# Patient Record
Sex: Male | Born: 1950
Health system: Southern US, Community
[De-identification: ages and names within clinical notes are randomized; demographics above are authoritative.]

## PROBLEM LIST (undated history)

## (undated) DIAGNOSIS — N529 Male erectile dysfunction, unspecified: Secondary | ICD-10-CM

## (undated) DIAGNOSIS — I10 Essential (primary) hypertension: Secondary | ICD-10-CM

## (undated) DIAGNOSIS — E785 Hyperlipidemia, unspecified: Secondary | ICD-10-CM

## (undated) HISTORY — DX: Hyperlipidemia, unspecified: E78.5

## (undated) HISTORY — DX: Male erectile dysfunction, unspecified: N52.9

## (undated) HISTORY — DX: Essential (primary) hypertension: I10

## (undated) HISTORY — PX: SHOULDER SURGERY: SHX246

---

## 2007-06-13 ENCOUNTER — Ambulatory Visit: Payer: Self-pay | Admitting: Family Medicine

## 2008-01-12 HISTORY — PX: UMBILICAL HERNIA REPAIR: SHX196

## 2008-04-11 ENCOUNTER — Encounter: Admission: RE | Admit: 2008-04-11 | Discharge: 2008-04-11 | Payer: Self-pay | Admitting: Surgery

## 2010-06-11 ENCOUNTER — Encounter: Payer: Self-pay | Admitting: Physician Assistant

## 2011-04-16 ENCOUNTER — Encounter: Payer: Self-pay | Admitting: Gastroenterology

## 2011-06-25 ENCOUNTER — Encounter: Payer: Self-pay | Admitting: Family Medicine

## 2011-06-25 ENCOUNTER — Other Ambulatory Visit: Payer: Self-pay

## 2011-06-25 ENCOUNTER — Ambulatory Visit (INDEPENDENT_AMBULATORY_CARE_PROVIDER_SITE_OTHER): Payer: BC Managed Care – PPO | Admitting: Family Medicine

## 2011-06-25 VITALS — BP 130/82 | HR 68 | Wt 188.0 lb

## 2011-06-25 DIAGNOSIS — M25511 Pain in right shoulder: Secondary | ICD-10-CM

## 2011-06-25 DIAGNOSIS — M25519 Pain in unspecified shoulder: Secondary | ICD-10-CM

## 2011-06-25 MED ORDER — CARISOPRODOL 350 MG PO TABS: 350.0000 mg | ORAL_TABLET | Freq: Four times a day (QID) | ORAL | Status: AC | PRN

## 2011-06-25 NOTE — Telephone Encounter (Signed)
Called soma in per jcl 

## 2011-06-25 NOTE — Patient Instructions (Addendum)
Heat to your shoulder area for 20 minutes 3 times a day. 4 ibuprofen 3 times per day. Will give you a muscle relaxer mainly to use at night

## 2011-06-25 NOTE — Progress Notes (Signed)
  Subjective:    Patient ID: Tony Hodges, male    DOB: 01-Mar-1950, 61 y.o.   MRN: [id number]  HPI He has a three-week history of right shoulder pain that he notes started after he was carrying a computer bag. The pain has continued. It bothers him more in the mornings when he gets up and he gives a 4/10. He has had previous difficulty with right shoulder pain and was last seen here in 2009. An injection was given ice and this and he got better   Review of Systems     Objective:   Physical Exam No palpable tenderness noted to the shoulder or upper back area. Normal motion of the shoulder. No laxity noted. Hawkins and Neer test is negative. Negative sulcus sign.      Assessment & Plan:   1. Right shoulder pain  carisoprodol (SOMA) 350 MG tablet  Heat to your shoulder area for 20 minutes 3 times a day. 4 ibuprofen 3 times per day. Will give you a muscle relaxer mainly to use at night If no better after approximately 10 days he will call me

## 2012-02-21 ENCOUNTER — Encounter: Payer: Self-pay | Admitting: Gastroenterology

## 2012-03-14 ENCOUNTER — Encounter: Payer: Self-pay | Admitting: Gastroenterology

## 2012-03-14 ENCOUNTER — Ambulatory Visit (AMBULATORY_SURGERY_CENTER): Payer: BC Managed Care – PPO | Admitting: *Deleted

## 2012-03-14 VITALS — Ht 72.0 in | Wt 195.0 lb

## 2012-03-14 DIAGNOSIS — Z1211 Encounter for screening for malignant neoplasm of colon: Secondary | ICD-10-CM

## 2012-03-14 MED ORDER — NA SULFATE-K SULFATE-MG SULF 17.5-3.13-1.6 GM/177ML PO SOLN: ORAL | Status: DC

## 2012-03-30 ENCOUNTER — Encounter: Payer: BC Managed Care – PPO | Admitting: Gastroenterology

## 2012-04-06 ENCOUNTER — Encounter: Payer: Self-pay | Admitting: *Deleted

## 2012-04-07 ENCOUNTER — Encounter: Payer: BC Managed Care – PPO | Admitting: Gastroenterology

## 2012-04-27 ENCOUNTER — Encounter: Payer: Self-pay | Admitting: Gastroenterology

## 2012-04-27 ENCOUNTER — Other Ambulatory Visit: Payer: Self-pay | Admitting: Gastroenterology

## 2012-04-27 ENCOUNTER — Ambulatory Visit (AMBULATORY_SURGERY_CENTER): Payer: BC Managed Care – PPO | Admitting: Gastroenterology

## 2012-04-27 VITALS — BP 133/83 | HR 74 | Temp 97.8°F | Resp 21 | Ht 72.0 in | Wt 195.0 lb

## 2012-04-27 DIAGNOSIS — Z1211 Encounter for screening for malignant neoplasm of colon: Secondary | ICD-10-CM

## 2012-04-27 HISTORY — PX: COLONOSCOPY: SHX174

## 2012-04-27 MED ORDER — SODIUM CHLORIDE 0.9 % IV SOLN: 500.0000 mL | INTRAVENOUS | Status: DC

## 2012-04-27 NOTE — Progress Notes (Signed)
Patient did not experience any of the following events: a burn prior to discharge; a fall within the facility; wrong site/side/patient/procedure/implant event; or a hospital transfer or hospital admission upon discharge from the facility. (G8907) Patient did not have preoperative order for IV antibiotic SSI prophylaxis. (G8918)  

## 2012-04-27 NOTE — Op Note (Signed)
[facility] [address] [place], [address]   COLONOSCOPY PROCEDURE REPORT  PATIENT: Tony Hodges, Tony Hodges  MR#: [id number] BIRTHDATE: March 12, 1950 , 61  yrs. old GENDER: Male ENDOSCOPIST: Louis Meckel, MD REFERRED [id number] Christell Constant, M.D. PROCEDURE DATE:  04/27/2012 PROCEDURE:   Colonoscopy, diagnostic ASA CLASS:   Class II INDICATIONS: MEDICATIONS: MAC sedation, administered by CRNA and propofol (Diprivan) 150mg  IV  DESCRIPTION OF PROCEDURE:   After the risks benefits and alternatives of the procedure were thoroughly explained, informed consent was obtained.  A digital rectal exam revealed no abnormalities of the rectum.   The LB [id number]  endoscope was introduced through the anus and advanced to the cecum, which was identified by both the appendix and ileocecal valve. No adverse events experienced.   The quality of the prep was Suprep excellent The instrument was then slowly withdrawn as the colon was fully examined.      COLON FINDINGS: A normal appearing cecum, ileocecal valve, and appendiceal orifice were identified.  The ascending, hepatic flexure, transverse, splenic flexure, descending, sigmoid colon and rectum appeared unremarkable.  No polyps or cancers were seen. Retroflexed views revealed no abnormalities. The time to cecum=2 minutes 12 seconds.  Withdrawal time=6 minutes 05 seconds.  The scope was withdrawn and the procedure completed. COMPLICATIONS: There were no complications.  ENDOSCOPIC IMPRESSION: Normal colon  RECOMMENDATIONS: Continue current colorectal screening recommendations for "routine risk" patients with a repeat colonoscopy in 10 years.   eSigned:  Louis Meckel, MD 04/27/2012 10:13 AM   cc:

## 2012-04-27 NOTE — Patient Instructions (Addendum)
Impressions/recommendations:  Normal colon  Repeat colonoscopy in 10 years.  YOU HAD AN ENDOSCOPIC PROCEDURE TODAY AT THE [facility]: Refer to the procedure report that was given to you for any specific questions about what was found during the examination.  If the procedure report does not answer your questions, please call your gastroenterologist to clarify.  If you requested that your care partner not be given the details of your procedure findings, then the procedure report has been included in a sealed envelope for you to review at your convenience later.  YOU SHOULD EXPECT: Some feelings of bloating in the abdomen. Passage of more gas than usual.  Walking can help get rid of the air that was put into your GI tract during the procedure and reduce the bloating. If you had a lower endoscopy (such as a colonoscopy or flexible sigmoidoscopy) you may notice spotting of blood in your stool or on the toilet paper. If you underwent a bowel prep for your procedure, then you may not have a normal bowel movement for a few days.  DIET: Your first meal following the procedure should be a light meal and then it is ok to progress to your normal diet.  A half-sandwich or bowl of soup is an example of a good first meal.  Heavy or fried foods are harder to digest and may make you feel nauseous or bloated.  Likewise meals heavy in dairy and vegetables can cause extra gas to form and this can also increase the bloating.  Drink plenty of fluids but you should avoid alcoholic beverages for 24 hours.  ACTIVITY: Your care partner should take you home directly after the procedure.  You should plan to take it easy, moving slowly for the rest of the day.  You can resume normal activity the day after the procedure however you should NOT DRIVE or use heavy machinery for 24 hours (because of the sedation medicines used during the test).    SYMPTOMS TO REPORT IMMEDIATELY: A gastroenterologist can be reached at  any hour.  During normal business hours, 8:30 AM to 5:00 PM Monday through Friday, call [phone].  After hours and on weekends, please call the GI answering service at [phone] who will take a message and have the physician on call contact you.   Following lower endoscopy (colonoscopy or flexible sigmoidoscopy):  Excessive amounts of blood in the stool  Significant tenderness or worsening of abdominal pains  Swelling of the abdomen that is new, acute  Fever of 100F or higher   FOLLOW UP: If any biopsies were taken you will be contacted by phone or by letter within the next 1-3 weeks.  Call your gastroenterologist if you have not heard about the biopsies in 3 weeks.  Our staff will call the home number listed on your records the next business day following your procedure to check on you and address any questions or concerns that you may have at that time regarding the information given to you following your procedure. This is a courtesy call and so if there is no answer at the home number and we have not heard from you through the emergency physician on call, we will assume that you have returned to your regular daily activities without incident.  SIGNATURES/CONFIDENTIALITY: You and/or your care partner have signed paperwork which will be entered into your electronic medical record.  These signatures attest to the fact that that the information above on your After Visit Summary has been   reviewed and is understood.  Full responsibility of the confidentiality of this discharge information lies with you and/or your care-partner. 

## 2012-04-27 NOTE — Progress Notes (Signed)
Lidocaine-40mg IV prior to Propofol InductionPropofol given over incremental dosages 

## 2012-05-01 ENCOUNTER — Telehealth: Payer: Self-pay | Admitting: *Deleted

## 2012-05-01 NOTE — Telephone Encounter (Signed)
  Follow up Call-  Call back number 04/27/2012  Post procedure Call Back phone  # [phone] hm  Permission to leave phone message Yes     Patient questions:  Do you have a fever, pain , or abdominal swelling? no Pain Score  0 *  Have you tolerated food without any problems? yes  Have you been able to return to your normal activities? yes  Do you have any questions about your discharge instructions: Diet   no Medications  no Follow up visit  no  Do you have questions or concerns about your Care? no  Actions: * If pain score is 4 or above: No action needed, pain <4.

## 2012-05-19 ENCOUNTER — Ambulatory Visit: Payer: Self-pay | Admitting: General Practice

## 2012-05-19 ENCOUNTER — Ambulatory Visit: Payer: Self-pay | Admitting: Family Medicine

## 2012-05-25 ENCOUNTER — Encounter: Payer: Self-pay | Admitting: Family Medicine

## 2012-05-25 ENCOUNTER — Ambulatory Visit (INDEPENDENT_AMBULATORY_CARE_PROVIDER_SITE_OTHER): Payer: BC Managed Care – PPO | Admitting: Family Medicine

## 2012-05-25 VITALS — BP 130/80 | HR 70 | Temp 97.7°F | Ht 72.0 in | Wt 187.0 lb

## 2012-05-25 DIAGNOSIS — E785 Hyperlipidemia, unspecified: Secondary | ICD-10-CM

## 2012-05-25 DIAGNOSIS — E1169 Type 2 diabetes mellitus with other specified complication: Secondary | ICD-10-CM | POA: Insufficient documentation

## 2012-05-25 DIAGNOSIS — E119 Type 2 diabetes mellitus without complications: Secondary | ICD-10-CM

## 2012-05-25 DIAGNOSIS — I10 Essential (primary) hypertension: Secondary | ICD-10-CM | POA: Insufficient documentation

## 2012-05-25 LAB — BASIC METABOLIC PANEL WITH GFR
BUN: 16 mg/dL (ref 6–23)
CO2: 27 mEq/L (ref 19–32)
Calcium: 9.1 mg/dL (ref 8.4–10.5)
Chloride: 102 mEq/L (ref 96–112)
Creat: 1.09 mg/dL (ref 0.50–1.35)
GFR, Est African American: 84 mL/min
GFR, Est Non African American: 73 mL/min
Glucose, Bld: 130 mg/dL — ABNORMAL HIGH (ref 70–99)
Potassium: 5 mEq/L (ref 3.5–5.3)
Sodium: 136 mEq/L (ref 135–145)

## 2012-05-25 LAB — HEPATIC FUNCTION PANEL
ALT: 14 U/L (ref 0–53)
AST: 16 U/L (ref 0–37)
Albumin: 4.3 g/dL (ref 3.5–5.2)
Alkaline Phosphatase: 53 U/L (ref 39–117)
Bilirubin, Direct: 0.1 mg/dL (ref 0.0–0.3)
Indirect Bilirubin: 0.4 mg/dL (ref 0.0–0.9)
Total Bilirubin: 0.5 mg/dL (ref 0.3–1.2)
Total Protein: 6.8 g/dL (ref 6.0–8.3)

## 2012-05-25 LAB — POCT GLYCOSYLATED HEMOGLOBIN (HGB A1C): Hemoglobin A1C: 6.9

## 2012-05-25 MED ORDER — SAXAGLIPTIN-METFORMIN ER 2.5-1000 MG PO TB24: 2.0000 | ORAL_TABLET | Freq: Every day | ORAL | Status: DC

## 2012-05-25 NOTE — Patient Instructions (Addendum)
      Dr Woodroe Mode Recommendations  Diet and Exercise discussed with patient.  For nutrition information, I recommend books:  1).Eat to Live by Dr Monico Hoar. 2).Prevent and Reverse Heart Disease by Dr Suzzette Righter. 3) Dr Lequita Asal Reversing Diabetes.  Www.PCRM.org  Exercise recommendations are:  If unable to walk, then the patient can exercise in a chair 3 times a day. By flapping arms like a bird gently and raising legs outwards to the front.  If ambulatory, the patient can go for walks for 30 minutes 3 times a week. Then increase the intensity and duration as tolerated.  Goal is to try to attain exercise frequency to 6 times a week.  If applicable: Best to perform resistance exercises (machines or weights) 2 days a week and cardio type exercises 3 days per week.

## 2012-05-25 NOTE — Progress Notes (Signed)
Patient ID: Tony Hodges, male   DOB: Oct 25, 1950, 62 y.o.   MRN: [id number] SUBJECTIVE: HPI: Patient is here for follow up of Diabetes Mellitus/HTN/hyperlipidemia  .Symptoms of DM:has had no Nocturia ,deniesUrinary Frequency ,denies Blurred vision ,deniesDizziness,denies.Dysuria,deniesparesthesias, deniesextremity pain or ulcers.Marland Kitchendenieschest pain. .has hadan annual eye exam. do check the feet. doescheck CBGs. Average CBG:________.Marland Kitchen deniesto episodes of hypoglycemia. doeshave an emergency hypoglycemic plan. admits toCompliance with medications. deniesProblems with medications.   PMH/PSH: reviewed/updated in Epic  SH/FH: reviewed/updated in Epic  Allergies: reviewed/updated in Epic  Medications: reviewed/updated in Epic  Immunizations: reviewed/updated in Epic  ROS: As above in the HPI. All other systems are stable or negative.  OBJECTIVE: APPEARANCE:  Patient in no acute distress.The patient appeared well nourished and normally developed. Acyanotic. Waist: VITAL SIGNS:  SKIN: warm and  Dry without overt rashes, tattoos and scars  HEAD and Neck: without JVD, Head and scalp: normal Eyes:No scleral icterus. Fundi normal, eye movements normal. Ears: Auricle normal, canal normal, Tympanic membranes normal, insufflation normal. Nose: normal Throat: normal Neck & thyroid: normal  CHEST & LUNGS: Chest wall: normal Lungs: Clear  CVS: Reveals the PMI to be normally located. Regular rhythm, First and Second Heart sounds are normal,  absence of murmurs, rubs or gallops. Peripheral vasculature: Radial pulses: normal Dorsal pedis pulses: normal Posterior pulses: normal  ABDOMEN:  Appearance: normal Benign,, no organomegaly, no masses, no Abdominal Aortic enlargement. No Guarding , no rebound. No Bruits. Bowel sounds: normal  RECTAL: N/A GU: N/A  EXTREMETIES: nonedematous. Both Femoral and Pedal pulses are normal.  MUSCULOSKELETAL:  Spine: normal Joints:  intact  NEUROLOGIC: oriented to time,place and person; nonfocal. Strength is normal Sensory is normal Reflexes are normal Cranial Nerves are normal.  ASSESSMENT:  HTN (hypertension) - Plan: BASIC METABOLIC PANEL WITH GFR  HLD (hyperlipidemia) - Plan: NMR Lipoprofile with Lipids, Hepatic function panel  DM (diabetes mellitus) - Plan: POCT glycosylated hemoglobin (Hb A1C), Saxagliptin-Metformin (KOMBIGLYZE XR) 2.05-998 MG TB24, CANCELED: POCT UA - Microalbumin   PLAN:       Dr Woodroe Mode Recommendations  Diet and Exercise discussed with patient.  For nutrition information, I recommend books:  1).Eat to Live by Dr Monico Hoar. 2).Prevent and Reverse Heart Disease by Dr Suzzette Righter. 3) Dr Lequita Asal Reversing Diabetes.  Www.PCRM.org  Exercise recommendations are:  If unable to walk, then the patient can exercise in a chair 3 times a day. By flapping arms like a bird gently and raising legs outwards to the front.  If ambulatory, the patient can go for walks for 30 minutes 3 times a week. Then increase the intensity and duration as tolerated.  Goal is to try to attain exercise frequency to 6 times a week.  If applicable: Best to perform resistance exercises (machines or weights) 2 days a week and cardio type exercises 3 days per week.  Orders Placed This Encounter  Procedures  . NMR Lipoprofile with Lipids  . BASIC METABOLIC PANEL WITH GFR  . Hepatic function panel  . POCT glycosylated hemoglobin (Hb A1C)   Results for orders placed in visit on 05/25/12  POCT GLYCOSYLATED HEMOGLOBIN (HGB A1C)      Result Value Range   Hemoglobin A1C 6.9 %     Meds ordered this encounter  Medications  . ACCU-CHEK AVIVA PLUS test strip    Sig:   . Saxagliptin-Metformin (KOMBIGLYZE XR) 2.05-998 MG TB24    Sig: Take 2 tablets by mouth at bedtime.    Dispense:  30 tablet  Refill:  11   RTC 3 months  Peterson Mathey P. [place]esto Charon, M.D.

## 2012-05-31 LAB — NMR LIPOPROFILE WITH LIPIDS
Cholesterol, Total: 96 mg/dL (ref <200)
HDL Particle Number: 30.2 umol/L — ABNORMAL LOW (ref >=30.5)
HDL Size: 8.6 nm — ABNORMAL LOW (ref >=9.2)
HDL-C: 35 mg/dL — ABNORMAL LOW (ref >=40)
LDL (calc): 44 mg/dL (ref <100)
LDL Particle Number: 732 nmol/L (ref <1000)
LDL Size: 20.6 nm (ref >20.5)
LP-IR Score: 54 — ABNORMAL HIGH (ref <=45)
Large HDL-P: 2 umol/L — ABNORMAL LOW (ref >=4.8)
Large VLDL-P: 1.3 nmol/L (ref <=2.7)
Small LDL Particle Number: 335 nmol/L (ref <=527)
Triglycerides: 83 mg/dL (ref <150)
VLDL Size: 45.9 nm (ref <=46.6)

## 2012-06-03 NOTE — Progress Notes (Signed)
Quick Note:  Lab result close to goal. No change in Medications for now. No Change in plans and follow up. To get the HGBA1C to goal, dietary changes would achieve that. Otherwise at the next visit we will have to increase the actos. ______

## 2012-06-06 ENCOUNTER — Encounter: Payer: Self-pay | Admitting: *Deleted

## 2012-06-20 ENCOUNTER — Other Ambulatory Visit: Payer: Self-pay | Admitting: Nurse Practitioner

## 2012-08-25 ENCOUNTER — Ambulatory Visit: Payer: BC Managed Care – PPO | Admitting: Family Medicine

## 2012-08-28 ENCOUNTER — Other Ambulatory Visit: Payer: Self-pay | Admitting: Nurse Practitioner

## 2012-08-29 NOTE — Telephone Encounter (Signed)
Last seen 05/25/12  FPW  Last glucose level 05/25/12

## 2012-09-05 ENCOUNTER — Encounter: Payer: Self-pay | Admitting: Family Medicine

## 2012-09-05 ENCOUNTER — Ambulatory Visit (INDEPENDENT_AMBULATORY_CARE_PROVIDER_SITE_OTHER): Payer: BC Managed Care – PPO | Admitting: Family Medicine

## 2012-09-05 VITALS — BP 127/81 | HR 66 | Temp 98.9°F | Wt 186.6 lb

## 2012-09-05 DIAGNOSIS — N529 Male erectile dysfunction, unspecified: Secondary | ICD-10-CM

## 2012-09-05 DIAGNOSIS — E119 Type 2 diabetes mellitus without complications: Secondary | ICD-10-CM

## 2012-09-05 DIAGNOSIS — I10 Essential (primary) hypertension: Secondary | ICD-10-CM

## 2012-09-05 DIAGNOSIS — E785 Hyperlipidemia, unspecified: Secondary | ICD-10-CM

## 2012-09-05 LAB — POCT UA - MICROALBUMIN: Microalbumin Ur, POC: NEGATIVE mg/L

## 2012-09-05 LAB — POCT GLYCOSYLATED HEMOGLOBIN (HGB A1C): Hemoglobin A1C: 6.7

## 2012-09-05 MED ORDER — PIOGLITAZONE HCL 15 MG PO TABS: ORAL_TABLET | ORAL | Status: DC

## 2012-09-05 MED ORDER — ATORVASTATIN CALCIUM 40 MG PO TABS: 40.0000 mg | ORAL_TABLET | Freq: Every day | ORAL | Status: DC

## 2012-09-05 MED ORDER — AMLODIPINE BESY-BENAZEPRIL HCL 5-10 MG PO CAPS: ORAL_CAPSULE | ORAL | Status: DC

## 2012-09-05 NOTE — Patient Instructions (Addendum)
      Dr Jereld Presti's Recommendations  For nutrition information, I recommend books:  1).Eat to Live by Dr Joel Fuhrman. 2).Prevent and Reverse Heart Disease by Dr Caldwell Esselstyn. 3) Dr Neal Barnard's Book:  Program to Reverse Diabetes  Exercise recommendations are:  If unable to walk, then the patient can exercise in a chair 3 times a day. By flapping arms like a bird gently and raising legs outwards to the front.  If ambulatory, the patient can go for walks for 30 minutes 3 times a week. Then increase the intensity and duration as tolerated.  Goal is to try to attain exercise frequency to 5 times a week.  If applicable: Best to perform resistance exercises (machines or weights) 2 days a week and cardio type exercises 3 days per week.  

## 2012-09-05 NOTE — Progress Notes (Signed)
Patient ID: Tony Hodges, male   DOB: Oct 14, 1950, 62 y.o.   MRN: [id number] SUBJECTIVE: CC: Chief Complaint  Patient presents with  . Follow-up    3 month follow up   . Medication Refill    needs refill on all meds     HPI: Patient is here for follow up of Diabetes Mellitus/HTN/HLD/ED Symptoms evaluated: Denies Nocturia ,Denies Urinary Frequency , denies Blurred vision ,deniesDizziness,denies.Dysuria,denies paresthesias, denies extremity pain or ulcers.Marland Kitchendenies chest pain. has had an annual eye exam. do check the feet. Does check CBGs. Average WUJ:[id number] Denies episodes of hypoglycemia. Does have an emergency hypoglycemic plan. admits toCompliance with medications. Denies Problems with medications.  Past Medical History  Diagnosis Date  . Erectile dysfunction   . Hyperlipidemia   . Hypertension   . Diabetes mellitus    Past Surgical History  Procedure Laterality Date  . Umbilical hernia repair  2010   History   Social History  . Marital Status: Married    Spouse Name: N/A    Number of Children: N/A  . Years of Education: N/A   Occupational History  . Not on file.   Social History Main Topics  . Smoking status: Former Smoker    Quit date: 09/05/1977  . Smokeless tobacco: Never Used  . Alcohol Use: 1.2 oz/week    2 Glasses of wine per week  . Drug Use: No  . Sexual Activity: Not on file   Other Topics Concern  . Not on file   Social History Narrative  . No narrative on file   Family History  Problem Relation Age of Onset  . Colon cancer Neg Hx   . Esophageal cancer Neg Hx   . Rectal cancer Neg Hx   . Stomach cancer Neg Hx    Current Outpatient Prescriptions on File Prior to Visit  Medication Sig Dispense Refill  . ACCU-CHEK AVIVA PLUS test strip       . amLODipine-benazepril (LOTREL) 5-10 MG per capsule TAKE ONE CAPSULE BY MOUTH EVERY DAY  30 capsule  0  . aspirin (LONGS ADULT LOW STRENGTH ASA) 81 MG EC tablet Take 81 mg by mouth daily.         Marland Kitchen atorvastatin (LIPITOR) 40 MG tablet Take 40 mg by mouth at bedtime.        . pioglitazone (ACTOS) 15 MG tablet TAKE 1 TABLET BY MOUTH DAILY  30 tablet  0  . Saxagliptin-Metformin (KOMBIGLYZE XR) 2.05-998 MG TB24 Take 2 tablets by mouth at bedtime.  30 tablet  11   No current facility-administered medications on file prior to visit.   No Known Allergies  There is no immunization history on file for this patient. Prior to Admission medications   Medication Sig Start Date End Date Taking? Authorizing Provider  ACCU-CHEK AVIVA PLUS test strip  03/06/12  Yes Historical Provider, MD  amLODipine-benazepril (LOTREL) 5-10 MG per capsule TAKE ONE CAPSULE BY MOUTH EVERY DAY 08/28/12  Yes Ileana Ladd, MD  aspirin (LONGS ADULT LOW STRENGTH ASA) 81 MG EC tablet Take 81 mg by mouth daily.     Yes Historical Provider, MD  atorvastatin (LIPITOR) 40 MG tablet Take 40 mg by mouth at bedtime.     Yes Historical Provider, MD  pioglitazone (ACTOS) 15 MG tablet TAKE 1 TABLET BY MOUTH DAILY 08/28/12  Yes Ileana Ladd, MD  Saxagliptin-Metformin (KOMBIGLYZE XR) 2.05-998 MG TB24 Take 2 tablets by mouth at bedtime. 05/25/12  Yes Ileana Ladd, MD  ROS: As above in the HPI. All other systems are stable or negative.  OBJECTIVE: APPEARANCE:  Patient in no acute distress.The patient appeared well nourished and normally developed. Acyanotic. Waist: VITAL SIGNS:BP 127/81  Pulse 66  Temp(Src) 98.9 F (37.2 C) (Oral)  Wt 186 lb 9.6 oz (84.641 kg)  BMI 25.3 kg/m2   SKIN: warm and  Dry without overt rashes, tattoos and scars  HEAD and Neck: without JVD, Head and scalp: normal Eyes:No scleral icterus. Fundi normal, eye movements normal. Ears: Auricle normal, canal normal, Tympanic membranes normal, insufflation normal. Nose: normal Throat: normal Neck & thyroid: normal  CHEST & LUNGS: Chest wall: normal Lungs: Clear  CVS: Reveals the PMI to be normally located. Regular rhythm, First and Second  Heart sounds are normal,  absence of murmurs, rubs or gallops. Peripheral vasculature: Radial pulses: normal Dorsal pedis pulses: normal Posterior pulses: normal  ABDOMEN:  Appearance: normal Benign, no organomegaly, no masses, no Abdominal Aortic enlargement. No Guarding , no rebound. No Bruits. Bowel sounds: normal  RECTAL: N/A GU: N/A  EXTREMETIES: nonedematous.  MUSCULOSKELETAL:  Spine: normal Joints: intact  NEUROLOGIC: oriented to time,place and person; nonfocal. Strength is normal Sensory is normal Reflexes are normal Cranial Nerves are normal.  ASSESSMENT: Diabetes mellitus - Plan: CMP14+EGFR, POCT glycosylated hemoglobin (Hb A1C), POCT UA - Microalbumin, pioglitazone (ACTOS) 15 MG tablet  Erectile dysfunction  HLD (hyperlipidemia) - Plan: CMP14+EGFR, NMR, lipoprofile, atorvastatin (LIPITOR) 40 MG tablet  HTN (hypertension) - Plan: CMP14+EGFR, amLODipine-benazepril (LOTREL) 5-10 MG per capsule   PLAN: Orders Placed This Encounter  Procedures  . CMP14+EGFR  . NMR, lipoprofile  . POCT glycosylated hemoglobin (Hb A1C)  . POCT UA - Microalbumin   Meds ordered this encounter  Medications  . pioglitazone (ACTOS) 15 MG tablet    Sig: TAKE 1 TABLET BY MOUTH DAILY    Dispense:  30 tablet    Refill:  11    3RD REQUEST  . amLODipine-benazepril (LOTREL) 5-10 MG per capsule    Sig: TAKE ONE CAPSULE BY MOUTH EVERY DAY    Dispense:  30 capsule    Refill:  11    Due for office visit  . atorvastatin (LIPITOR) 40 MG tablet    Sig: Take 1 tablet (40 mg total) by mouth at bedtime.    Dispense:  30 tablet    Refill:  11        Dr Woodroe Mode Recommendations  For nutrition information, I recommend books:  1).Eat to Live by Dr Monico Hoar. 2).Prevent and Reverse Heart Disease by Dr Suzzette Righter. 3) Dr Katherina Right Book:  Program to Reverse Diabetes  Exercise recommendations are:  If unable to walk, then the patient can exercise in a chair 3 times  a day. By flapping arms like a bird gently and raising legs outwards to the front.  If ambulatory, the patient can go for walks for 30 minutes 3 times a week. Then increase the intensity and duration as tolerated.  Goal is to try to attain exercise frequency to 5 times a week.  If applicable: Best to perform resistance exercises (machines or weights) 2 days a week and cardio type exercises 3 days per week.  Return in about 4 months (around 01/05/2013) for Recheck medical problems.  Jaxan Michel P. [place] Charon, M.D.

## 2012-09-07 LAB — CMP14+EGFR
ALT: 17 IU/L (ref 0–44)
AST: 16 IU/L (ref 0–40)
Albumin/Globulin Ratio: 1.8 (ref 1.1–2.5)
Albumin: 4.7 g/dL (ref 3.6–4.8)
Alkaline Phosphatase: 67 IU/L (ref 39–117)
BUN/Creatinine Ratio: 11 (ref 10–22)
BUN: 11 mg/dL (ref 8–27)
CO2: 27 mmol/L (ref 18–29)
Calcium: 9.8 mg/dL (ref 8.6–10.2)
Chloride: 98 mmol/L (ref 97–108)
Creatinine, Ser: 0.98 mg/dL (ref 0.76–1.27)
GFR calc Af Amer: 96 mL/min/{1.73_m2} (ref >59)
GFR calc non Af Amer: 83 mL/min/{1.73_m2} (ref >59)
Globulin, Total: 2.6 g/dL (ref 1.5–4.5)
Glucose: 112 mg/dL — ABNORMAL HIGH (ref 65–99)
Potassium: 4.8 mmol/L (ref 3.5–5.2)
Sodium: 141 mmol/L (ref 134–144)
Total Bilirubin: 0.4 mg/dL (ref 0.0–1.2)
Total Protein: 7.3 g/dL (ref 6.0–8.5)

## 2012-09-07 LAB — NMR, LIPOPROFILE
Cholesterol: 106 mg/dL (ref <200)
HDL Cholesterol by NMR: 47 mg/dL (ref >=40)
HDL Particle Number: 32.7 umol/L (ref >=30.5)
LDL Particle Number: 740 nmol/L (ref <1000)
LDL Size: 19.9 nm — ABNORMAL LOW (ref >20.5)
LDLC SERPL CALC-MCNC: 44 mg/dL (ref <100)
LP-IR Score: 49 — ABNORMAL HIGH (ref <=45)
Small LDL Particle Number: 573 nmol/L — ABNORMAL HIGH (ref <=527)
Triglycerides by NMR: 77 mg/dL (ref <150)

## 2012-09-26 ENCOUNTER — Other Ambulatory Visit: Payer: Self-pay | Admitting: Nurse Practitioner

## 2012-10-02 ENCOUNTER — Other Ambulatory Visit: Payer: Self-pay | Admitting: Family Medicine

## 2012-10-04 ENCOUNTER — Other Ambulatory Visit: Payer: Self-pay | Admitting: Family Medicine

## 2012-11-16 ENCOUNTER — Other Ambulatory Visit: Payer: Self-pay

## 2013-01-09 ENCOUNTER — Ambulatory Visit (INDEPENDENT_AMBULATORY_CARE_PROVIDER_SITE_OTHER): Payer: BC Managed Care – PPO | Admitting: Family Medicine

## 2013-01-09 ENCOUNTER — Encounter: Payer: Self-pay | Admitting: Family Medicine

## 2013-01-09 VITALS — BP 117/71 | HR 65 | Temp 97.8°F | Ht 72.0 in | Wt 188.8 lb

## 2013-01-09 DIAGNOSIS — N529 Male erectile dysfunction, unspecified: Secondary | ICD-10-CM

## 2013-01-09 DIAGNOSIS — E119 Type 2 diabetes mellitus without complications: Secondary | ICD-10-CM

## 2013-01-09 DIAGNOSIS — I10 Essential (primary) hypertension: Secondary | ICD-10-CM

## 2013-01-09 DIAGNOSIS — E785 Hyperlipidemia, unspecified: Secondary | ICD-10-CM

## 2013-01-09 DIAGNOSIS — Z23 Encounter for immunization: Secondary | ICD-10-CM

## 2013-01-09 LAB — POCT GLYCOSYLATED HEMOGLOBIN (HGB A1C): Hemoglobin A1C: 6.6

## 2013-01-09 NOTE — Progress Notes (Signed)
Patient ID: Tony Hodges, male   DOB: 11/08/1950, 63 y.o.   MRN: [id number] SUBJECTIVE: CC: Chief Complaint  Patient presents with  . Follow-up    4 month follow up     HPI: Patient is here for follow up of Diabetes Mellitus/HTN/HLD: Symptoms evaluated: Denies Nocturia ,Denies Urinary Frequency , denies Blurred vision ,deniesDizziness,denies.Dysuria,denies paresthesias, denies extremity pain or ulcers.Marland Kitchendenies chest pain. has had an annual eye exam. do check the feet. Does check CBGs. Average CBG:128 Denies episodes of hypoglycemia. Does have an emergency hypoglycemic plan. admits toCompliance with medications. Denies Problems with medications.  Reading the book Eat to Live.starting to improve diet.  No complaints.  Past Medical History  Diagnosis Date  . Erectile dysfunction   . Hyperlipidemia   . Hypertension   . Diabetes mellitus    Past Surgical History  Procedure Laterality Date  . Umbilical hernia repair  2010   History   Social History  . Marital Status: Married    Spouse Name: N/A    Number of Children: N/A  . Years of Education: N/A   Occupational History  . Not on file.   Social History Main Topics  . Smoking status: Former Smoker    Quit date: 09/05/1977  . Smokeless tobacco: Never Used  . Alcohol Use: 1.2 oz/week    2 Glasses of wine per week  . Drug Use: No  . Sexual Activity: Not on file   Other Topics Concern  . Not on file   Social History Narrative  . No narrative on file   Family History  Problem Relation Age of Onset  . Colon cancer Neg Hx   . Esophageal cancer Neg Hx   . Rectal cancer Neg Hx   . Stomach cancer Neg Hx    Current Outpatient Prescriptions on File Prior to Visit  Medication Sig Dispense Refill  . ACCU-CHEK AVIVA PLUS test strip       . amLODipine-benazepril (LOTREL) 5-10 MG per capsule TAKE ONE CAPSULE BY MOUTH EVERY DAY  30 capsule  11  . aspirin (LONGS ADULT LOW STRENGTH ASA) 81 MG EC tablet Take 81 mg by  mouth daily.        . pioglitazone (ACTOS) 15 MG tablet TAKE 1 TABLET BY MOUTH DAILY  30 tablet  11  . Saxagliptin-Metformin (KOMBIGLYZE XR) 2.05-998 MG TB24 Take 2 tablets by mouth at bedtime.  30 tablet  11   No current facility-administered medications on file prior to visit.   No Known Allergies Immunization History  Administered Date(s) Administered  . Influenza,inj,Quad PF,36+ Mos 01/09/2013  . Td 12/12/2007   Prior to Admission medications   Medication Sig Start Date End Date Taking? Authorizing Provider  ACCU-CHEK AVIVA PLUS test strip  03/06/12   Historical Provider, MD  amLODipine-benazepril (LOTREL) 5-10 MG per capsule TAKE ONE CAPSULE BY MOUTH EVERY DAY 09/05/12   Ileana Ladd, MD  aspirin (LONGS ADULT LOW STRENGTH ASA) 81 MG EC tablet Take 81 mg by mouth daily.      Historical Provider, MD  atorvastatin (LIPITOR) 40 MG tablet Take 20 mg by mouth. 09/05/12   Ileana Ladd, MD  pioglitazone (ACTOS) 15 MG tablet TAKE 1 TABLET BY MOUTH DAILY 09/05/12   Ileana Ladd, MD  Saxagliptin-Metformin (KOMBIGLYZE XR) 2.05-998 MG TB24 Take 2 tablets by mouth at bedtime. 05/25/12   Ileana Ladd, MD     ROS: As above in the HPI. All other systems are stable or negative.  OBJECTIVE:  APPEARANCE:  Patient in no acute distress.The patient appeared well nourished and normally developed. Acyanotic. Waist: VITAL SIGNS:BP 117/71  Pulse 65  Temp(Src) 97.8 F (36.6 C) (Oral)  Ht 6' (1.829 m)  Wt 188 lb 12.8 oz (85.639 kg)  BMI 25.60 kg/m2 WM  SKIN: warm and  Dry without overt rashes, tattoos and scars  HEAD and Neck: without JVD, Head and scalp: normal Eyes:No scleral icterus. Fundi normal, eye movements normal. Ears: Auricle normal, canal normal, Tympanic membranes normal, insufflation normal. Nose: normal Throat: normal Neck & thyroid: normal  CHEST & LUNGS: Chest wall: normal Lungs: Clear  CVS: Reveals the PMI to be normally located. Regular rhythm, First and Second  Heart sounds are normal,  absence of murmurs, rubs or gallops. Peripheral vasculature: Radial pulses: normal Dorsal pedis pulses: normal Posterior pulses: normal  ABDOMEN:  Appearance: normal Benign, no organomegaly, no masses, no Abdominal Aortic enlargement. No Guarding , no rebound. No Bruits. Bowel sounds: normal  RECTAL: N/A GU: N/A  EXTREMETIES: nonedematous.  MUSCULOSKELETAL:  Spine: normal Joints: intact  NEUROLOGIC: oriented to time,place and person; nonfocal. Strength is normal Sensory is normal Reflexes are normal Cranial Nerves are normal.  ASSESSMENT:  Erectile dysfunction  HTN (hypertension) - Plan: CMP14+EGFR  HLD (hyperlipidemia) - Plan: CMP14+EGFR, NMR, lipoprofile  Diabetes mellitus - Plan: POCT glycosylated hemoglobin (Hb A1C)  Need for prophylactic vaccination and inoculation against influenza  PLAN:      Dr Woodroe Mode Recommendations  For nutrition information, I recommend books:  1).Eat to Live by Dr Monico Hoar. 2).Prevent and Reverse Heart Disease by Dr Suzzette Righter. 3) Dr Katherina Right Book:  Program to Reverse Diabetes  Exercise recommendations are:  If unable to walk, then the patient can exercise in a chair 3 times a day. By flapping arms like a bird gently and raising legs outwards to the front.  If ambulatory, the patient can go for walks for 30 minutes 3 times a week. Then increase the intensity and duration as tolerated.  Goal is to try to attain exercise frequency to 5 times a week.  If applicable: Best to perform resistance exercises (machines or weights) 2 days a week and cardio type exercises 3 days per week.   DM foot care Orders Placed This Encounter  Procedures  . CMP14+EGFR  . NMR, lipoprofile  . POCT glycosylated hemoglobin (Hb A1C)   Meds ordered this encounter  Medications  . atorvastatin (LIPITOR) 40 MG tablet    Sig: Take 20 mg by mouth.   Medications Discontinued During This Encounter   Medication Reason  . pioglitazone (ACTOS) 15 MG tablet   . atorvastatin (LIPITOR) 40 MG tablet    Return in about 4 months (around 05/10/2013) for Recheck medical problems.  Amiri Riechers P. Modesto Charon, M.D.

## 2013-01-09 NOTE — Patient Instructions (Signed)
Diabetes and Foot Care Diabetes may cause you to have problems because of poor blood supply (circulation) to your feet and legs. This may cause the skin on your feet to become thinner, break easier, and heal more slowly. Your skin may become dry, and the skin may peel and crack. You may also have nerve damage in your legs and feet causing decreased feeling in them. You may not notice minor injuries to your feet that could lead to infections or more serious problems. Taking care of your feet is one of the most important things you can do for yourself.  HOME CARE INSTRUCTIONS  Wear shoes at all times, even in the house. Do not go barefoot. Bare feet are easily injured.  Check your feet daily for blisters, cuts, and redness. If you cannot see the bottom of your feet, use a mirror or ask someone for help.  Wash your feet with warm water (do not use hot water) and mild soap. Then pat your feet and the areas between your toes until they are completely dry. Do not soak your feet as this can dry your skin.  Apply a moisturizing lotion or petroleum jelly (that does not contain alcohol and is unscented) to the skin on your feet and to dry, brittle toenails. Do not apply lotion between your toes.  Trim your toenails straight across. Do not dig under them or around the cuticle. File the edges of your nails with an emery board or nail file.  Do not cut corns or calluses or try to remove them with medicine.  Wear clean socks or stockings every day. Make sure they are not too tight. Do not wear knee-high stockings since they may decrease blood flow to your legs.  Wear shoes that fit properly and have enough cushioning. To break in new shoes, wear them for just a few hours a day. This prevents you from injuring your feet. Always look in your shoes before you put them on to be sure there are no objects inside.  Do not cross your legs. This may decrease the blood flow to your feet.  If you find a minor scrape,  cut, or break in the skin on your feet, keep it and the skin around it clean and dry. These areas may be cleansed with mild soap and water. Do not cleanse the area with peroxide, alcohol, or iodine.  When you remove an adhesive bandage, be sure not to damage the skin around it.  If you have a wound, look at it several times a day to make sure it is healing.  Do not use heating pads or hot water bottles. They may burn your skin. If you have lost feeling in your feet or legs, you may not know it is happening until it is too late.  Make sure your health care provider performs a complete foot exam at least annually or more often if you have foot problems. Report any cuts, sores, or bruises to your health care provider immediately. SEEK MEDICAL CARE IF:   You have an injury that is not healing.  You have cuts or breaks in the skin.  You have an ingrown nail.  You notice redness on your legs or feet.  You feel burning or tingling in your legs or feet.  You have pain or cramps in your legs and feet.  Your legs or feet are numb.  Your feet always feel cold. SEEK IMMEDIATE MEDICAL CARE IF:   There is increasing redness,   swelling, or pain in or around a wound.  There is a red line that goes up your leg.  Pus is coming from a wound.  You develop a fever or as directed by your health care provider.  You notice a bad smell coming from an ulcer or wound. Document Released: 12/26/1999 Document Revised: 08/30/2012 Document Reviewed: 06/06/2012 [employer] Patient Information 2014 [employer].  

## 2013-01-11 LAB — CMP14+EGFR
ALT: 11 IU/L (ref 0–44)
AST: 21 IU/L (ref 0–40)
Albumin/Globulin Ratio: 1.8 (ref 1.1–2.5)
Albumin: 4.2 g/dL (ref 3.6–4.8)
Alkaline Phosphatase: 53 IU/L (ref 39–117)
BUN/Creatinine Ratio: 14 (ref 10–22)
BUN: 15 mg/dL (ref 8–27)
CO2: 27 mmol/L (ref 18–29)
Calcium: 9.3 mg/dL (ref 8.6–10.2)
Chloride: 100 mmol/L (ref 97–108)
Creatinine, Ser: 1.08 mg/dL (ref 0.76–1.27)
GFR calc Af Amer: 85 mL/min/{1.73_m2} (ref >59)
GFR calc non Af Amer: 74 mL/min/{1.73_m2} (ref >59)
Globulin, Total: 2.4 g/dL (ref 1.5–4.5)
Glucose: 124 mg/dL — ABNORMAL HIGH (ref 65–99)
Potassium: 4.9 mmol/L (ref 3.5–5.2)
Sodium: 140 mmol/L (ref 134–144)
Total Bilirubin: 0.5 mg/dL (ref 0.0–1.2)
Total Protein: 6.6 g/dL (ref 6.0–8.5)

## 2013-01-11 LAB — NMR, LIPOPROFILE
Cholesterol: 122 mg/dL (ref <200)
HDL Cholesterol by NMR: 50 mg/dL (ref >=40)
HDL Particle Number: 36.2 umol/L (ref >=30.5)
LDL Particle Number: 959 nmol/L (ref <1000)
LDL Size: 20.3 nm — ABNORMAL LOW (ref >20.5)
LDLC SERPL CALC-MCNC: 61 mg/dL (ref <100)
LP-IR Score: 33 (ref <=45)
Small LDL Particle Number: 724 nmol/L — ABNORMAL HIGH (ref <=527)
Triglycerides by NMR: 54 mg/dL (ref <150)

## 2013-05-11 ENCOUNTER — Encounter: Payer: Self-pay | Admitting: Family Medicine

## 2013-05-11 ENCOUNTER — Ambulatory Visit (INDEPENDENT_AMBULATORY_CARE_PROVIDER_SITE_OTHER): Payer: Managed Care, Other (non HMO) | Admitting: Family Medicine

## 2013-05-11 VITALS — BP 113/72 | HR 77 | Temp 99.0°F | Ht 72.0 in | Wt 188.0 lb

## 2013-05-11 DIAGNOSIS — N529 Male erectile dysfunction, unspecified: Secondary | ICD-10-CM

## 2013-05-11 DIAGNOSIS — E119 Type 2 diabetes mellitus without complications: Secondary | ICD-10-CM

## 2013-05-11 DIAGNOSIS — I10 Essential (primary) hypertension: Secondary | ICD-10-CM

## 2013-05-11 DIAGNOSIS — E785 Hyperlipidemia, unspecified: Secondary | ICD-10-CM

## 2013-05-11 LAB — POCT GLYCOSYLATED HEMOGLOBIN (HGB A1C): Hemoglobin A1C: 7.3

## 2013-05-11 MED ORDER — SAXAGLIPTIN-METFORMIN ER 2.5-1000 MG PO TB24: 2.0000 | ORAL_TABLET | Freq: Every day | ORAL | Status: DC

## 2013-05-11 NOTE — Patient Instructions (Signed)
Dr Paula Libra Recommendations  For nutrition information, I recommend books:  1).Eat to Live by Dr Excell Seltzer. 2).Prevent and Reverse Heart Disease by Dr Karl Luke. 3) Dr Janene Harvey Book:  Program to Reverse Diabetes  Exercise recommendations are:  If unable to walk, then the patient can exercise in a chair 3 times a day. By flapping arms like a bird gently and raising legs outwards to the front.  If ambulatory, the patient can go for walks for 30 minutes 3 times a week. Then increase the intensity and duration as tolerated.  Goal is to try to attain exercise frequency to 5 times a week.  If applicable: Best to perform resistance exercises (machines or weights) 2 days a week and cardio type exercises 3 days per week.   DASH Diet The DASH diet stands for "Dietary Approaches to Stop Hypertension." It is a healthy eating plan that has been shown to reduce high blood pressure (hypertension) in as little as 14 days, while also possibly providing other significant health benefits. These other health benefits include reducing the risk of breast cancer after menopause and reducing the risk of type 2 diabetes, heart disease, colon cancer, and stroke. Health benefits also include weight loss and slowing kidney failure in patients with chronic kidney disease.  DIET GUIDELINES  Limit salt (sodium). Your diet should contain less than 1500 mg of sodium daily.  Limit refined or processed carbohydrates. Your diet should include mostly whole grains. Desserts and added sugars should be used sparingly.  Include small amounts of heart-healthy fats. These types of fats include nuts, oils, and tub margarine. Limit saturated and trans fats. These fats have been shown to be harmful in the body. CHOOSING FOODS  The following food groups are based on a 2000 calorie diet. See your Registered Dietitian for individual calorie needs. Grains and Grain Products (6 to 8 servings daily)  Eat  More Often: Whole-wheat bread, brown rice, whole-grain or wheat pasta, quinoa, popcorn without added fat or salt (air popped).  Eat Less Often: White bread, white pasta, white rice, cornbread. Vegetables (4 to 5 servings daily)  Eat More Often: Fresh, frozen, and canned vegetables. Vegetables may be raw, steamed, roasted, or grilled with a minimal amount of fat.  Eat Less Often/Avoid: Creamed or fried vegetables. Vegetables in a cheese sauce. Fruit (4 to 5 servings daily)  Eat More Often: All fresh, canned (in natural juice), or frozen fruits. Dried fruits without added sugar. One hundred percent fruit juice ( cup [237 mL] daily).  Eat Less Often: Dried fruits with added sugar. Canned fruit in light or heavy syrup. [employer], Fish, and Poultry (2 servings or less daily. One serving is 3 to 4 oz [85-114 g]).  Eat More Often: Ninety percent or leaner ground beef, tenderloin, sirloin. Round cuts of beef, chicken breast, [place] breast. All fish. Grill, bake, or broil your meat. Nothing should be fried.  Eat Less Often/Avoid: Fatty cuts of meat, [place], or chicken leg, thigh, or wing. Fried cuts of meat or fish. Dairy (2 to 3 servings)  Eat More Often: Low-fat or fat-free milk, low-fat plain or light yogurt, reduced-fat or part-skim cheese.  Eat Less Often/Avoid: Milk (whole, 2%).Whole milk yogurt. Full-fat cheeses. Nuts, Seeds, and Legumes (4 to 5 servings per week)  Eat More Often: All without added salt.  Eat Less Often/Avoid: Salted nuts and seeds, canned beans with added salt. Fats and Sweets (limited)  Eat More Often: Vegetable oils, tub  margarines without trans fats, sugar-free gelatin. Mayonnaise and salad dressings.  Eat Less Often/Avoid: Coconut oils, palm oils, butter, stick margarine, cream, half and half, cookies, candy, pie. FOR MORE INFORMATION The Dash Diet Eating Plan: www.dashdiet.org Document Released: 12/17/2010 Document Revised: 03/22/2011 Document Reviewed:  12/17/2010 [facility] Patient Information 2014 [place], [place].   Diabetes and Foot Care Diabetes may cause you to have problems because of poor blood supply (circulation) to your feet and legs. This may cause the skin on your feet to become thinner, break easier, and heal more slowly. Your skin may become dry, and the skin may peel and crack. You may also have nerve damage in your legs and feet causing decreased feeling in them. You may not notice minor injuries to your feet that could lead to infections or more serious problems. Taking care of your feet is one of the most important things you can do for yourself.  HOME CARE INSTRUCTIONS  Wear shoes at all times, even in the house. Do not go barefoot. Bare feet are easily injured.  Check your feet daily for blisters, cuts, and redness. If you cannot see the bottom of your feet, use a mirror or ask someone for help.  Wash your feet with warm water (do not use hot water) and mild soap. Then pat your feet and the areas between your toes until they are completely dry. Do not soak your feet as this can dry your skin.  Apply a moisturizing lotion or petroleum jelly (that does not contain alcohol and is unscented) to the skin on your feet and to dry, brittle toenails. Do not apply lotion between your toes.  Trim your toenails straight across. Do not dig under them or around the cuticle. File the edges of your nails with an emery board or nail file.  Do not cut corns or calluses or try to remove them with medicine.  Wear clean socks or stockings every day. Make sure they are not too tight. Do not wear knee-high stockings since they may decrease blood flow to your legs.  Wear shoes that fit properly and have enough cushioning. To break in new shoes, wear them for just a few hours a day. This prevents you from injuring your feet. Always look in your shoes before you put them on to be sure there are no objects inside.  Do not cross your legs. This may  decrease the blood flow to your feet.  If you find a minor scrape, cut, or break in the skin on your feet, keep it and the skin around it clean and dry. These areas may be cleansed with mild soap and water. Do not cleanse the area with peroxide, alcohol, or iodine.  When you remove an adhesive bandage, be sure not to damage the skin around it.  If you have a wound, look at it several times a day to make sure it is healing.  Do not use heating pads or hot water bottles. They may burn your skin. If you have lost feeling in your feet or legs, you may not know it is happening until it is too late.  Make sure your health care provider performs a complete foot exam at least annually or more often if you have foot problems. Report any cuts, sores, or bruises to your health care provider immediately. SEEK MEDICAL CARE IF:   You have an injury that is not healing.  You have cuts or breaks in the skin.  You have an ingrown nail.  You notice redness on your legs or feet.  You feel burning or tingling in your legs or feet.  You have pain or cramps in your legs and feet.  Your legs or feet are numb.  Your feet always feel cold. SEEK IMMEDIATE MEDICAL CARE IF:   There is increasing redness, swelling, or pain in or around a wound.  There is a red line that goes up your leg.  Pus is coming from a wound.  You develop a fever or as directed by your health care provider.  You notice a bad smell coming from an ulcer or wound. Document Released: 12/26/1999 Document Revised: 08/30/2012 Document Reviewed: 06/06/2012 [facility], [facility], [facility] Patient Information 2014 [place].

## 2013-05-11 NOTE — Progress Notes (Signed)
Patient ID: Tony Hodges, male   DOB: May 20, 1950, 63 y.o.   MRN: [id number] SUBJECTIVE: CC: Chief Complaint  Patient presents with  . Medical Management of Chronic Issues    diabetes    HPI:  Patient is here for follow up of Diabetes Mellitus/HTN/HLD: Symptoms evaluated: Denies Nocturia ,Denies Urinary Frequency , denies Blurred vision ,deniesDizziness,denies.Dysuria,denies paresthesias, denies extremity pain or ulcers.Marland Kitchendenies chest pain. has had an annual eye exam. do check the feet. Does check CBGs. Average CBG:155 this am Denies episodes of hypoglycemia. Does have an emergency hypoglycemic plan. admits toCompliance with medications. Denies Problems with medications.   Past Medical History  Diagnosis Date  . Erectile dysfunction   . Hyperlipidemia   . Hypertension   . Diabetes mellitus    Past Surgical History  Procedure Laterality Date  . Umbilical hernia repair  2010   History   Social History  . Marital Status: Married    Spouse Name: N/A    Number of Children: N/A  . Years of Education: N/A   Occupational History  . Not on file.   Social History Main Topics  . Smoking status: Former Smoker    Quit date: 09/05/1977  . Smokeless tobacco: Never Used  . Alcohol Use: 1.2 oz/week    2 Glasses of wine per week  . Drug Use: No  . Sexual Activity: Not on file   Other Topics Concern  . Not on file   Social History Narrative  . No narrative on file   Family History  Problem Relation Age of Onset  . Colon cancer Neg Hx   . Esophageal cancer Neg Hx   . Rectal cancer Neg Hx   . Stomach cancer Neg Hx    Current Outpatient Prescriptions on File Prior to Visit  Medication Sig Dispense Refill  . ACCU-CHEK AVIVA PLUS test strip       . amLODipine-benazepril (LOTREL) 5-10 MG per capsule TAKE ONE CAPSULE BY MOUTH EVERY DAY  30 capsule  11  . aspirin (LONGS ADULT LOW STRENGTH ASA) 81 MG EC tablet Take 81 mg by mouth daily.        Marland Kitchen atorvastatin (LIPITOR) 40 MG  tablet Take 20 mg by mouth.      . pioglitazone (ACTOS) 15 MG tablet TAKE 1 TABLET BY MOUTH DAILY  30 tablet  11   No current facility-administered medications on file prior to visit.   No Known Allergies Immunization History  Administered Date(s) Administered  . Influenza,inj,Quad PF,36+ Mos 01/09/2013  . Td 12/12/2007   Prior to Admission medications   Medication Sig Start Date End Date Taking? Authorizing Provider  ACCU-CHEK AVIVA PLUS test strip  03/06/12  Yes Historical Provider, MD  amLODipine-benazepril (LOTREL) 5-10 MG per capsule TAKE ONE CAPSULE BY MOUTH EVERY DAY 09/05/12  Yes Vernie Shanks, MD  aspirin (LONGS ADULT LOW STRENGTH ASA) 81 MG EC tablet Take 81 mg by mouth daily.     Yes Historical Provider, MD  atorvastatin (LIPITOR) 40 MG tablet Take 20 mg by mouth. 09/05/12  Yes Vernie Shanks, MD  pioglitazone (ACTOS) 15 MG tablet TAKE 1 TABLET BY MOUTH DAILY 09/05/12  Yes Vernie Shanks, MD  Saxagliptin-Metformin (KOMBIGLYZE XR) 2.05-998 MG TB24 Take 2 tablets by mouth at bedtime. 05/25/12  Yes Vernie Shanks, MD     ROS: As above in the HPI. All other systems are stable or negative.  OBJECTIVE: APPEARANCE:  Patient in no acute distress.The patient appeared well nourished and normally  developed. Acyanotic. Waist: VITAL SIGNS:BP 113/72  Pulse 77  Temp(Src) 99 F (37.2 C) (Oral)  Ht 6' (1.829 m)  Wt 188 lb (85.276 kg)  BMI 25.49 kg/m2   SKIN: warm and  Dry without overt rashes, tattoos and scars  HEAD and Neck: without JVD, Head and scalp: normal Eyes:No scleral icterus. Fundi normal, eye movements normal. Ears: Auricle normal, canal normal, Tympanic membranes normal, insufflation normal. Nose: normal Throat: normal Neck & thyroid: normal  CHEST & LUNGS: Chest wall: normal Lungs: Clear  CVS: Reveals the PMI to be normally located. Regular rhythm, First and Second Heart sounds are normal,  absence of murmurs, rubs or gallops. Peripheral  vasculature: Radial pulses: normal Dorsal pedis pulses: normal Posterior pulses: normal  ABDOMEN:  Appearance: normal Benign, no organomegaly, no masses, no Abdominal Aortic enlargement. No Guarding , no rebound. No Bruits. Bowel sounds: normal  RECTAL: N/A GU: N/A  EXTREMETIES: nonedematous.  MUSCULOSKELETAL:  Spine: normal Joints: intact  NEUROLOGIC: oriented to time,place and person; nonfocal. Strength is normal Sensory is normal Reflexes are normal Cranial Nerves are normal.  ASSESSMENT: HTN (hypertension) - Plan: CMP14+EGFR, EKG 12-Lead  HLD (hyperlipidemia) - Plan: Lipid panel  Diabetes mellitus - Plan: POCT glycosylated hemoglobin (Hb A1C), EKG 12-Lead  Erectile dysfunction  DM (diabetes mellitus) - Plan: Saxagliptin-Metformin (KOMBIGLYZE XR) 2.05-998 MG TB24  PLAN:      Dr Paula Libra Recommendations  For nutrition information, I recommend books:  1).Eat to Live by Dr Excell Seltzer. 2).Prevent and Reverse Heart Disease by Dr Karl Luke. 3) Dr Janene Harvey Book:  Program to Reverse Diabetes  Exercise recommendations are:  If unable to walk, then the patient can exercise in a chair 3 times a day. By flapping arms like a bird gently and raising legs outwards to the front.  If ambulatory, the patient can go for walks for 30 minutes 3 times a week. Then increase the intensity and duration as tolerated.  Goal is to try to attain exercise frequency to 5 times a week.  If applicable: Best to perform resistance exercises (machines or weights) 2 days a week and cardio type exercises 3 days per week.  Dash diet DM footcare in the AVS  Orders Placed This Encounter  Procedures  . CMP14+EGFR  . Lipid panel  . POCT glycosylated hemoglobin (Hb A1C)  . EKG 12-Lead  EKG : normal.  If any chest pain or anginal equivalent symptoms, patient to call 911 or proceed to ED Otherwise start on increased physical activities for exercise 30 minutes 5 x a  day.  Meds ordered this encounter  Medications  . Saxagliptin-Metformin (KOMBIGLYZE XR) 2.05-998 MG TB24    Sig: Take 2 tablets by mouth at bedtime.    Dispense:  30 tablet    Refill:  11   Medications Discontinued During This Encounter  Medication Reason  . Saxagliptin-Metformin (KOMBIGLYZE XR) 2.05-998 MG TB24 Reorder   Return in about 4 months (around 09/11/2013) for Recheck medical problems.  Audelia Knape P. Jacelyn Grip, M.D.

## 2013-05-12 LAB — LIPID PANEL
Chol/HDL Ratio: 2.6 ratio units (ref 0.0–5.0)
Cholesterol, Total: 119 mg/dL (ref 100–199)
HDL: 45 mg/dL (ref >39)
LDL Calculated: 61 mg/dL (ref 0–99)
Triglycerides: 63 mg/dL (ref 0–149)
VLDL Cholesterol Cal: 13 mg/dL (ref 5–40)

## 2013-05-12 LAB — CMP14+EGFR
ALT: 13 IU/L (ref 0–44)
AST: 18 IU/L (ref 0–40)
Albumin/Globulin Ratio: 1.8 (ref 1.1–2.5)
Albumin: 4.4 g/dL (ref 3.6–4.8)
Alkaline Phosphatase: 56 IU/L (ref 39–117)
BUN/Creatinine Ratio: 13 (ref 10–22)
BUN: 15 mg/dL (ref 8–27)
CO2: 26 mmol/L (ref 18–29)
Calcium: 9.3 mg/dL (ref 8.6–10.2)
Chloride: 99 mmol/L (ref 97–108)
Creatinine, Ser: 1.18 mg/dL (ref 0.76–1.27)
GFR calc Af Amer: 76 mL/min/{1.73_m2} (ref >59)
GFR calc non Af Amer: 66 mL/min/{1.73_m2} (ref >59)
Globulin, Total: 2.4 g/dL (ref 1.5–4.5)
Glucose: 162 mg/dL — ABNORMAL HIGH (ref 65–99)
Potassium: 4.7 mmol/L (ref 3.5–5.2)
Sodium: 140 mmol/L (ref 134–144)
Total Bilirubin: 0.5 mg/dL (ref 0.0–1.2)
Total Protein: 6.8 g/dL (ref 6.0–8.5)

## 2013-05-14 ENCOUNTER — Telehealth: Payer: Self-pay | Admitting: Family Medicine

## 2013-05-14 NOTE — Telephone Encounter (Signed)
Message copied by Waverly Ferrari on Mon May 14, 2013  9:16 AM ------      Message from: Vernie Shanks      Created: Sun May 13, 2013  9:04 PM       Labs which are not at goal:      The HGBA1C went up compared to the last result.            The rest are at goal.            Recommend the following:       Get on a plant based  Diet. If at the next visit, the result does not improve, he may need to increase the dose of his DM medications.                          ------

## 2013-05-14 NOTE — Telephone Encounter (Signed)
Patient aware.

## 2013-05-31 ENCOUNTER — Other Ambulatory Visit: Payer: Self-pay | Admitting: *Deleted

## 2013-05-31 MED ORDER — GLUCOSE BLOOD VI STRP: ORAL_STRIP | Status: DC

## 2013-09-12 LAB — HM DIABETES EYE EXAM

## 2013-10-03 ENCOUNTER — Ambulatory Visit: Payer: Managed Care, Other (non HMO) | Admitting: Family Medicine

## 2013-10-11 ENCOUNTER — Other Ambulatory Visit: Payer: Self-pay | Admitting: Family Medicine

## 2013-10-12 ENCOUNTER — Encounter: Payer: Self-pay | Admitting: Family Medicine

## 2013-10-12 ENCOUNTER — Ambulatory Visit (INDEPENDENT_AMBULATORY_CARE_PROVIDER_SITE_OTHER): Payer: Managed Care, Other (non HMO) | Admitting: Family Medicine

## 2013-10-12 VITALS — BP 113/73 | HR 68 | Temp 98.4°F | Ht 72.0 in | Wt 189.0 lb

## 2013-10-12 DIAGNOSIS — Z23 Encounter for immunization: Secondary | ICD-10-CM

## 2013-10-12 DIAGNOSIS — I10 Essential (primary) hypertension: Secondary | ICD-10-CM

## 2013-10-12 DIAGNOSIS — E119 Type 2 diabetes mellitus without complications: Secondary | ICD-10-CM

## 2013-10-12 DIAGNOSIS — E785 Hyperlipidemia, unspecified: Secondary | ICD-10-CM

## 2013-10-12 LAB — POCT GLYCOSYLATED HEMOGLOBIN (HGB A1C): HEMOGLOBIN A1C: 6.9

## 2013-10-12 LAB — POCT UA - MICROALBUMIN: Microalbumin Ur, POC: 20 mg/L

## 2013-10-12 MED ORDER — AMLODIPINE BESY-BENAZEPRIL HCL 5-10 MG PO CAPS: ORAL_CAPSULE | ORAL | Status: DC

## 2013-10-12 MED ORDER — PIOGLITAZONE HCL 15 MG PO TABS: 15.0000 mg | ORAL_TABLET | Freq: Every day | ORAL | Status: DC

## 2013-10-12 MED ORDER — SAXAGLIPTIN-METFORMIN ER 2.5-1000 MG PO TB24: 2.0000 | ORAL_TABLET | Freq: Every day | ORAL | Status: DC

## 2013-10-12 NOTE — Addendum Note (Signed)
Addended by: Selmer Dominion on: 10/12/2013 09:15 AM   Modules accepted: Orders

## 2013-10-12 NOTE — Patient Instructions (Signed)
Consider pneumonia vaccine  Pneumococcal Vaccine, Polyvalent suspension for injection What is this medicine? PNEUMOCOCCAL VACCINE, POLYVALENT (NEU mo KOK al vak SEEN, pol ee VEY luhnt) is a vaccine to prevent pneumococcus bacteria infection. These bacteria are a major cause of ear infections, 'Strep throat' infections, and serious pneumonia, meningitis, or blood infections worldwide. These vaccines help the body to produce antibodies (protective substances) that help your body defend against these bacteria. This vaccine is recommended for infants and young children. This vaccine will not treat an infection. This medicine may be used for other purposes; ask your health care provider or pharmacist if you have questions. COMMON BRAND NAME(S): Prevnar 13 What should I tell my health care provider before I take this medicine? They need to know if you have any of these conditions: -bleeding problems -fever -immune system problems -low platelet count in the blood -seizures -an unusual or allergic reaction to pneumococcal vaccine, diphtheria toxoid, other vaccines, latex, other medicines, foods, dyes, or preservatives -pregnant or trying to get pregnant -breast-feeding How should I use this medicine? This vaccine is for injection into a muscle. It is given by a health care professional. A copy of Vaccine Information Statements will be given before each vaccination. Read this sheet carefully each time. The sheet may change frequently. Talk to your pediatrician regarding the use of this medicine in children. While this drug may be prescribed for children as young as 4 weeks old for selected conditions, precautions do apply. Overdosage: If you think you have taken too much of this medicine contact a poison control center or emergency room at once. NOTE: This medicine is only for you. Do not share this medicine with others. What if I miss a dose? It is important not to miss your dose. Call your doctor or  health care professional if you are unable to keep an appointment. What may interact with this medicine? -medicines for cancer chemotherapy -medicines that suppress your immune function -medicines that treat or prevent blood clots like warfarin, enoxaparin, and dalteparin -steroid medicines like prednisone or cortisone This list may not describe all possible interactions. Give your health care provider a list of all the medicines, herbs, non-prescription drugs, or dietary supplements you use. Also tell them if you smoke, drink alcohol, or use illegal drugs. Some items may interact with your medicine. What should I watch for while using this medicine? Mild fever and pain should go away in 3 days or less. Report any unusual symptoms to your doctor or health care professional. What side effects may I notice from receiving this medicine? Side effects that you should report to your doctor or health care professional as soon as possible: -allergic reactions like skin rash, itching or hives, swelling of the face, lips, or tongue -breathing problems -confused -fever over 102 degrees F -pain, tingling, numbness in the hands or feet -seizures -unusual bleeding or bruising -unusual muscle weakness Side effects that usually do not require medical attention (report to your doctor or health care professional if they continue or are bothersome): -aches and pains -diarrhea -fever of 102 degrees F or less -headache -irritable -loss of appetite -pain, tender at site where injected -trouble sleeping This list may not describe all possible side effects. Call your doctor for medical advice about side effects. You may report side effects to FDA at 1-800-FDA-1088. Where should I keep my medicine? This does not apply. This vaccine is given in a clinic, pharmacy, doctor's office, or other health care setting and will not be  stored at home. NOTE: This sheet is a summary. It may not cover all possible  information. If you have questions about this medicine, talk to your doctor, pharmacist, or health care provider.  2015, [employer]vier/Gold Standard. (2008-03-12 10:17:22)

## 2013-10-12 NOTE — Progress Notes (Signed)
   Subjective:    Patient ID: Tony Hodges, male    DOB: [birth year], 63 y.o.   MRN: [id number]  HPI  63 year old gentleman who presents today to followup hypertension, diabetes, and hyperlipidemia,. He has no complaints but has noted that his sugars when he checks them randomly are up a little bit.  Review of his labs from May of this year show lipids to be at goal. He continues with combination of saxagliptin and metformin as well as Actos. He had recent eye exam which by history was normal.    Review of Systems  Constitutional: Negative.   HENT: Negative.   Eyes: Negative.   Respiratory: Negative.  Negative for shortness of breath.   Cardiovascular: Negative.  Negative for chest pain and leg swelling.  Gastrointestinal: Negative.   Genitourinary: Negative.   Musculoskeletal: Negative.   Skin: Negative.   Neurological: Negative.   Psychiatric/Behavioral: Negative.   All other systems reviewed and are negative.      Objective:   Physical Exam  Constitutional: He is oriented to person, place, and time. He appears well-developed and well-nourished.  HENT:  Head: Normocephalic.  Right Ear: External ear normal.  Left Ear: External ear normal.  Nose: Nose normal.  Mouth/Throat: Oropharynx is clear and moist.  Eyes: Conjunctivae and EOM are normal. Pupils are equal, round, and reactive to light.  Neck: Normal range of motion. Neck supple.  Cardiovascular: Normal rate, regular rhythm, normal heart sounds and intact distal pulses.   Pulmonary/Chest: Effort normal and breath sounds normal.  Abdominal: Soft. Bowel sounds are normal.  Musculoskeletal: Normal range of motion.  Neurological: He is alert and oriented to person, place, and time.  Skin: Skin is warm and dry.  Psychiatric: He has a normal mood and affect. His behavior is normal. Judgment and thought content normal.          Assessment & Plan:  1. Essential hypertension BP controlled; no side effect  2. HLD  (hyperlipidemia) At goal  3. Type 2 diabetes mellitus without complication  - POCT glycosylated hemoglobin (Hb A1C) - POCT UA -   Wardell Honour MD

## 2013-10-13 LAB — MICROALBUMIN, URINE: Microalbumin, Urine: <3 ug/mL (ref 0.0–17.0)

## 2013-10-17 ENCOUNTER — Telehealth: Payer: Self-pay | Admitting: *Deleted

## 2013-10-17 NOTE — Telephone Encounter (Signed)
Aware. 

## 2013-10-31 ENCOUNTER — Other Ambulatory Visit: Payer: Self-pay | Admitting: Nurse Practitioner

## 2013-12-29 ENCOUNTER — Other Ambulatory Visit: Payer: Self-pay | Admitting: Family Medicine

## 2013-12-31 ENCOUNTER — Other Ambulatory Visit: Payer: Self-pay | Admitting: *Deleted

## 2013-12-31 MED ORDER — SAXAGLIPTIN-METFORMIN ER 2.5-1000 MG PO TB24: 2.0000 | ORAL_TABLET | Freq: Every day | ORAL | Status: DC

## 2014-01-02 ENCOUNTER — Other Ambulatory Visit: Payer: Self-pay | Admitting: Nurse Practitioner

## 2014-01-29 ENCOUNTER — Encounter: Payer: Self-pay | Admitting: *Deleted

## 2014-03-31 ENCOUNTER — Other Ambulatory Visit: Payer: Self-pay | Admitting: Family Medicine

## 2014-04-16 ENCOUNTER — Ambulatory Visit (INDEPENDENT_AMBULATORY_CARE_PROVIDER_SITE_OTHER): Payer: Managed Care, Other (non HMO) | Admitting: Family Medicine

## 2014-04-16 ENCOUNTER — Encounter: Payer: Self-pay | Admitting: Family Medicine

## 2014-04-16 VITALS — BP 127/78 | HR 71 | Temp 97.8°F | Ht 72.0 in | Wt 193.0 lb

## 2014-04-16 DIAGNOSIS — E785 Hyperlipidemia, unspecified: Secondary | ICD-10-CM

## 2014-04-16 DIAGNOSIS — I10 Essential (primary) hypertension: Secondary | ICD-10-CM | POA: Diagnosis not present

## 2014-04-16 DIAGNOSIS — E119 Type 2 diabetes mellitus without complications: Secondary | ICD-10-CM | POA: Diagnosis not present

## 2014-04-16 LAB — POCT GLYCOSYLATED HEMOGLOBIN (HGB A1C): HEMOGLOBIN A1C: 8.5

## 2014-04-16 MED ORDER — ATORVASTATIN CALCIUM 20 MG PO TABS: 20.0000 mg | ORAL_TABLET | Freq: Every day | ORAL | Status: DC

## 2014-04-16 MED ORDER — SAXAGLIPTIN-METFORMIN ER 2.5-1000 MG PO TB24: 2.0000 | ORAL_TABLET | Freq: Every day | ORAL | Status: DC

## 2014-04-16 MED ORDER — PIOGLITAZONE HCL 30 MG PO TABS: 30.0000 mg | ORAL_TABLET | Freq: Every day | ORAL | Status: DC

## 2014-04-16 MED ORDER — AMLODIPINE BESY-BENAZEPRIL HCL 5-10 MG PO CAPS: ORAL_CAPSULE | ORAL | Status: DC

## 2014-04-16 NOTE — Progress Notes (Signed)
   Subjective:    Patient ID: Tony Hodges, male    DOB: [birth year], 64 y.o.   MRN: [id number]  HPI  64 year old gentleman here to follow-up diabetes hypertension and hyperlipidemia. He denies any current problems. He does not check sugars very often at home but he did check it this morning and was elevated at 160. Weight is stable. He is compliant as far as watching carbohydrates. Patient Active Problem List   Diagnosis Date Noted  . Erectile dysfunction   . Diabetes mellitus   . HLD (hyperlipidemia) 05/25/2012  . HTN (hypertension) 05/25/2012   Outpatient Encounter Prescriptions as of 04/16/2014  Medication Sig  . amLODipine-benazepril (LOTREL) 5-10 MG per capsule TAKE ONE CAPSULE BY MOUTH EVERY DAY  . aspirin (LONGS ADULT LOW STRENGTH ASA) 81 MG EC tablet Take 81 mg by mouth daily.    Marland Kitchen atorvastatin (LIPITOR) 40 MG tablet TAKE 1 TABLET BY MOUTH AT BEDTIME (Patient taking differently: Take 1/2 tablet at bedtime)  . glucose blood (ACCU-CHEK AVIVA PLUS) test strip Check blood sugar twice daily and as needed. Dx Code 250.00  . KOMBIGLYZE XR 2.05-998 MG TB24 TAKE 2 TABLETS AT BEDTIME  . pioglitazone (ACTOS) 15 MG tablet Take 1 tablet (15 mg total) by mouth daily.       Review of Systems  Constitutional: Negative.   HENT: Negative.   Eyes: Negative.   Respiratory: Negative.  Negative for shortness of breath.   Cardiovascular: Negative.  Negative for chest pain and leg swelling.  Gastrointestinal: Negative.   Genitourinary: Negative.   Musculoskeletal: Negative.   Skin: Negative.   Neurological: Negative.   Psychiatric/Behavioral: Negative.   All other systems reviewed and are negative.      Objective:   Physical Exam  Constitutional: He is oriented to person, place, and time. He appears well-developed and well-nourished.  HENT:  Head: Normocephalic.  Right Ear: External ear normal.  Left Ear: External ear normal.  Nose: Nose normal.  Mouth/Throat: Oropharynx is clear and  moist.  Eyes: Conjunctivae and EOM are normal. Pupils are equal, round, and reactive to light.  Neck: Normal range of motion. Neck supple.  Cardiovascular: Normal rate, regular rhythm, normal heart sounds and intact distal pulses.   Pulmonary/Chest: Effort normal and breath sounds normal.  Abdominal: Soft. Bowel sounds are normal.  Musculoskeletal: Normal range of motion.  Neurological: He is alert and oriented to person, place, and time.  Skin: Skin is warm and dry.  Psychiatric: He has a normal mood and affect. His behavior is normal. Judgment and thought content normal.    BP 127/78 mmHg  Pulse 71  Temp(Src) 97.8 F (36.6 C) (Oral)  Ht 6' (1.829 m)  Wt 193 lb (87.544 kg)  BMI 26.17 kg/m2       Assessment & Plan:  1. Essential hypertension Blood pressures are controlled on current amlodipine benazepril combination.  2. HLD (hyperlipidemia) Patient takes 20 mg Lipitor. This well tolerated without side effects - CMP14+EGFR - Lipid panel  3. Type 2 diabetes mellitus without comp - POCT glycosylated hemoglobin (Hb A1C)  Wardell Honour MD

## 2014-04-17 ENCOUNTER — Telehealth: Payer: Self-pay | Admitting: *Deleted

## 2014-04-17 LAB — CMP14+EGFR
A/G RATIO: 1.6 (ref 1.1–2.5)
ALK PHOS: 64 IU/L (ref 39–117)
ALT: 17 IU/L (ref 0–44)
AST: 19 IU/L (ref 0–40)
Albumin: 4.2 g/dL (ref 3.6–4.8)
BUN / CREAT RATIO: 12 (ref 10–22)
BUN: 13 mg/dL (ref 8–27)
Bilirubin Total: 0.5 mg/dL (ref 0.0–1.2)
CO2: 27 mmol/L (ref 18–29)
Calcium: 9.3 mg/dL (ref 8.6–10.2)
Chloride: 98 mmol/L (ref 97–108)
Creatinine, Ser: 1.05 mg/dL (ref 0.76–1.27)
GFR calc Af Amer: 88 mL/min/{1.73_m2} (ref >59)
GFR calc non Af Amer: 76 mL/min/{1.73_m2} (ref >59)
GLOBULIN, TOTAL: 2.7 g/dL (ref 1.5–4.5)
Glucose: 197 mg/dL — ABNORMAL HIGH (ref 65–99)
Potassium: 4.7 mmol/L (ref 3.5–5.2)
SODIUM: 137 mmol/L (ref 134–144)
Total Protein: 6.9 g/dL (ref 6.0–8.5)

## 2014-04-17 LAB — LIPID PANEL
CHOLESTEROL TOTAL: 119 mg/dL (ref 100–199)
Chol/HDL Ratio: 2.8 ratio units (ref 0.0–5.0)
HDL: 43 mg/dL (ref >39)
LDL Calculated: 58 mg/dL (ref 0–99)
Triglycerides: 90 mg/dL (ref 0–149)
VLDL CHOLESTEROL CAL: 18 mg/dL (ref 5–40)

## 2014-04-17 NOTE — Telephone Encounter (Signed)
-----   Message from Wardell Honour, MD sent at 04/17/2014 12:43 PM EDT ----- We have ordered recommended to increase pioglitazone from 15-30 mg and repeat A1c in 3 months.  Lipid panel is unchanged pand there is no elevation in liver function

## 2014-04-17 NOTE — Telephone Encounter (Signed)
lmtcb regarding test results. 

## 2014-04-18 ENCOUNTER — Ambulatory Visit: Payer: Managed Care, Other (non HMO) | Admitting: Family Medicine

## 2014-04-18 NOTE — Telephone Encounter (Signed)
-----   Message from Wardell Honour, MD sent at 04/17/2014 12:43 PM EDT ----- We have ordered recommended to increase pioglitazone from 15-30 mg and repeat A1c in 3 months.  Lipid panel is unchanged pand there is no elevation in liver function

## 2014-04-18 NOTE — Telephone Encounter (Signed)
Pt notified of results Verbalizes understanding 

## 2014-05-01 ENCOUNTER — Other Ambulatory Visit: Payer: Self-pay

## 2014-05-01 MED ORDER — GLUCOSE BLOOD VI STRP
ORAL_STRIP | Status: DC
Start: 2014-05-01 — End: 2014-05-23

## 2014-05-18 ENCOUNTER — Other Ambulatory Visit: Payer: Self-pay | Admitting: Family Medicine

## 2014-05-23 ENCOUNTER — Other Ambulatory Visit: Payer: Self-pay | Admitting: Family Medicine

## 2014-05-23 MED ORDER — GLUCOSE BLOOD VI STRP: ORAL_STRIP | Status: DC

## 2014-05-23 MED ORDER — ONETOUCH ULTRASOFT LANCETS MISC: Status: DC

## 2014-05-23 NOTE — Telephone Encounter (Signed)
Done

## 2014-08-26 ENCOUNTER — Ambulatory Visit (INDEPENDENT_AMBULATORY_CARE_PROVIDER_SITE_OTHER): Payer: Managed Care, Other (non HMO) | Admitting: Family Medicine

## 2014-08-26 ENCOUNTER — Encounter: Payer: Self-pay | Admitting: Family Medicine

## 2014-08-26 VITALS — BP 127/77 | HR 69 | Temp 97.8°F | Ht 72.0 in | Wt 183.0 lb

## 2014-08-26 DIAGNOSIS — E785 Hyperlipidemia, unspecified: Secondary | ICD-10-CM | POA: Diagnosis not present

## 2014-08-26 DIAGNOSIS — I10 Essential (primary) hypertension: Secondary | ICD-10-CM | POA: Diagnosis not present

## 2014-08-26 DIAGNOSIS — E119 Type 2 diabetes mellitus without complications: Secondary | ICD-10-CM | POA: Diagnosis not present

## 2014-08-26 LAB — POCT GLYCOSYLATED HEMOGLOBIN (HGB A1C): HEMOGLOBIN A1C: 7

## 2014-08-26 NOTE — Progress Notes (Signed)
   Subjective:    Patient ID: Tony Hodges, male    DOB: 1950-06-29, 64 y.o.   MRN: [id number]  HPI 64 year old gentleman with diabetes, hyperlipidemia, hypertension, at his last visit, we increased Actos and when he's been checking sugars generally they've been less than 140. He checks only fasting sugars. Blood pressure has been well controlled on Lotrel. Lipids especially LDL, been in the 50s on only 10 mg of atorvastatin. He has had no side effects with Actos, such as edema.  Patient Active Problem List   Diagnosis Date Noted  . Erectile dysfunction   . Diabetes mellitus   . HLD (hyperlipidemia) 05/25/2012  . HTN (hypertension) 05/25/2012   Outpatient Encounter Prescriptions as of 08/26/2014  Medication Sig  . amLODipine-benazepril (LOTREL) 5-10 MG per capsule TAKE ONE CAPSULE BY MOUTH EVERY DAY  . aspirin (LONGS ADULT LOW STRENGTH ASA) 81 MG EC tablet Take 81 mg by mouth daily.    Marland Kitchen atorvastatin (LIPITOR) 20 MG tablet Take 1 tablet (20 mg total) by mouth at bedtime.  Marland Kitchen glucose blood test strip One touch ultra. Check blood sugar bid. Dx E11.9  . KOMBIGLYZE XR 2.05-998 MG TB24 TAKE 2 TABLETS AT BEDTIME  . Lancets (ONETOUCH ULTRASOFT) lancets Check blood sugar bid. Dx E11.9  . pioglitazone (ACTOS) 30 MG tablet Take 1 tablet (30 mg total) by mouth daily.   No facility-administered encounter medications on file as of 08/26/2014.      Review of Systems  Constitutional: Negative.   HENT: Negative.   Eyes: Negative.   Respiratory: Negative.  Negative for shortness of breath.   Cardiovascular: Negative.  Negative for chest pain and leg swelling.  Gastrointestinal: Negative.   Genitourinary: Negative.   Musculoskeletal: Negative.   Skin: Negative.   Neurological: Negative.   Psychiatric/Behavioral: Negative.   All other systems reviewed and are negative.      Objective:   Physical Exam  Constitutional: He is oriented to person, place, and time. He appears well-developed and  well-nourished.  Cardiovascular: Normal rate, regular rhythm and intact distal pulses.   Pulmonary/Chest: Effort normal and breath sounds normal.  Abdominal: Soft.  Neurological: He is alert and oriented to person, place, and time.      BP 127/77 mmHg  Pulse 69  Temp(Src) 97.8 F (36.6 C) (Oral)  Ht 6' (1.829 m)  Wt 183 lb (83.008 kg)  BMI 24.81 kg/m2     Assessment & Plan:  1. Type 2 diabetes mellitus without complication Sounds like current combination of metformin, saxagliptin, and pioglitazone is working well and is tolerated. I would expect A1c to have come back closer to target from 8.5 at his last visit. - POCT glycosylated hemoglobin (Hb A1C)  2. HLD (hyperlipidemia) Physical well controlled on low-dose atorvastatin  3. Essential hypertension Blood pressure is well controlled on current regimen. Would recommend no changes  Wardell Honour MD

## 2014-10-09 LAB — HM DIABETES EYE EXAM

## 2014-10-21 ENCOUNTER — Encounter: Payer: Self-pay | Admitting: *Deleted

## 2014-10-27 ENCOUNTER — Other Ambulatory Visit: Payer: Self-pay | Admitting: Family Medicine

## 2014-12-11 ENCOUNTER — Other Ambulatory Visit: Payer: Self-pay | Admitting: Family Medicine

## 2014-12-22 ENCOUNTER — Other Ambulatory Visit: Payer: Self-pay | Admitting: Family Medicine

## 2015-01-02 ENCOUNTER — Ambulatory Visit (INDEPENDENT_AMBULATORY_CARE_PROVIDER_SITE_OTHER): Payer: Managed Care, Other (non HMO) | Admitting: Family Medicine

## 2015-01-02 ENCOUNTER — Encounter: Payer: Self-pay | Admitting: Family Medicine

## 2015-01-02 VITALS — BP 96/62 | HR 73 | Temp 97.4°F | Ht 72.0 in | Wt 188.0 lb

## 2015-01-02 DIAGNOSIS — E0800 Diabetes mellitus due to underlying condition with hyperosmolarity without nonketotic hyperglycemic-hyperosmolar coma (NKHHC): Secondary | ICD-10-CM

## 2015-01-02 DIAGNOSIS — E785 Hyperlipidemia, unspecified: Secondary | ICD-10-CM | POA: Diagnosis not present

## 2015-01-02 DIAGNOSIS — Z23 Encounter for immunization: Secondary | ICD-10-CM

## 2015-01-02 DIAGNOSIS — I10 Essential (primary) hypertension: Secondary | ICD-10-CM

## 2015-01-02 LAB — POCT GLYCOSYLATED HEMOGLOBIN (HGB A1C): HEMOGLOBIN A1C: 7.1

## 2015-01-02 MED ORDER — ATORVASTATIN CALCIUM 20 MG PO TABS: 20.0000 mg | ORAL_TABLET | Freq: Every day | ORAL | Status: DC

## 2015-01-02 MED ORDER — AMLODIPINE BESY-BENAZEPRIL HCL 5-10 MG PO CAPS: 1.0000 | ORAL_CAPSULE | Freq: Every day | ORAL | Status: DC

## 2015-01-02 MED ORDER — SAXAGLIPTIN-METFORMIN ER 2.5-1000 MG PO TB24: 2.0000 | ORAL_TABLET | Freq: Every day | ORAL | Status: DC

## 2015-01-02 MED ORDER — PIOGLITAZONE HCL 30 MG PO TABS: ORAL_TABLET | ORAL | Status: DC

## 2015-01-02 NOTE — Progress Notes (Signed)
   Subjective:    Patient ID: Tony Hodges, male    DOB: [birth year], 64 y.o.   MRN: [id number]  HPI 64 year old gentleman is here to follow-up diabetes and blood pressure. Sugars at home, fasting, generally average 1:30. Last A1c was 7. He is on combination of 3 meds including Actos and Kombiglyze.   There no complaints referrable to his legs or feet.    Review of Systems  Constitutional: Negative.   Respiratory: Negative.   Cardiovascular: Negative.   Neurological: Negative.   Psychiatric/Behavioral: Negative.       BP 96/62 mmHg  Pulse 73  Temp(Src) 97.4 F (36.3 C) (Oral)  Ht 6' (1.829 m)  Wt 188 lb (85.276 kg)  BMI 25.49 kg/m2  Objective:   Physical Exam  Constitutional: He is oriented to person, place, and time. He appears well-developed and well-nourished.  Cardiovascular: Normal rate, regular rhythm and intact distal pulses.   Pulmonary/Chest: Effort normal and breath sounds normal.  Neurological: He is alert and oriented to person, place, and time.          Assessment & Plan:  1. Essential hypertension Are well controlled on Lotrel. Continue same  2. Diabetes mellitus due to underlying condition with hyperosmolarity without coma, without long-term current use of insulin (HCC) Diabetes seems to be well controlled will check A1c today. Would expect to continue same treatment - POCT glycosylated hemoglobin (Hb A1C) - Microalbumin / creatinine urine ratio  3. HLD (hyperlipidemia) Bizarre at goal we will need to check at next visit in 4 months  Wardell Honour MD

## 2015-01-03 LAB — MICROALBUMIN / CREATININE URINE RATIO
Creatinine, Urine: 193 mg/dL
MICROALB/CREAT RATIO: 25.4 mg/g creat (ref 0.0–30.0)
Microalbumin, Urine: 49 ug/mL

## 2015-03-31 ENCOUNTER — Telehealth: Payer: Self-pay | Admitting: Family Medicine

## 2015-04-02 ENCOUNTER — Ambulatory Visit (INDEPENDENT_AMBULATORY_CARE_PROVIDER_SITE_OTHER): Payer: Managed Care, Other (non HMO) | Admitting: Family Medicine

## 2015-04-02 ENCOUNTER — Encounter: Payer: Self-pay | Admitting: Family Medicine

## 2015-04-02 ENCOUNTER — Ambulatory Visit: Payer: Managed Care, Other (non HMO) | Admitting: Family Medicine

## 2015-04-02 ENCOUNTER — Ambulatory Visit (INDEPENDENT_AMBULATORY_CARE_PROVIDER_SITE_OTHER): Payer: Managed Care, Other (non HMO)

## 2015-04-02 VITALS — BP 158/83 | HR 70 | Temp 98.1°F | Ht 72.0 in | Wt 192.6 lb

## 2015-04-02 DIAGNOSIS — IMO0002 Reserved for concepts with insufficient information to code with codable children: Secondary | ICD-10-CM | POA: Insufficient documentation

## 2015-04-02 DIAGNOSIS — S93529A Sprain of metatarsophalangeal joint of unspecified toe(s), initial encounter: Secondary | ICD-10-CM

## 2015-04-02 DIAGNOSIS — S63650A Sprain of metacarpophalangeal joint of right index finger, initial encounter: Secondary | ICD-10-CM

## 2015-04-02 MED ORDER — DICLOFENAC SODIUM 1 % TD GEL: 2.0000 g | Freq: Four times a day (QID) | TRANSDERMAL | Status: DC

## 2015-04-02 NOTE — Addendum Note (Signed)
Addended by: Ilean China on: 04/02/2015 04:18 PM   Modules accepted: Orders

## 2015-04-02 NOTE — Patient Instructions (Signed)
Thank you for allowing us to care for you today. We strive to provide exceptional quality and compassionate care. Please let us know how we are doing and how we can help serve you better by filling out the survey that you receive from [employer].     

## 2015-04-02 NOTE — Progress Notes (Signed)
   Subjective:    Patient ID: Tony Hodges, male    DOB: 1950/08/19, 65 y.o.   MRN: [id number]  HPI patient was playing golf about 4 weeks ago and tried to hit out of the runoff and hyperextended his right index finger. Since then he has been having pain in the MP joint. Initially there was more swelling in. He is using BenGay cream over the area.  Patient Active Problem List   Diagnosis Date Noted  . Erectile dysfunction   . Diabetes mellitus ([place])   . HLD (hyperlipidemia) 05/25/2012  . HTN (hypertension) 05/25/2012   Outpatient Encounter Prescriptions as of 04/02/2015  Medication Sig  . amLODipine-benazepril (LOTREL) 5-10 MG capsule Take 1 capsule by mouth daily.  Marland Kitchen aspirin (LONGS ADULT LOW STRENGTH ASA) 81 MG EC tablet Take 81 mg by mouth daily.    Marland Kitchen atorvastatin (LIPITOR) 20 MG tablet Take 1 tablet (20 mg total) by mouth at bedtime.  Marland Kitchen glucose blood test strip One touch ultra. Check blood sugar bid. Dx E11.9  . Lancets (ONETOUCH ULTRASOFT) lancets Check blood sugar bid. Dx E11.9  . pioglitazone (ACTOS) 30 MG tablet TAKE 1 TABLET (30 MG TOTAL) BY MOUTH DAILY.  Marland Kitchen Saxagliptin-Metformin (KOMBIGLYZE XR) 2.05-998 MG TB24 Take 2 tablets by mouth at bedtime.   No facility-administered encounter medications on file as of 04/02/2015.      Review of Systems  Musculoskeletal: Positive for arthralgias.       Objective:   Physical Exam  Musculoskeletal:  Right index finger;. There is tenderness over the first MP joint. There is decreased strength with extension and he is unable to fully flex has in making a fist. Pincher strength is also reduced right compared to left          Assessment & Plan:  1. Sprain and strain of metatarsophalangeal joint, initial encounter I believe this injury does represent a sprain or strain of this extensor ligament. He would like to have it x-rayed to be sure there is no bony injury and but our x-ray machine is not working at the moment so I've asked  him to come back later today or tomorrow to have that x-ray done. I would recommend some exercises in which he extends the finger against some resistance from the opposite index finger also similar exercise resistance with flexion. Since the injury is 4 weeks out and still not healed but improved, I think the exercises would be appropriate. Will also try some diclofenac gel over the joint  Wardell Honour MD

## 2015-04-04 NOTE — Telephone Encounter (Signed)
Spoke to pt and advised that right hand xray negative. Pt voiced understanding.

## 2015-05-08 ENCOUNTER — Encounter: Payer: Self-pay | Admitting: Family Medicine

## 2015-05-08 ENCOUNTER — Ambulatory Visit (INDEPENDENT_AMBULATORY_CARE_PROVIDER_SITE_OTHER): Payer: Managed Care, Other (non HMO) | Admitting: Family Medicine

## 2015-05-08 VITALS — BP 121/75 | HR 64 | Temp 97.1°F | Ht 72.0 in | Wt 188.4 lb

## 2015-05-08 DIAGNOSIS — E0800 Diabetes mellitus due to underlying condition with hyperosmolarity without nonketotic hyperglycemic-hyperosmolar coma (NKHHC): Secondary | ICD-10-CM

## 2015-05-08 DIAGNOSIS — E785 Hyperlipidemia, unspecified: Secondary | ICD-10-CM

## 2015-05-08 DIAGNOSIS — I10 Essential (primary) hypertension: Secondary | ICD-10-CM

## 2015-05-08 LAB — CMP14+EGFR
ALT: 17 IU/L (ref 0–44)
AST: 16 IU/L (ref 0–40)
Albumin/Globulin Ratio: 1.8 (ref 1.2–2.2)
Albumin: 4.2 g/dL (ref 3.6–4.8)
Alkaline Phosphatase: 58 IU/L (ref 39–117)
BILIRUBIN TOTAL: 0.4 mg/dL (ref 0.0–1.2)
BUN/Creatinine Ratio: 14 (ref 10–24)
BUN: 17 mg/dL (ref 8–27)
CHLORIDE: 100 mmol/L (ref 96–106)
CO2: 24 mmol/L (ref 18–29)
Calcium: 9.4 mg/dL (ref 8.6–10.2)
Creatinine, Ser: 1.21 mg/dL (ref 0.76–1.27)
GFR calc non Af Amer: 63 mL/min/{1.73_m2} (ref >59)
GFR, EST AFRICAN AMERICAN: 73 mL/min/{1.73_m2} (ref >59)
GLUCOSE: 171 mg/dL — AB (ref 65–99)
Globulin, Total: 2.4 g/dL (ref 1.5–4.5)
POTASSIUM: 4.9 mmol/L (ref 3.5–5.2)
SODIUM: 139 mmol/L (ref 134–144)
TOTAL PROTEIN: 6.6 g/dL (ref 6.0–8.5)

## 2015-05-08 LAB — LIPID PANEL
Chol/HDL Ratio: 2.9 ratio units (ref 0.0–5.0)
Cholesterol, Total: 135 mg/dL (ref 100–199)
HDL: 47 mg/dL (ref >39)
LDL Calculated: 72 mg/dL (ref 0–99)
Triglycerides: 80 mg/dL (ref 0–149)
VLDL Cholesterol Cal: 16 mg/dL (ref 5–40)

## 2015-05-08 LAB — BAYER DCA HB A1C WAIVED: HB A1C: 7.9 % — AB (ref <7.0)

## 2015-05-08 NOTE — Progress Notes (Signed)
   Subjective:    Patient ID: Tony Hodges, male    DOB: 1950-10-26, 65 y.o.   MRN: [id number]  HPI 65 year old gentleman here to follow-up with his diabetes, lipids, and blood pressure. Blood pressure is well controlled on combination amlodipine and benazepril. Lipids were at goal when last checked 1 year ago with LDL of 58. Sugars have not been as good last A1c was 7.1 in December and today it is gone up to 7.9. His weight is down 5 pounds from last visit. Encourage more exercise and less carbs and diet.  Patient Active Problem List   Diagnosis Date Noted  . Sprain and strain of metatarsophalangeal joint 04/02/2015  . Erectile dysfunction   . Diabetes mellitus ([place])   . HLD (hyperlipidemia) 05/25/2012  . HTN (hypertension) 05/25/2012   Outpatient Encounter Prescriptions as of 05/08/2015  Medication Sig  . amLODipine-benazepril (LOTREL) 5-10 MG capsule Take 1 capsule by mouth daily.  Marland Kitchen aspirin (LONGS ADULT LOW STRENGTH ASA) 81 MG EC tablet Take 81 mg by mouth daily.    Marland Kitchen atorvastatin (LIPITOR) 20 MG tablet Take 1 tablet (20 mg total) by mouth at bedtime.  . diclofenac sodium (VOLTAREN) 1 % GEL Apply 2 g topically 4 (four) times daily.  Marland Kitchen glucose blood test strip One touch ultra. Check blood sugar bid. Dx E11.9  . Lancets (ONETOUCH ULTRASOFT) lancets Check blood sugar bid. Dx E11.9  . pioglitazone (ACTOS) 30 MG tablet TAKE 1 TABLET (30 MG TOTAL) BY MOUTH DAILY.  Marland Kitchen Saxagliptin-Metformin (KOMBIGLYZE XR) 2.05-998 MG TB24 Take 2 tablets by mouth at bedtime.   No facility-administered encounter medications on file as of 05/08/2015.      Review of Systems  Constitutional: Negative.   HENT: Negative.   Eyes: Negative.   Respiratory: Negative.  Negative for shortness of breath.   Cardiovascular: Negative.  Negative for chest pain and leg swelling.  Gastrointestinal: Negative.   Genitourinary: Negative.   Musculoskeletal: Negative.   Skin: Negative.   Neurological: Negative.     Psychiatric/Behavioral: Negative.   All other systems reviewed and are negative.      Objective:   Physical Exam  Constitutional: He is oriented to person, place, and time. He appears well-developed and well-nourished.  Cardiovascular: Normal rate, regular rhythm and intact distal pulses.   Pulmonary/Chest: Effort normal and breath sounds normal.  Neurological: He is oriented to person, place, and time.          Assessment & Plan:  1. Essential hypertension Pressure well controlled continue amlodipine benazepril - CMP14+EGFR  2. Diabetes mellitus due to underlying condition with hyperosmolarity without coma, without long-term current use of insulin ([place]) Diabetes is not as well controlled. Patient needs more attention to diet - Bayer DCA Hb A1c Waived  3. HLD (hyperlipidemia) Lipids were at goal one year ago we will update numbers today.  Wardell Honour MD - Lipid panel

## 2015-06-01 ENCOUNTER — Other Ambulatory Visit: Payer: Self-pay | Admitting: Family Medicine

## 2015-06-25 ENCOUNTER — Encounter: Payer: Self-pay | Admitting: Physician Assistant

## 2015-06-25 ENCOUNTER — Ambulatory Visit (INDEPENDENT_AMBULATORY_CARE_PROVIDER_SITE_OTHER): Payer: Managed Care, Other (non HMO) | Admitting: Physician Assistant

## 2015-06-25 VITALS — BP 160/89 | HR 64 | Temp 97.8°F | Ht 72.0 in | Wt 179.6 lb

## 2015-06-25 DIAGNOSIS — Z4802 Encounter for removal of sutures: Secondary | ICD-10-CM | POA: Diagnosis not present

## 2015-06-25 NOTE — Patient Instructions (Signed)
Facial Laceration ° A facial laceration is a cut on the face. These injuries can be painful and cause bleeding. Lacerations usually heal quickly, but they need special care to reduce scarring. °DIAGNOSIS  °Your health care provider will take a medical history, ask for details about how the injury occurred, and examine the wound to determine how deep the cut is. °TREATMENT  °Some facial lacerations may not require closure. Others may not be able to be closed because of an increased risk of infection. The risk of infection and the chance for successful closure will depend on various factors, including the amount of time since the injury occurred. °The wound may be cleaned to help prevent infection. If closure is appropriate, pain medicines may be given if needed. Your health care provider will use stitches (sutures), wound glue (adhesive), or skin adhesive strips to repair the laceration. These tools bring the skin edges together to allow for faster healing and a better cosmetic outcome. If needed, you may also be given a tetanus shot. °HOME CARE INSTRUCTIONS °· Only take over-the-counter or prescription medicines as directed by your health care provider. °· Follow your health care provider's instructions for wound care. These instructions will vary depending on the technique used for closing the wound. °For Sutures: °· Keep the wound clean and dry.   °· If you were given a bandage (dressing), you should change it at least once a day. Also change the dressing if it becomes wet or dirty, or as directed by your health care provider.   °· Wash the wound with soap and water 2 times a day. Rinse the wound off with water to remove all soap. Pat the wound dry with a clean towel.   °· After cleaning, apply a thin layer of the antibiotic ointment recommended by your health care provider. This will help prevent infection and keep the dressing from sticking.   °· You may shower as usual after the first 24 hours. Do not soak the  wound in water until the sutures are removed.   °· Get your sutures removed as directed by your health care provider. With facial lacerations, sutures should usually be taken out after 4-5 days to avoid stitch marks.   °· Wait a few days after your sutures are removed before applying any makeup. °For Skin Adhesive Strips: °· Keep the wound clean and dry.   °· Do not get the skin adhesive strips wet. You may bathe carefully, using caution to keep the wound dry.   °· If the wound gets wet, pat it dry with a clean towel.   °· Skin adhesive strips will fall off on their own. You may trim the strips as the wound heals. Do not remove skin adhesive strips that are still stuck to the wound. They will fall off in time.   °For Wound Adhesive: °· You may briefly wet your wound in the shower or bath. Do not soak or scrub the wound. Do not swim. Avoid periods of heavy sweating until the skin adhesive has fallen off on its own. After showering or bathing, gently pat the wound dry with a clean towel.   °· Do not apply liquid medicine, cream medicine, ointment medicine, or makeup to your wound while the skin adhesive is in place. This may loosen the film before your wound is healed.   °· If a dressing is placed over the wound, be careful not to apply tape directly over the skin adhesive. This may cause the adhesive to be pulled off before the wound is healed.   °· Avoid   prolonged exposure to sunlight or tanning lamps while the skin adhesive is in place. °· The skin adhesive will usually remain in place for 5-10 days, then naturally fall off the skin. Do not pick at the adhesive film.   °After Healing: °Once the wound has healed, cover the wound with sunscreen during the day for 1 full year. This can help minimize scarring. Exposure to ultraviolet light in the first year will darken the scar. It can take 1-2 years for the scar to lose its redness and to heal completely.  °SEEK MEDICAL CARE IF: °· You have a fever. °SEEK IMMEDIATE  MEDICAL CARE IF: °· You have redness, pain, or swelling around the wound.   °· You see a yellowish-white fluid (pus) coming from the wound.   °  °This information is not intended to replace advice given to you by your health care provider. Make sure you discuss any questions you have with your health care provider. °  °Document Released: 02/05/2004 Document Revised: 01/18/2014 Document Reviewed: 08/10/2012 °Elsevier Interactive Patient Education ©2016 [employer]. ° °

## 2015-06-25 NOTE — Progress Notes (Signed)
Subjective:     Patient ID: Tony Hodges, male   DOB: 11/29/1950, 65 y.o.   MRN: [id number]  HPI Pt here for suture removal Seen at [facility] and had 3 sites removed #1 to R temple, #2 to the R side of the nose,#3 to the lateral R knee area The nasal area had 2 sutures placed and it has been 1 week  Review of Systems Denies redness, swelling, pain, or drainage from any of the sites    Objective:   Physical Exam  #1- wound healed, no erythema,edema, or induration noted #2- 2 sutures in place, wound healed, no surrounding erythema,edema, or induration Sutures removed #3-shave lesion healing well, good granulation tissue noted No surrounding induration or edema    Assessment:     1. Visit for suture removal        Plan:     Wound care reviewed S/S of infection reviewed F/U prn

## 2015-08-14 ENCOUNTER — Ambulatory Visit (INDEPENDENT_AMBULATORY_CARE_PROVIDER_SITE_OTHER): Payer: Managed Care, Other (non HMO) | Admitting: Family Medicine

## 2015-08-14 ENCOUNTER — Encounter: Payer: Self-pay | Admitting: Family Medicine

## 2015-08-14 VITALS — BP 105/70 | HR 69 | Temp 97.6°F | Ht 72.0 in | Wt 186.0 lb

## 2015-08-14 DIAGNOSIS — E785 Hyperlipidemia, unspecified: Secondary | ICD-10-CM | POA: Diagnosis not present

## 2015-08-14 DIAGNOSIS — E0800 Diabetes mellitus due to underlying condition with hyperosmolarity without nonketotic hyperglycemic-hyperosmolar coma (NKHHC): Secondary | ICD-10-CM

## 2015-08-14 DIAGNOSIS — I10 Essential (primary) hypertension: Secondary | ICD-10-CM | POA: Diagnosis not present

## 2015-08-14 LAB — BAYER DCA HB A1C WAIVED: HB A1C (BAYER DCA - WAIVED): 7.2 % — ABNORMAL HIGH (ref <7.0)

## 2015-08-14 NOTE — Patient Instructions (Signed)
Medicare Annual Wellness Visit  [facility] and the medical providers at [facility] strive to bring you the best medical care.  In doing so we not only want to address your current medical conditions and concerns but also to detect new conditions early and prevent illness, disease and health-related problems.    Medicare offers a yearly Wellness Visit which allows our clinical staff to assess your need for preventative services including immunizations, lifestyle education, counseling to decrease risk of preventable diseases and screening for fall risk and other medical concerns.    This visit is provided free of charge (no copay) for all Medicare recipients. The clinical pharmacists at [facility] have begun to conduct these Wellness Visits which will also include a thorough review of all your medications.    As you primary medical provider recommend that you make an appointment for your Annual Wellness Visit if you have not done so already this year.  You may set up this appointment before you leave today or you may call back ([phone]) and schedule an appointment.  Please make sure when you call that you mention that you are scheduling your Annual Wellness Visit with the clinical pharmacist so that the appointment may be made for the proper length of time.     Continue current medications. Continue good therapeutic lifestyle changes which include good diet and exercise. Fall precautions discussed with patient. If an FOBT was given today- please return it to our front desk. If you are over 50 years old - you may need Prevnar 13 or the adult Pneumonia vaccine.  **Flu shots are available--- please call and schedule a FLU-CLINIC appointment**  After your visit with us today you will receive a survey in the mail or online from [employer] regarding your care with us. Please take a moment to fill this out. Your feedback is very  important to us as you can help us better understand your patient needs as well as improve your experience and satisfaction. WE CARE ABOUT YOU!!!    

## 2015-08-14 NOTE — Progress Notes (Signed)
   Subjective:    Patient ID: Tony Hodges, male    DOB: [birth year], 65 y.o.   MRN: [id number]  HPI Pt here for follow up and management of chronic medical problems which includes hypertension and diabetes. He is taking medications regularly. Seems to be doing well. Still working. Checks his sugars sporadically and generally less than 150. Tolerating medicines well including atorvastatin. He has gained 6 pounds in the last 2 months. Cautioned about weight gain. Sees eye doctor regularly for eye exams    Patient Active Problem List   Diagnosis Date Noted  . Sprain and strain of metatarsophalangeal joint 04/02/2015  . Erectile dysfunction   . Diabetes mellitus ([place])   . HLD (hyperlipidemia) 05/25/2012  . HTN (hypertension) 05/25/2012   Outpatient Encounter Prescriptions as of 08/14/2015  Medication Sig  . amLODipine-benazepril (LOTREL) 5-10 MG capsule Take 1 capsule by mouth daily.  Marland Kitchen aspirin (LONGS ADULT LOW STRENGTH ASA) 81 MG EC tablet Take 81 mg by mouth daily.    Marland Kitchen atorvastatin (LIPITOR) 20 MG tablet Take 1 tablet (20 mg total) by mouth at bedtime.  . Lancets (ONETOUCH ULTRASOFT) lancets Check blood sugar bid. Dx E11.9  . ONE TOUCH ULTRA TEST test strip CHECK TWICE A DAY  . pioglitazone (ACTOS) 30 MG tablet TAKE 1 TABLET (30 MG TOTAL) BY MOUTH DAILY.  Marland Kitchen Saxagliptin-Metformin (KOMBIGLYZE XR) 2.05-998 MG TB24 Take 2 tablets by mouth at bedtime.  . diclofenac sodium (VOLTAREN) 1 % GEL Apply 2 g topically 4 (four) times daily. (Patient not taking: Reported on 08/14/2015)   No facility-administered encounter medications on file as of 08/14/2015.       Review of Systems  Constitutional: Negative.   HENT: Negative.   Eyes: Negative.   Respiratory: Negative.   Cardiovascular: Negative.   Gastrointestinal: Negative.   Endocrine: Negative.   Genitourinary: Negative.   Musculoskeletal: Negative.   Skin: Negative.   Allergic/Immunologic: Negative.   Neurological: Negative.     Hematological: Negative.   Psychiatric/Behavioral: Negative.        Objective:   Physical Exam  Constitutional: He is oriented to person, place, and time. He appears well-developed and well-nourished.  Cardiovascular: Normal rate and regular rhythm.   Pulmonary/Chest: Effort normal and breath sounds normal.  Neurological: He is alert and oriented to person, place, and time.   BP 105/70 (BP Location: Left Arm)   Pulse 69   Temp 97.6 F (36.4 C) (Oral)   Ht 6' (1.829 m)   Wt 186 lb (84.4 kg)   BMI 25.23 kg/m         Assessment & Plan:  1. Essential hypertension Blood pressures have been well controlled, in fact a little bit low today. He does check it blood pressure at home and it has been in the accepted range  2. Diabetes mellitus due to underlying condition with hyperosmolarity without coma, without long-term current use of insulin (HCC) Last A1c was a little on the high side. At that visit and encouraged more attention to diet. Hopefully will be a little lower today - Bayer DCA Hb A1c Waived  3. HLD (hyperlipidemia) Lipids were at goal when checked 4 months ago. Continue atorvastatin  Wardell Honour MD

## 2015-08-17 ENCOUNTER — Other Ambulatory Visit: Payer: Self-pay | Admitting: Family Medicine

## 2015-09-23 ENCOUNTER — Other Ambulatory Visit: Payer: Self-pay | Admitting: Family Medicine

## 2015-10-19 ENCOUNTER — Other Ambulatory Visit: Payer: Self-pay | Admitting: Family Medicine

## 2015-11-25 ENCOUNTER — Ambulatory Visit (INDEPENDENT_AMBULATORY_CARE_PROVIDER_SITE_OTHER): Payer: Managed Care, Other (non HMO) | Admitting: Family Medicine

## 2015-11-25 ENCOUNTER — Encounter: Payer: Self-pay | Admitting: Family Medicine

## 2015-11-25 VITALS — BP 126/77 | HR 69 | Temp 98.0°F | Ht 72.0 in | Wt 188.0 lb

## 2015-11-25 DIAGNOSIS — I10 Essential (primary) hypertension: Secondary | ICD-10-CM | POA: Diagnosis not present

## 2015-11-25 DIAGNOSIS — N400 Enlarged prostate without lower urinary tract symptoms: Secondary | ICD-10-CM | POA: Diagnosis not present

## 2015-11-25 DIAGNOSIS — R972 Elevated prostate specific antigen [PSA]: Secondary | ICD-10-CM | POA: Diagnosis not present

## 2015-11-25 DIAGNOSIS — Z23 Encounter for immunization: Secondary | ICD-10-CM | POA: Diagnosis not present

## 2015-11-25 DIAGNOSIS — N401 Enlarged prostate with lower urinary tract symptoms: Secondary | ICD-10-CM | POA: Diagnosis not present

## 2015-11-25 DIAGNOSIS — R3912 Poor urinary stream: Secondary | ICD-10-CM

## 2015-11-25 DIAGNOSIS — E0800 Diabetes mellitus due to underlying condition with hyperosmolarity without nonketotic hyperglycemic-hyperosmolar coma (NKHHC): Secondary | ICD-10-CM

## 2015-11-25 LAB — BAYER DCA HB A1C WAIVED: HB A1C: 6.7 % (ref <7.0)

## 2015-11-25 NOTE — Progress Notes (Signed)
   Subjective:    Patient ID: Tony Hodges, male    DOB: [birth year], 65 y.o.   MRN: [id number]  HPI 65 year old gentleman who is followed for diabetes and hypertension and hyperlipidemia. He has been doing well and has no particular complaints today. He does have some left shoulder pain but he does not want to pursue any diagnostic tests or injections. Sugars have generally been less than 150, fasting. He denies any side effects related to statin therapy or ace therapy.  Patient Active Problem List   Diagnosis Date Noted  . Sprain and strain of metatarsophalangeal joint 04/02/2015  . Erectile dysfunction   . Diabetes mellitus ([place])   . HLD (hyperlipidemia) 05/25/2012  . HTN (hypertension) 05/25/2012   Outpatient Encounter Prescriptions as of 11/25/2015  Medication Sig  . amLODipine-benazepril (LOTREL) 5-10 MG capsule TAKE 1 CAPSULE BY MOUTH DAILY.  Marland Kitchen aspirin (LONGS ADULT LOW STRENGTH ASA) 81 MG EC tablet Take 81 mg by mouth daily.    Marland Kitchen atorvastatin (LIPITOR) 20 MG tablet Take 1 tablet (20 mg total) by mouth at bedtime.  . diclofenac sodium (VOLTAREN) 1 % GEL Apply 2 g topically 4 (four) times daily.  Marland Kitchen KOMBIGLYZE XR 2.05-998 MG TB24 TAKE 2 TABLETS BY MOUTH AT BEDTIME.  . Lancets (ONETOUCH ULTRASOFT) lancets Check blood sugar bid. Dx E11.9  . ONE TOUCH ULTRA TEST test strip CHECK TWICE A DAY  . pioglitazone (ACTOS) 30 MG tablet TAKE 1 TABLET (30 MG TOTAL) BY MOUTH DAILY.   No facility-administered encounter medications on file as of 11/25/2015.       Review of Systems  Constitutional: Negative.   HENT: Negative.   Eyes: Negative.   Respiratory: Negative.  Negative for shortness of breath.   Cardiovascular: Negative.  Negative for chest pain and leg swelling.  Gastrointestinal: Negative.   Genitourinary: Negative.   Musculoskeletal: Positive for arthralgias.  Skin: Negative.   Neurological: Negative.   Psychiatric/Behavioral: Negative.   All other systems reviewed and are  negative.      Objective:   Physical Exam  Constitutional: He is oriented to person, place, and time. He appears well-developed and well-nourished.  Cardiovascular: Normal rate, regular rhythm, normal heart sounds and intact distal pulses.   Pulmonary/Chest: Effort normal and breath sounds normal.  Neurological: He is alert and oriented to person, place, and time.  Psychiatric: He has a normal mood and affect. His behavior is normal.   BP 126/77   Pulse 69   Temp 98 F (36.7 C) (Oral)   Ht 6' (1.829 m)   Wt 188 lb (85.3 kg)   BMI 25.50 kg/m         Assessment & Plan:  1. Essential hypertension Blood pressure is well controlled on current regimen of amlodipine benazepril.  2. Diabetes mellitus due to underlying condition with hyperosmolarity without coma, without long-term current use of insulin (HCC) Expect A1c to continue around 7-7.2 - Bayer DCA Hb A1c Waived  3. BPH with elevated PSA Patient has had previous prostate biopsies. He now has nocturia and slow urination - PSA, total and free  4. Encounter for immunization - Flu Vaccine QUAD 36+ mos   Wardell Honour MD

## 2015-11-26 LAB — PSA, TOTAL AND FREE
PROSTATE SPECIFIC AG, SERUM: 1 ng/mL (ref 0.0–4.0)
PSA FREE: 0.33 ng/mL
PSA, Free Pct: 33 %

## 2015-12-03 LAB — HM DIABETES EYE EXAM

## 2016-01-01 ENCOUNTER — Ambulatory Visit (INDEPENDENT_AMBULATORY_CARE_PROVIDER_SITE_OTHER): Payer: Managed Care, Other (non HMO) | Admitting: Family Medicine

## 2016-01-01 ENCOUNTER — Encounter: Payer: Self-pay | Admitting: Family Medicine

## 2016-01-01 VITALS — BP 113/71 | HR 65 | Temp 98.0°F | Ht 72.0 in | Wt 193.0 lb

## 2016-01-01 DIAGNOSIS — M25512 Pain in left shoulder: Secondary | ICD-10-CM | POA: Diagnosis not present

## 2016-01-01 DIAGNOSIS — G8929 Other chronic pain: Secondary | ICD-10-CM | POA: Diagnosis not present

## 2016-01-01 NOTE — Progress Notes (Addendum)
   Subjective:    Patient ID: Tony Hodges, male    DOB: 05/07/1950, 65 y.o.   MRN: [id number]  HPI Patient here today for left shoulder pain. This has been chronic. Patient had MRI 5 or 6 years ago and symptoms resolved but have now come back. He localizes the greatest discomfort over the lateral aspect of his left shoulder.   Patient Active Problem List   Diagnosis Date Noted  . BPH (benign prostatic hyperplasia) 11/25/2015  . Sprain and strain of metatarsophalangeal joint 04/02/2015  . Erectile dysfunction   . Diabetes mellitus ([place])   . HLD (hyperlipidemia) 05/25/2012  . HTN (hypertension) 05/25/2012   Outpatient Encounter Prescriptions as of 01/01/2016  Medication Sig  . amLODipine-benazepril (LOTREL) 5-10 MG capsule TAKE 1 CAPSULE BY MOUTH DAILY.  Marland Kitchen aspirin (LONGS ADULT LOW STRENGTH ASA) 81 MG EC tablet Take 81 mg by mouth daily.    Marland Kitchen atorvastatin (LIPITOR) 20 MG tablet Take 1 tablet (20 mg total) by mouth at bedtime.  . diclofenac sodium (VOLTAREN) 1 % GEL Apply 2 g topically 4 (four) times daily.  Marland Kitchen KOMBIGLYZE XR 2.05-998 MG TB24 TAKE 2 TABLETS BY MOUTH AT BEDTIME.  . Lancets (ONETOUCH ULTRASOFT) lancets Check blood sugar bid. Dx E11.9  . ONE TOUCH ULTRA TEST test strip CHECK TWICE A DAY  . pioglitazone (ACTOS) 30 MG tablet TAKE 1 TABLET (30 MG TOTAL) BY MOUTH DAILY.   No facility-administered encounter medications on file as of 01/01/2016.       Review of Systems  Constitutional: Negative.   HENT: Negative.   Eyes: Negative.   Respiratory: Negative.   Cardiovascular: Negative.   Gastrointestinal: Negative.   Endocrine: Negative.   Genitourinary: Negative.   Musculoskeletal: Positive for arthralgias (left shoulder pain).  Skin: Negative.   Allergic/Immunologic: Negative.   Neurological: Negative.   Hematological: Negative.   Psychiatric/Behavioral: Negative.        Objective:   Physical Exam  Constitutional: He appears well-developed and well-nourished.    Musculoskeletal:  Left shoulder: Normal range of motion. There is some discomfort with with internal rotation. Palpation reveals greatest tenderness over the deltoid tendon and just under the acromion on the lateral aspect This area was injected with Depo-Medrol and Marcaine. I've asked him to call back in 2 weeks if symptoms aren't improved. At that time would consider MRI to rule out rotator cuff.   BP 113/71 (BP Location: Right Arm)   Pulse 65   Temp 98 F (36.7 C) (Oral)   Ht 6' (1.829 m)   Wt 193 lb (87.5 kg)   BMI 26.18 kg/m         Assessment & Plan:  1. Chronic left shoulder pain Probably related to tendinitis or bursitis  2. Acute pain of left shoulder Area of maximal tenderness was injected with steroid and lidocaine preparation

## 2016-01-11 ENCOUNTER — Other Ambulatory Visit: Payer: Self-pay | Admitting: Family Medicine

## 2016-01-16 ENCOUNTER — Other Ambulatory Visit: Payer: Self-pay | Admitting: Family Medicine

## 2016-02-10 ENCOUNTER — Other Ambulatory Visit: Payer: Self-pay | Admitting: Family Medicine

## 2016-02-15 ENCOUNTER — Other Ambulatory Visit: Payer: Self-pay | Admitting: Family Medicine

## 2016-02-27 ENCOUNTER — Ambulatory Visit: Payer: Managed Care, Other (non HMO) | Admitting: Family Medicine

## 2016-03-03 ENCOUNTER — Encounter: Payer: Self-pay | Admitting: Family Medicine

## 2016-03-03 ENCOUNTER — Ambulatory Visit (INDEPENDENT_AMBULATORY_CARE_PROVIDER_SITE_OTHER): Payer: Commercial Managed Care - HMO | Admitting: Family Medicine

## 2016-03-03 VITALS — BP 121/66 | HR 74 | Temp 98.4°F | Ht 72.0 in | Wt 191.0 lb

## 2016-03-03 DIAGNOSIS — N400 Enlarged prostate without lower urinary tract symptoms: Secondary | ICD-10-CM

## 2016-03-03 DIAGNOSIS — I10 Essential (primary) hypertension: Secondary | ICD-10-CM

## 2016-03-03 DIAGNOSIS — E7800 Pure hypercholesterolemia, unspecified: Secondary | ICD-10-CM | POA: Diagnosis not present

## 2016-03-03 DIAGNOSIS — R972 Elevated prostate specific antigen [PSA]: Secondary | ICD-10-CM | POA: Diagnosis not present

## 2016-03-03 DIAGNOSIS — E118 Type 2 diabetes mellitus with unspecified complications: Secondary | ICD-10-CM | POA: Diagnosis not present

## 2016-03-03 DIAGNOSIS — E0800 Diabetes mellitus due to underlying condition with hyperosmolarity without nonketotic hyperglycemic-hyperosmolar coma (NKHHC): Secondary | ICD-10-CM | POA: Diagnosis not present

## 2016-03-03 DIAGNOSIS — Z0000 Encounter for general adult medical examination without abnormal findings: Secondary | ICD-10-CM

## 2016-03-03 DIAGNOSIS — M25512 Pain in left shoulder: Secondary | ICD-10-CM | POA: Diagnosis not present

## 2016-03-03 LAB — BAYER DCA HB A1C WAIVED: HB A1C (BAYER DCA - WAIVED): 7.8 % — ABNORMAL HIGH (ref <7.0)

## 2016-03-03 NOTE — Patient Instructions (Signed)
Continue current medications. Continue good therapeutic lifestyle changes which include good diet and exercise. Fall precautions discussed with patient. If an FOBT was given today- please return it to our front desk. If you are over 65 years old - you may need Prevnar 13 or the adult Pneumonia vaccine.  **Flu shots are available--- please call and schedule a FLU-CLINIC appointment**  After your visit with us today you will receive a survey in the mail or online from [employer] regarding your care with us. Please take a moment to fill this out. Your feedback is very important to us as you can help us better understand your patient needs as well as improve your experience and satisfaction. WE CARE ABOUT YOU!!!    

## 2016-03-03 NOTE — Progress Notes (Signed)
Subjective:    Patient ID: Tony Hodges, male    DOB: March 02, 1950, 66 y.o.   MRN: [id number]  HPI Patient is here today for annual wellness exam and follow up of chronic medical problems which includes diabetes, hyperlipidemia and hypertension. He is taking medication regularly. Still complains of pain in left shoulder. Received an injection at last visit 3 months ago which helped. Pain does not keep him awake at night and he is able to sleep on that shoulder. Blood sugars have been good and his last A1c back in November was 6.7.    Patient Active Problem List   Diagnosis Date Noted  . BPH (benign prostatic hyperplasia) 11/25/2015  . Sprain and strain of metatarsophalangeal joint 04/02/2015  . Erectile dysfunction   . Diabetes mellitus ([place])   . HLD (hyperlipidemia) 05/25/2012  . HTN (hypertension) 05/25/2012   Outpatient Encounter Prescriptions as of 03/03/2016  Medication Sig  . amLODipine-benazepril (LOTREL) 5-10 MG capsule TAKE 1 CAPSULE BY MOUTH DAILY.  Marland Kitchen aspirin (LONGS ADULT LOW STRENGTH ASA) 81 MG EC tablet Take 81 mg by mouth daily.    Marland Kitchen atorvastatin (LIPITOR) 20 MG tablet TAKE 1 TABLET (20 MG TOTAL) BY MOUTH AT BEDTIME.  Marland Kitchen KOMBIGLYZE XR 2.05-998 MG TB24 TAKE 2 TABLETS BY MOUTH AT BEDTIME.  . Lancets (ONETOUCH ULTRASOFT) lancets Check blood sugar bid. Dx E11.9  . ONE TOUCH ULTRA TEST test strip CHECK TWICE A DAY  . pioglitazone (ACTOS) 30 MG tablet TAKE 1 TABLET (30 MG TOTAL) BY MOUTH DAILY.  Marland Kitchen diclofenac sodium (VOLTAREN) 1 % GEL Apply 2 g topically 4 (four) times daily. (Patient not taking: Reported on 03/03/2016)   No facility-administered encounter medications on file as of 03/03/2016.       Review of Systems  Constitutional: Negative.   HENT: Negative.   Eyes: Negative.   Respiratory: Negative.   Cardiovascular: Negative.   Gastrointestinal: Negative.   Endocrine: Negative.   Genitourinary: Negative.   Musculoskeletal: Negative.   Skin: Negative.     Allergic/Immunologic: Negative.   Neurological: Negative.   Hematological: Negative.   Psychiatric/Behavioral: Negative.        Objective:   Physical Exam  Constitutional: He appears well-developed and well-nourished.  Cardiovascular: Normal rate and regular rhythm.   Pulmonary/Chest: Effort normal and breath sounds normal.  Musculoskeletal:  Left shoulder again injected with Depo-Medrol and Marcaine over the lateral aspect Point of maximum tenderness in the deltoid tendon area.   BP 121/66 (BP Location: Left Arm)   Pulse 74   Temp 98.4 F (36.9 C) (Oral)   Ht 6' (1.829 m)   Wt 191 lb (86.6 kg)   BMI 25.90 kg/m         Assessment & Plan:  1. Annual physical exam [facility] just did a brief exam today and when he returns in 2-3 months for next A1c he will take that opportunity to meet his new provider and do a more complete physical - Bayer DCA Hb A1c Waived - Microalbumin / creatinine urine ratio  2. Diabetes mellitus due to underlying condition with hyperosmolarity without coma, without long-term current use of insulin ([place]) Patient does carry well on current regimen of Kombiglyze XR X are and pioglitazone. - Bayer DCA Hb A1c Waived - Microalbumin / creatinine urine ratio  3. BPH with elevated PSA Problem has not worsened  4. Pure hypercholesterolemia Lipids were at goal when last assessed in April 2017. At his next visit would recheck lipids and liver  5.  Essential hypertension Blood pressure is well controlled at 121/66 on combination of calcium check blocker and ACE inhibitor  Wardell Honour MD

## 2016-03-04 LAB — MICROALBUMIN / CREATININE URINE RATIO: Creatinine, Urine: 147.1 mg/dL

## 2016-03-18 ENCOUNTER — Other Ambulatory Visit: Payer: Self-pay | Admitting: Family Medicine

## 2016-05-07 ENCOUNTER — Other Ambulatory Visit: Payer: Self-pay | Admitting: Family Medicine

## 2016-06-02 ENCOUNTER — Other Ambulatory Visit: Payer: Self-pay | Admitting: Family Medicine

## 2016-06-14 ENCOUNTER — Encounter: Payer: Self-pay | Admitting: Family Medicine

## 2016-06-14 ENCOUNTER — Ambulatory Visit (INDEPENDENT_AMBULATORY_CARE_PROVIDER_SITE_OTHER): Payer: Commercial Managed Care - HMO | Admitting: Family Medicine

## 2016-06-14 VITALS — BP 141/72 | HR 62 | Temp 98.7°F | Ht 73.0 in | Wt 187.0 lb

## 2016-06-14 DIAGNOSIS — Z23 Encounter for immunization: Secondary | ICD-10-CM | POA: Diagnosis not present

## 2016-06-14 DIAGNOSIS — E119 Type 2 diabetes mellitus without complications: Secondary | ICD-10-CM

## 2016-06-14 DIAGNOSIS — E7800 Pure hypercholesterolemia, unspecified: Secondary | ICD-10-CM | POA: Diagnosis not present

## 2016-06-14 DIAGNOSIS — I10 Essential (primary) hypertension: Secondary | ICD-10-CM | POA: Diagnosis not present

## 2016-06-14 DIAGNOSIS — E875 Hyperkalemia: Secondary | ICD-10-CM

## 2016-06-14 DIAGNOSIS — E0800 Diabetes mellitus due to underlying condition with hyperosmolarity without nonketotic hyperglycemic-hyperosmolar coma (NKHHC): Secondary | ICD-10-CM

## 2016-06-14 LAB — BAYER DCA HB A1C WAIVED: HB A1C (BAYER DCA - WAIVED): 7 % — ABNORMAL HIGH (ref <7.0)

## 2016-06-14 NOTE — Progress Notes (Signed)
   HPI  Patient presents today here to follow-up for chronic medical conditions.  Diabetes Average fasting blood sugar is 110-140 Good medication compliance no hypoglycemic. Watching diet closer  Hypertension Blood pressures at home are average 301P systolic. Good medication compliance, no chest pain, dyspnea, palpitations.  Hyperlipidemia Patient watching diet better than previously, good medication compliance.  PMH: Smoking status noted ROS: Per HPI  Objective: BP (!) 141/72   Pulse 62   Temp 98.7 F (37.1 C) (Oral)   Ht _0  (1.854 m)   Wt 187 lb (84.8 kg)   BMI 24.67 kg/m  Gen: NAD, alert, cooperative with exam HEENT: NCAT CV: RRR, good S1/S2, no murmur Resp: CTABL, no wheezes, non-labored Abd: SNTND, BS present, no guarding or organomegaly Ext: No edema, warm Neuro: Alert and oriented, No gross deficits  Assessment and plan:  # Type 2 diabetes Control improving, controlled, A1c 7.0. Continue current regimen-Actos plus, kombiglyze Labs  # Hyperlipidemia Good medication compliance, expected control Continue statin Labs  # Hypertension Well-controlled at home, slightly elevated today Continue current medications.   # Healthcare maintenance Discussed shingles vaccine and pneumonia vaccine Given Prevnar today, counseling provided for all vaccine components. Patient will check on insurance coverage and likely proceed with shingrix next visit    Orders Placed This Encounter  Procedures  . Bayer DCA Hb A1c Waived  . CMP14+EGFR  . Lipid panel  . CBC with [place], MD [facility] 06/14/2016, 10:30 AM

## 2016-06-14 NOTE — Patient Instructions (Signed)
Great to meet you!  You have made good progress with diabetes, keep up the hard work! Your A1C was 7.0 today.   Come back in 3 months unless you need [place] sooner.   Consider prevnar.

## 2016-06-14 NOTE — Addendum Note (Signed)
Addended by: Nigel Berthold C on: 06/14/2016 10:34 AM   Modules accepted: Orders

## 2016-06-15 LAB — LIPID PANEL
CHOL/HDL RATIO: 2.4 ratio (ref 0.0–5.0)
Cholesterol, Total: 120 mg/dL (ref 100–199)
HDL: 49 mg/dL (ref >39)
LDL CALC: 57 mg/dL (ref 0–99)
Triglycerides: 68 mg/dL (ref 0–149)
VLDL CHOLESTEROL CAL: 14 mg/dL (ref 5–40)

## 2016-06-15 LAB — CMP14+EGFR
A/G RATIO: 1.5 (ref 1.2–2.2)
ALT: 13 IU/L (ref 0–44)
AST: 16 IU/L (ref 0–40)
Albumin: 4.2 g/dL (ref 3.6–4.8)
Alkaline Phosphatase: 48 IU/L (ref 39–117)
BUN/Creatinine Ratio: 17 (ref 10–24)
BUN: 20 mg/dL (ref 8–27)
Bilirubin Total: 0.3 mg/dL (ref 0.0–1.2)
CALCIUM: 9.1 mg/dL (ref 8.6–10.2)
CO2: 25 mmol/L (ref 18–29)
CREATININE: 1.16 mg/dL (ref 0.76–1.27)
Chloride: 100 mmol/L (ref 96–106)
GFR, EST AFRICAN AMERICAN: 76 mL/min/{1.73_m2} (ref >59)
GFR, EST NON AFRICAN AMERICAN: 66 mL/min/{1.73_m2} (ref >59)
Globulin, Total: 2.8 g/dL (ref 1.5–4.5)
Glucose: 98 mg/dL (ref 65–99)
Potassium: 5.4 mmol/L — ABNORMAL HIGH (ref 3.5–5.2)
Sodium: 140 mmol/L (ref 134–144)
TOTAL PROTEIN: 7 g/dL (ref 6.0–8.5)

## 2016-06-15 LAB — CBC WITH DIFFERENTIAL/PLATELET
BASOS ABS: 0 10*3/uL (ref 0.0–0.2)
Basos: 1 %
EOS (ABSOLUTE): 0 10*3/uL (ref 0.0–0.4)
Eos: 1 %
Hematocrit: 45.1 % (ref 37.5–51.0)
Hemoglobin: 14.3 g/dL (ref 13.0–17.7)
Immature Grans (Abs): 0 10*3/uL (ref 0.0–0.1)
Immature Granulocytes: 0 %
LYMPHS ABS: 1.5 10*3/uL (ref 0.7–3.1)
Lymphs: 23 %
MCH: 28.9 pg (ref 26.6–33.0)
MCHC: 31.7 g/dL (ref 31.5–35.7)
MCV: 91 fL (ref 79–97)
MONOCYTES: 8 %
MONOS ABS: 0.5 10*3/uL (ref 0.1–0.9)
NEUTROS ABS: 4.4 10*3/uL (ref 1.4–7.0)
Neutrophils: 67 %
PLATELETS: 232 10*3/uL (ref 150–379)
RBC: 4.94 x10E6/uL (ref 4.14–5.80)
RDW: 14.6 % (ref 12.3–15.4)
WBC: 6.4 10*3/uL (ref 3.4–10.8)

## 2016-06-15 NOTE — Addendum Note (Signed)
Addended by: Nigel Berthold C on: 06/15/2016 11:27 AM   Modules accepted: Orders

## 2016-06-23 ENCOUNTER — Other Ambulatory Visit: Payer: Commercial Managed Care - HMO

## 2016-06-23 DIAGNOSIS — E875 Hyperkalemia: Secondary | ICD-10-CM

## 2016-06-23 LAB — BMP8+EGFR
BUN / CREAT RATIO: 15 (ref 10–24)
BUN: 18 mg/dL (ref 8–27)
CALCIUM: 9.6 mg/dL (ref 8.6–10.2)
CHLORIDE: 99 mmol/L (ref 96–106)
CO2: 26 mmol/L (ref 20–29)
Creatinine, Ser: 1.23 mg/dL (ref 0.76–1.27)
GFR calc Af Amer: 71 mL/min/{1.73_m2} (ref >59)
GFR calc non Af Amer: 61 mL/min/{1.73_m2} (ref >59)
Glucose: 125 mg/dL — ABNORMAL HIGH (ref 65–99)
POTASSIUM: 4.4 mmol/L (ref 3.5–5.2)
SODIUM: 140 mmol/L (ref 134–144)

## 2016-07-07 ENCOUNTER — Other Ambulatory Visit: Payer: Self-pay | Admitting: Family Medicine

## 2016-07-19 ENCOUNTER — Ambulatory Visit (INDEPENDENT_AMBULATORY_CARE_PROVIDER_SITE_OTHER): Payer: 59 | Admitting: Nurse Practitioner

## 2016-07-19 ENCOUNTER — Encounter: Payer: Self-pay | Admitting: Nurse Practitioner

## 2016-07-19 ENCOUNTER — Ambulatory Visit (INDEPENDENT_AMBULATORY_CARE_PROVIDER_SITE_OTHER): Payer: 59

## 2016-07-19 VITALS — BP 125/72 | HR 77 | Temp 98.3°F | Ht 73.0 in | Wt 187.0 lb

## 2016-07-19 DIAGNOSIS — M79671 Pain in right foot: Secondary | ICD-10-CM | POA: Diagnosis not present

## 2016-07-19 DIAGNOSIS — M25561 Pain in right knee: Secondary | ICD-10-CM | POA: Diagnosis not present

## 2016-07-19 DIAGNOSIS — S92414A Nondisplaced fracture of proximal phalanx of right great toe, initial encounter for closed fracture: Secondary | ICD-10-CM

## 2016-07-19 MED ORDER — PREDNISONE 10 MG (21) PO TBPK: ORAL_TABLET | ORAL | 0 refills | Status: DC

## 2016-07-19 NOTE — Progress Notes (Signed)
   Subjective:    Patient ID: Tony Hodges, male    DOB: 1950-09-19, 66 y.o.   MRN: [id number]  HPI Patient was out of town on [place], [place] and got up in the middle of night to go the bathroom and fell down 12 steps. He did not hit his head. He is c/o right great toe pain and right knee pain.  Toe is bruised and knee has swollen up. Toe is really not bothering him that much but knee is painful to walk on.   Review of Systems  Constitutional: Negative.   Respiratory: Negative.   Cardiovascular: Negative.   Musculoskeletal: Positive for arthralgias and joint swelling.  Neurological: Negative.   Psychiatric/Behavioral: Negative.   All other systems reviewed and are negative.      Objective:   Physical Exam  Constitutional: He appears well-developed and well-nourished. No distress.  Cardiovascular: Normal rate and regular rhythm.   Pulmonary/Chest: Effort normal and breath sounds normal.  Musculoskeletal:  Right knee effusion with decrease ROM. Unable  To full extend knee. All ligaments are intact.  Contusion of righ great toe with o pain on movement.   BP 125/72   Pulse 77   Temp 98.3 F (36.8 C) (Oral)   Ht 6\' 1"  (1.854 m)   Wt 187 lb (84.8 kg)   BMI 24.67 kg/m   Right 1st tie xray- nondisplaced fracture of proximal end of proximal phalanx right great toe-Preliminary reading by Ronnald Collum, FNP  [facility] Right knee x ray- effusions- no fractures visible-Preliminary reading by Ronnald Collum, FNP  [facility]        Assessment & Plan:   1. Acute pain of right knee   2. Right foot pain   3. Nondisplaced fracture of proximal phalanx of right great toe, initial encounter for closed fracture    Meds ordered this encounter  Medications  . predniSONE (STERAPRED UNI-PAK 21 TAB) 10 MG (21) TBPK tablet    Sig: As directed x 6 days    Dispense:  21 tablet    Refill:  0    Order Specific Question:   Supervising Provider    Answer:   Evette Doffing, CAROL L [[id number]]   Ice toe Ice knee  BID Limit weigh bearing over the next couple of days Elevate leg when sitting Wear compression brace when up walking around RTO in 1 week if not better and we will order MRI of knee  Mary-Margaret Hassell Done, FNP

## 2016-07-19 NOTE — Patient Instructions (Signed)
RICE for Routine Care of Injuries Many injuries can be cared for using rest, ice, compression, and elevation (RICE therapy). Using RICE therapy can help to lessen pain and swelling. It can help your body to heal. Rest Reduce your normal activities and avoid using the injured part of your body. You can go back to your normal activities when you feel okay and your doctor says it is okay. Ice Do not put ice on your bare skin.  Put ice in a plastic bag.  Place a towel between your skin and the bag.  Leave the ice on for 20 minutes, 2-3 times a day.  Do this for as long as told by your doctor. Compression Compression means putting pressure on the injured area. This can be done with an elastic bandage. If an elastic bandage has been applied:  Remove and reapply the bandage every 3-4 hours or as told by your doctor.  Make sure the bandage is not wrapped too tight. Wrap the bandage more loosely if part of your body beyond the bandage is blue, swollen, cold, painful, or loses feeling (numb).  See your doctor if the bandage seems to make your problems worse.  Elevation Elevation means keeping the injured area raised. Raise the injured area above your heart or the center of your chest if you can. When should I get help? You should get help if:  You keep having pain and swelling.  Your symptoms get worse.  Get help right away if: You should get help right away if:  You have sudden bad pain at or below the area of your injury.  You have redness or more swelling around your injury.  You have tingling or numbness at or below the injury that does not go away when you take off the bandage.  This information is not intended to replace advice given to you by your health care provider. Make sure you discuss any questions you have with your health care provider. Document Released: 06/16/2007 Document Revised: 11/25/2015 Document Reviewed: 12/05/2013 Elsevier Interactive Patient Education  2017  Elsevier Inc.  

## 2016-07-20 ENCOUNTER — Telehealth: Payer: Self-pay | Admitting: Nurse Practitioner

## 2016-07-21 NOTE — Telephone Encounter (Signed)
Spoke with pt regarding CBG It is common for elevation in BG while taking steroids Continue to monitor and watch diet appt scheduled for follow up Pt agrees with plan

## 2016-07-26 ENCOUNTER — Ambulatory Visit (INDEPENDENT_AMBULATORY_CARE_PROVIDER_SITE_OTHER): Payer: 59 | Admitting: Nurse Practitioner

## 2016-07-26 ENCOUNTER — Encounter: Payer: Self-pay | Admitting: Nurse Practitioner

## 2016-07-26 VITALS — BP 122/69 | HR 66 | Temp 98.0°F | Ht 71.25 in | Wt 187.0 lb

## 2016-07-26 DIAGNOSIS — M25561 Pain in right knee: Secondary | ICD-10-CM

## 2016-07-26 NOTE — Progress Notes (Signed)
   Subjective:    Patient ID: Tony Hodges, male    DOB: 11-19-1950, 66 y.o.   MRN: [id number]  HPI Patient fell down steps over 10 days ago and injured right knee. He was seen on 07/19/16 initially and was given prednisone dose pack. He comes back today saying knee is some better but still painful when walking long distances and still has fluid on it. Would like to see specialist to make sure there are not some tears inside.    Review of Systems  Constitutional: Negative.   HENT: Negative.   Respiratory: Negative.   Cardiovascular: Negative.   Neurological: Negative.   Psychiatric/Behavioral: Negative.   All other systems reviewed and are negative.      Objective:   Physical Exam  Constitutional: He is oriented to person, place, and time. He appears well-developed and well-nourished.  Cardiovascular: Normal rate, regular rhythm and normal heart sounds.   Pulmonary/Chest: Effort normal and breath sounds normal.  Musculoskeletal:  Mild medial right knee effusion Pain on full extension of right knee.   Neurological: He is alert and oriented to person, place, and time.  Skin: Skin is warm.  Psychiatric: He has a normal mood and affect. His behavior is normal. Judgment and thought content normal.   BP 122/69   Pulse 66   Temp 98 F (36.7 C) (Oral)   Ht 5' 11.25" (1.81 m)   Wt 187 lb (84.8 kg)   BMI 25.90 kg/m      Assessment & Plan:   1. Acute pain of right knee    Orders Placed This Encounter  Procedures  . Ambulatory referral to Orthopedic Surgery    Referral Priority:   Urgent    Referral Type:   Surgical    Referral Reason:   Specialty Services Required    Requested Specialty:   Orthopedic Surgery    Number of Visits Requested:   1   Wear knee brace when up walking Motrin OTC as needed RTO prn  Mary-Margaret Hassell Done, FNP

## 2016-07-26 NOTE — Patient Instructions (Signed)
RICE for Routine Care of Injuries Many injuries can be cared for using rest, ice, compression, and elevation (RICE therapy). Using RICE therapy can help to lessen pain and swelling. It can help your body to heal. Rest Reduce your normal activities and avoid using the injured part of your body. You can go back to your normal activities when you feel okay and your doctor says it is okay. Ice Do not put ice on your bare skin.  Put ice in a plastic bag.  Place a towel between your skin and the bag.  Leave the ice on for 20 minutes, 2-3 times a day.  Do this for as long as told by your doctor. Compression Compression means putting pressure on the injured area. This can be done with an elastic bandage. If an elastic bandage has been applied:  Remove and reapply the bandage every 3-4 hours or as told by your doctor.  Make sure the bandage is not wrapped too tight. Wrap the bandage more loosely if part of your body beyond the bandage is blue, swollen, cold, painful, or loses feeling (numb).  See your doctor if the bandage seems to make your problems worse.  Elevation Elevation means keeping the injured area raised. Raise the injured area above your heart or the center of your chest if you can. When should I get help? You should get help if:  You keep having pain and swelling.  Your symptoms get worse.  Get help right away if: You should get help right away if:  You have sudden bad pain at or below the area of your injury.  You have redness or more swelling around your injury.  You have tingling or numbness at or below the injury that does not go away when you take off the bandage.  This information is not intended to replace advice given to you by your health care provider. Make sure you discuss any questions you have with your health care provider. Document Released: 06/16/2007 Document Revised: 11/25/2015 Document Reviewed: 12/05/2013 Elsevier Interactive Patient Education  2017  Elsevier Inc.  

## 2016-09-06 ENCOUNTER — Other Ambulatory Visit: Payer: Self-pay | Admitting: Family Medicine

## 2016-09-11 ENCOUNTER — Other Ambulatory Visit: Payer: Self-pay | Admitting: Family Medicine

## 2016-09-15 ENCOUNTER — Encounter: Payer: Self-pay | Admitting: Family Medicine

## 2016-09-15 ENCOUNTER — Ambulatory Visit (INDEPENDENT_AMBULATORY_CARE_PROVIDER_SITE_OTHER): Payer: 59 | Admitting: Family Medicine

## 2016-09-15 VITALS — BP 125/76 | HR 73 | Temp 98.7°F | Ht 71.25 in | Wt 188.0 lb

## 2016-09-15 DIAGNOSIS — Z7689 Persons encountering health services in other specified circumstances: Secondary | ICD-10-CM | POA: Diagnosis not present

## 2016-09-15 DIAGNOSIS — E0800 Diabetes mellitus due to underlying condition with hyperosmolarity without nonketotic hyperglycemic-hyperosmolar coma (NKHHC): Secondary | ICD-10-CM

## 2016-09-15 DIAGNOSIS — E119 Type 2 diabetes mellitus without complications: Secondary | ICD-10-CM

## 2016-09-15 DIAGNOSIS — M25561 Pain in right knee: Secondary | ICD-10-CM

## 2016-09-15 DIAGNOSIS — I10 Essential (primary) hypertension: Secondary | ICD-10-CM | POA: Diagnosis not present

## 2016-09-15 LAB — BAYER DCA HB A1C WAIVED: HB A1C (BAYER DCA - WAIVED): 7.1 % — ABNORMAL HIGH (ref <7.0)

## 2016-09-15 NOTE — Progress Notes (Signed)
   HPI  Patient presents today here for follow-up for chronic medical conditions as well as right knee pain.  Right knee pain Patient has received a recent MRI and has follow-up with orthopedics.  Blood pressure Doing well, no medication problems. No chest pain or headaches.  Diabetes. Patient with good medication compliance Average fasting blood sugar 130 140 This has changed with prednisone. This was prescribed for knee pain. Patient watching diet closely.  Hyperlipidemia Patient taking 10 mg of Lipitor daily  PMH: Smoking status noted ROS: Per HPI  Objective: BP 125/76   Pulse 73   Temp 98.7 F (37.1 C) (Oral)   Ht 5' 11.25" (1.81 m)   Wt 188 lb (85.3 kg)   BMI 26.04 kg/m  Gen: NAD, alert, cooperative with exam HEENT: NCAT CV: RRR, good S1/S2, no murmur Resp: CTABL, no wheezes, non-labored Ext: No edema, warm Neuro: Alert and oriented, No gross deficits MSK: R knee without erythema, effusion, bruising, or gross deformity- possible medial effusion No joint line tenderness.  ligamentously intact to Lachman's and with varus and valgus stress.  Negative McMurray's test   Assessment and plan:  # Type 2 diabetes Possible slight worsening with prednisone course in 3-4 weeks of uncontrolled sugars due to knee pain. A1c pending Continue current medications including kombiglyze and actos  # Hypertension Well-controlled on current medications, no changes, labs up-to-date  # Right knee pain Stable, discussed for several minutes Patient has MRI pending and follow-up orthopedics planned.    Orders Placed This Encounter  Procedures  . [facility] Hb A1c Carney Bern, MD [facility] 09/15/2016, 8:43 AM

## 2016-09-15 NOTE — Patient Instructions (Signed)
Great to see you!  Come back in 3 months, We wil send labs to mychart within 1 week.

## 2016-09-21 ENCOUNTER — Ambulatory Visit: Payer: 59 | Attending: Sports Medicine | Admitting: Physical Therapy

## 2016-09-21 ENCOUNTER — Encounter: Payer: Self-pay | Admitting: Physical Therapy

## 2016-09-21 DIAGNOSIS — M25561 Pain in right knee: Secondary | ICD-10-CM | POA: Diagnosis present

## 2016-09-21 DIAGNOSIS — R262 Difficulty in walking, not elsewhere classified: Secondary | ICD-10-CM | POA: Diagnosis present

## 2016-09-21 DIAGNOSIS — M25469 Effusion, unspecified knee: Secondary | ICD-10-CM | POA: Insufficient documentation

## 2016-09-21 DIAGNOSIS — M25661 Stiffness of right knee, not elsewhere classified: Secondary | ICD-10-CM | POA: Diagnosis present

## 2016-09-21 DIAGNOSIS — M6281 Muscle weakness (generalized): Secondary | ICD-10-CM | POA: Diagnosis present

## 2016-09-21 NOTE — Therapy (Signed)
[facility] [place], [place], [address] Phone: [phone]   Fax:  [phone]  Physical Therapy Evaluation  Patient Details  Name: Tony Hodges MRN: [id number] Date of Birth: 06-Aug-1950 Referring Provider: Dr Delilah Shan  Encounter Date: 09/21/2016      PT End of Session - 09/21/16 1223    Visit Number 1   Number of Visits 12   PT Start Time 1115   PT Stop Time 1215   PT Time Calculation (min) 60 min   Activity Tolerance Patient tolerated treatment well   Behavior During Therapy [facility] for tasks assessed/performed      Past Medical History:  Diagnosis Date  . Diabetes mellitus   . Erectile dysfunction   . Hyperlipidemia   . Hypertension     Past Surgical History:  Procedure Laterality Date  . UMBILICAL HERNIA REPAIR  2010    There were no vitals filed for this visit.       Subjective Assessment - 09/21/16 1125    Subjective Pt s/p Right MCL tear sprain on 07/17/16 when falling down a flight of stairs. Pt reporting he has been walking but has been limping and is now having mild right hip pain intermittently. Pt reporting no pain at rest, Pt reporting bending/turning/twisting can cause pain.    How long can you sit comfortably? unlimited   How long can you stand comfortably? unlimited   How long can you walk comfortably? 15 minutes   Currently in Pain? No/denies            [facility] PT Assessment - 09/21/16 0001      Assessment   Medical Diagnosis right MCL tear and sprain   Referring Provider Dr Delilah Shan   Hand Dominance Right   Prior Therapy no     Precautions   Precautions None     Restrictions   Weight Bearing Restrictions No     Balance Screen   Has the patient fallen in the past 6 months Yes   How many times? 1   Has the patient had a decrease in activity level because of a fear of falling?  Yes   Is the patient reluctant to leave their home because of a fear of falling?  No     Home  [place] residence   Living Arrangements Spouse/significant other   Available Help at Discharge Family   Type of [place] to enter   Home Layout Two level   Alternate Level Stairs-Number of Steps 15   Alternate Level Stairs-Rails Right   Home Equipment None     Prior Function   Level of Independence Independent   Vocation Full time employment   Vocation Requirements [employer]   Leisure golf     Cognition   Overall Cognitive Status Within Functional Limits for tasks assessed     ROM / Strength   AROM / PROM / Strength AROM;PROM;Strength     AROM   Overall AROM  Deficits   AROM Assessment Site Knee   Right/Left Knee Right;Left   Right Knee Extension 20   Right Knee Flexion 122   Left Knee Extension 5   Left Knee Flexion 132     PROM   PROM Assessment Site Knee   Right/Left Knee Right   Right Knee Extension 18   Right Knee Flexion 125     Strength   Strength Assessment Site Knee   Right/Left Knee  Right   Right Knee Flexion 4/5   Right Knee Extension 4/5     Palpation   Palpation comment Pt with tenderness in medial knee, mild effusion noted with patella compression and glides     Transfers   Comments Sit to stand with no UE support     Ambulation/Gait   Ambulation Distance (Feet) 30 Feet   Assistive device None   Gait Pattern Decreased dorsiflexion - right;Antalgic   Ambulation Surface Level;Indoor   Gait Comments Pt edu in heel to toe gait pattern to normalize step length and improve knee extension with heel strike.             Objective measurements completed on examination: See above findings.          [name] - 09/21/16 0001      Modalities   Modalities Electrical Stimulation;Vasopneumatic     Acupuncturist Location knee   Electrical Stimulation Action IFC   Electrical Stimulation Parameters 80-150 Hz, x 15 minutes,  intensity to tolerance   Electrical Stimulation Goals Edema;Pain     Vasopneumatic   Number Minutes Vasopneumatic  15 minutes   Vasopnuematic Location  Knee   Vasopneumatic Pressure Low   Vasopneumatic Temperature  36     Manual Therapy   Manual therapy comments patella mobs                PT Education - 09/21/16 1223    Education provided Yes   Education Details HEP   Person(s) Educated Patient   Methods Explanation;Demonstration;Verbal cues;Handout   Comprehension Verbalized understanding;Returned demonstration             PT Long Term Goals - 09/21/16 1232      PT LONG TERM GOAL #1   Title Pt will be independent in his HEP   Time 6   Period Weeks   Status New   Target Date 11/02/16     PT LONG TERM GOAL #2   Title Pt will be able to demonstrate a normal gait pattern with heel strike noted with no pain.    Time 6   Period Weeks   Status New   Target Date 11/02/16     PT LONG TERM GOAL #3   Title Pt will improve his R LE knee extension to </= 7 degrees in order to improve functional mobility and gait.    Time 6   Period Weeks   Status New   Target Date 11/02/16     PT LONG TERM GOAL #4   Title Pt will improve his R LE knee strength to 5/5 to improve gait and functional mobility.    Time 6   Period Weeks   Status New   Target Date 11/02/16                Plan - 09/21/16 1224    Clinical Impression Statement Patient arriving to therapy as a low complexity evalaution. Pt s/p fall down the stairs with right MCL tear and sprain. Pt with decreased knee extension in R LE and and lacking 10degrees of active ROM in flexion compared to his left knee. Pt with mild weakness of 4/5 in R knee flexion and extension. Pt also with decreased DF and foot clearance on the right compared to the left. Skilled PT needed to address the listed impairments with the below interventions.     Clinical Presentation Stable   Clinical Decision Making Low  Rehab  Potential Excellent   PT Frequency 2x / week   PT Duration 6 weeks   PT Next Visit Plan bike, LE strengthening, hamstring stretching, knee extension stretches, lunges for hip flexor stretching, IT band stretching, E-stim, vaso   PT Home Exercise Plan prone knee hangs, hamstring stretches, hip flexor stretches, SLR, quad sets   Consulted and Agree with Plan of Care Patient      Patient will benefit from skilled therapeutic intervention in order to improve the following deficits and impairments:  Abnormal gait, Pain, Decreased activity tolerance, Decreased strength, Difficulty walking, Impaired flexibility, Decreased range of motion  Visit Diagnosis: Acute pain of right knee - Plan: PT plan of care cert/re-cert  Difficulty in walking, not elsewhere classified - Plan: PT plan of care cert/re-cert  Stiffness of right knee, not elsewhere classified - Plan: PT plan of care cert/re-cert  Knee swelling - Plan: PT plan of care cert/re-cert  Muscle weakness (generalized) - Plan: PT plan of care cert/re-cert     Problem List Patient Active Problem List   Diagnosis Date Noted  . BPH (benign prostatic hyperplasia) 11/25/2015  . Sprain and strain of metatarsophalangeal joint 04/02/2015  . Erectile dysfunction   . Diabetes mellitus ([place])   . HLD (hyperlipidemia) 05/25/2012  . HTN (hypertension) 05/25/2012    Oretha Caprice, MPT 09/21/2016, 12:46 PM  [facility] [address] [place], [place], [address] Phone: [phone]   Fax:  [phone]  Name: Tony Hodges MRN: [id number] Date of Birth: 11/27/1950

## 2016-09-21 NOTE — Patient Instructions (Signed)
  15- 20 times, holding 10 seconds 2 times a day   Up to 10 minutes, Twice a day   15-20 times, holding 2-3 seconds Twice a day   3-5 times, Hold 30 seconds Twice a day

## 2016-09-23 ENCOUNTER — Ambulatory Visit: Payer: 59 | Admitting: *Deleted

## 2016-09-23 DIAGNOSIS — R262 Difficulty in walking, not elsewhere classified: Secondary | ICD-10-CM

## 2016-09-23 DIAGNOSIS — M25661 Stiffness of right knee, not elsewhere classified: Secondary | ICD-10-CM

## 2016-09-23 DIAGNOSIS — M25561 Pain in right knee: Secondary | ICD-10-CM | POA: Diagnosis not present

## 2016-09-23 DIAGNOSIS — M25469 Effusion, unspecified knee: Secondary | ICD-10-CM

## 2016-09-23 DIAGNOSIS — M6281 Muscle weakness (generalized): Secondary | ICD-10-CM

## 2016-09-23 NOTE — Therapy (Signed)
[facility] [place], [place], [address] Phone: [phone]   Fax:  [phone]  Physical Therapy Treatment  Patient Details  Name: Tony Hodges MRN: [id number] Date of Birth: July 30, 1950 Referring Provider: Dr Delilah Shan  Encounter Date: 09/23/2016      PT End of Session - 09/23/16 1614    Visit Number 2   Number of Visits 12   PT Start Time 1600   PT Stop Time 1750   PT Time Calculation (min) 110 min      Past Medical History:  Diagnosis Date  . Diabetes mellitus   . Erectile dysfunction   . Hyperlipidemia   . Hypertension     Past Surgical History:  Procedure Laterality Date  . UMBILICAL HERNIA REPAIR  2010    There were no vitals filed for this visit.      Subjective Assessment - 09/23/16 1611    Subjective Pt s/p Right MCL tear sprain on 07/17/16 when falling down a flight of stairs. Pt reporting he has been walking but has been limping and is now having mild right hip pain intermittently. Pt reporting no pain at rest, Pt reporting bending/turning/twisting can cause pain.    How long can you sit comfortably? unlimited   How long can you stand comfortably? unlimited   How long can you walk comfortably? 15 minutes   Currently in Pain? No/denies                         [facility] Adult PT Treatment/Exercise - 09/23/16 0001      Exercises   Exercises Knee/Hip     Knee/Hip Exercises: Aerobic   Stationary Bike x 10 mins  L2-3     Knee/Hip Exercises: Standing   Rocker Board 3 minutes  calf stretching     Knee/Hip Exercises: Sidelying   Hip ABduction AROM;Right;3 sets;10 reps     Modalities   Modalities Electrical Stimulation;Vasopneumatic;Ultrasound     Acupuncturist Location knee IFC 1-10hz  x 15 mins   Electrical Stimulation Goals Edema;Pain     Ultrasound   Ultrasound Location RT MCL   Ultrasound Parameters 1.5 w/cm2 x 8 mins   Ultrasound Goals  Pain     Vasopneumatic   Number Minutes Vasopneumatic  15 minutes   Vasopnuematic Location  Knee   Vasopneumatic Pressure Low   Vasopneumatic Temperature  36                     PT Long Term Goals - 09/21/16 1232      PT LONG TERM GOAL #1   Title Pt will be independent in his HEP   Time 6   Period Weeks   Status New   Target Date 11/02/16     PT LONG TERM GOAL #2   Title Pt will be able to demonstrate a normal gait pattern with heel strike noted with no pain.    Time 6   Period Weeks   Status New   Target Date 11/02/16     PT LONG TERM GOAL #3   Title Pt will improve his R LE knee extension to </= 7 degrees in order to improve functional mobility and gait.    Time 6   Period Weeks   Status New   Target Date 11/02/16     PT LONG TERM GOAL #4   Title Pt will improve his R LE knee strength to 5/5  to improve gait and functional mobility.    Time 6   Period Weeks   Status New   Target Date 11/02/16               Plan - 09/23/16 1614    Clinical Impression Statement Pt arrived today doing fairly well with minimal pain RT knee MCL area. He was able to perform therex with minimal pain and did well with modalities. -8 degrees for extension today.   Clinical Decision Making Low   Rehab Potential Excellent   PT Frequency 2x / week   PT Duration 6 weeks   PT Next Visit Plan bike, LE strengthening, hamstring stretching, knee extension stretches, lunges for hip flexor stretching, IT band stretching, E-stim, vaso   PT Home Exercise Plan prone knee hangs, hamstring stretches, hip flexor stretches, SLR, quad sets   Consulted and Agree with Plan of Care Patient      Patient will benefit from skilled therapeutic intervention in order to improve the following deficits and impairments:  Abnormal gait, Pain, Decreased activity tolerance, Decreased strength, Difficulty walking, Impaired flexibility, Decreased range of motion  Visit Diagnosis: Acute pain of right  knee  Difficulty in walking, not elsewhere classified  Stiffness of right knee, not elsewhere classified  Knee swelling  Muscle weakness (generalized)     Problem List Patient Active Problem List   Diagnosis Date Noted  . BPH (benign prostatic hyperplasia) 11/25/2015  . Sprain and strain of metatarsophalangeal joint 04/02/2015  . Erectile dysfunction   . Diabetes mellitus ([place])   . HLD (hyperlipidemia) 05/25/2012  . HTN (hypertension) 05/25/2012    Tin Engram,CHRIS, PTA 09/23/2016, 5:53 PM  [facility] [place], [place], [address] Phone: [phone]   Fax:  [phone]  Name: Tony Hodges MRN: [id number] Date of Birth: 12-09-1950

## 2016-09-28 ENCOUNTER — Ambulatory Visit: Payer: 59 | Admitting: Physical Therapy

## 2016-09-28 DIAGNOSIS — M25561 Pain in right knee: Secondary | ICD-10-CM | POA: Diagnosis not present

## 2016-09-28 DIAGNOSIS — R262 Difficulty in walking, not elsewhere classified: Secondary | ICD-10-CM

## 2016-09-28 NOTE — Therapy (Signed)
[facility] [place], [place], [address] Phone: [phone]   Fax:  [phone]  Physical Therapy Treatment  Patient Details  Name: Tony Hodges MRN: [id number] Date of Birth: May 05, 1950 Referring Provider: Dr Delilah Shan  Encounter Date: 09/28/2016      PT End of Session - 09/28/16 1017    Visit Number 3   Number of Visits 12   PT Start Time 0900   PT Stop Time 0951   PT Time Calculation (min) 51 min      Past Medical History:  Diagnosis Date  . Diabetes mellitus   . Erectile dysfunction   . Hyperlipidemia   . Hypertension     Past Surgical History:  Procedure Laterality Date  . UMBILICAL HERNIA REPAIR  2010    There were no vitals filed for this visit.                       [facility] Adult PT Treatment/Exercise - 09/28/16 0001      Exercises   Exercises Knee/Hip     Knee/Hip Exercises: Aerobic   Stationary Bike Level 4 x 15 minutes.     Acupuncturist Location Right MCL area.   Electrical Stimulation Action Pre-mod.   Electrical Stimulation Parameters 80-150 Hz x 20 minutes.     Ultrasound   Ultrasound Location Rt MCL   Ultrasound Parameters Combo e'stim/U/S at 1.50 W/Cm2 x 8 minutes.   Ultrasound Goals Pain                PT Education - 09/28/16 1226    Education provided Yes   Education Details Instruct in short duration ice massage.   Person(s) Educated Patient   Methods Explanation   Comprehension Verbalized understanding             PT Long Term Goals - 09/21/16 1232      PT LONG TERM GOAL #1   Title Pt will be independent in his HEP   Time 6   Period Weeks   Status New   Target Date 11/02/16     PT LONG TERM GOAL #2   Title Pt will be able to demonstrate a normal gait pattern with heel strike noted with no pain.    Time 6   Period Weeks   Status New   Target Date 11/02/16     PT LONG TERM GOAL #3   Title Pt will  improve his R LE knee extension to </= 7 degrees in order to improve functional mobility and gait.    Time 6   Period Weeks   Status New   Target Date 11/02/16     PT LONG TERM GOAL #4   Title Pt will improve his R LE knee strength to 5/5 to improve gait and functional mobility.    Time 6   Period Weeks   Status New   Target Date 11/02/16               Plan - 09/28/16 1227    Clinical Impression Statement Patient did well today.  He was tender to palpation directly over the right MCL at the joint line.   PT Next Visit Plan Sending order for Iontophoresis.  Please begin when order is signed.      Patient will benefit from skilled therapeutic intervention in order to improve the following deficits and impairments:  Abnormal gait, Pain, Decreased activity tolerance, Decreased strength, Difficulty  walking, Impaired flexibility, Decreased range of motion  Visit Diagnosis: Acute pain of right knee  Difficulty in walking, not elsewhere classified     Problem List Patient Active Problem List   Diagnosis Date Noted  . BPH (benign prostatic hyperplasia) 11/25/2015  . Sprain and strain of metatarsophalangeal joint 04/02/2015  . Erectile dysfunction   . Diabetes mellitus ([place])   . HLD (hyperlipidemia) 05/25/2012  . HTN (hypertension) 05/25/2012    Jonetta Dagley, [place] MPT 09/28/2016, 12:28 PM  [facility] [address] [place], [place], [address] Phone: [phone]   Fax:  [phone]  Name: Tony Hodges MRN: [id number] Date of Birth: 1951/01/09

## 2016-09-30 ENCOUNTER — Ambulatory Visit: Payer: 59 | Admitting: Physical Therapy

## 2016-09-30 ENCOUNTER — Encounter: Payer: Self-pay | Admitting: Physical Therapy

## 2016-09-30 DIAGNOSIS — M25661 Stiffness of right knee, not elsewhere classified: Secondary | ICD-10-CM

## 2016-09-30 DIAGNOSIS — M25469 Effusion, unspecified knee: Secondary | ICD-10-CM

## 2016-09-30 DIAGNOSIS — M25561 Pain in right knee: Secondary | ICD-10-CM | POA: Diagnosis not present

## 2016-09-30 DIAGNOSIS — R262 Difficulty in walking, not elsewhere classified: Secondary | ICD-10-CM

## 2016-09-30 DIAGNOSIS — M6281 Muscle weakness (generalized): Secondary | ICD-10-CM

## 2016-09-30 NOTE — Therapy (Signed)
[facility] [place], [place], [address] Phone: [phone]   Fax:  [phone]  Physical Therapy Treatment  Patient Details  Name: Tony Hodges MRN: [id number] Date of Birth: [birth year] Referring Provider: Dr Delilah Shan  Encounter Date: 09/30/2016      PT End of Session - 09/30/16 1200    Visit Number 4   Number of Visits 12   PT Start Time 1115   PT Stop Time 1210   PT Time Calculation (min) 55 min   Activity Tolerance Patient tolerated treatment well   Behavior During Therapy [facility] for tasks assessed/performed      Past Medical History:  Diagnosis Date  . Diabetes mellitus   . Erectile dysfunction   . Hyperlipidemia   . Hypertension     Past Surgical History:  Procedure Laterality Date  . UMBILICAL HERNIA REPAIR  2010    There were no vitals filed for this visit.      Subjective Assessment - 09/30/16 1154    Subjective Pt s/p Right MCL tear sprain on 07/17/16 when falling down a flight of stairs. Pt reporting no pain at rest. Pt reporting bending/turning/twisting can cause pain pending the position. Pt also reporting working hard on prone knee hangs   Pertinent History MCL   Currently in Pain? No/denies            [facility] PT Assessment - 09/30/16 0001      AROM   Right Knee Extension 8   Right Knee Flexion 122     PROM   Right Knee Extension 5   Right Knee Flexion 125                     [name] - 09/30/16 0001      Exercises   Exercises Knee/Hip     Knee/Hip Exercises: Aerobic   Stationary Bike Level 5 x 15 minutes.     Knee/Hip Exercises: Machines for Strengthening   Cybex Leg Press 3 plates with R LE x 20 reps     Knee/Hip Exercises: Standing   Rocker Board 3 minutes   Other Standing Knee Exercises forward lunges with R LE on step with single UE support     Knee/Hip Exercises: Supine   Quad Sets AROM;10 reps;Limitations   Quad Sets Limitations  with overpressure     Modalities   Modalities Electrical Stimulation;Ultrasound     Acupuncturist Stimulation Location Right MCL area.   Electrical Stimulation Action pre-mod   Electrical Stimulation Parameters 80-150 Hz x 15 minutes     Ultrasound   Ultrasound Location --   Ultrasound Parameters --   Ultrasound Goals --     Vasopneumatic   Number Minutes Vasopneumatic  15 minutes   Vasopnuematic Location  Knee   Vasopneumatic Pressure Medium   Vasopneumatic Temperature  34     Manual Therapy   Manual Therapy Passive ROM   Manual therapy comments patella mobs, PROM to left knee extension with overpressure    Passive ROM knee extension with overpressure holding 20-30 seconds                PT Education - 09/30/16 1200    Education Details Reviewed HEP   Person(s) Educated Patient   Methods Explanation;Demonstration   Comprehension Verbalized understanding             PT Long Term Goals - 09/30/16 1213      PT LONG  TERM GOAL #1   Title Pt will be independent in his HEP   Status On-going     PT LONG TERM GOAL #2   Title Pt will be able to demonstrate a normal gait pattern with heel strike noted with no pain.    Period Weeks   Status On-going     PT LONG TERM GOAL #3   Title Pt will improve his R LE knee extension to </= 7 degrees in order to improve functional mobility and gait.    Time 6   Period Weeks   Status On-going     PT LONG TERM GOAL #4   Title Pt will improve his R LE knee strength to 5/5 to improve gait and functional mobility.    Time 6   Period Weeks   Status New               Plan - 09/30/16 1201    Clinical Impression Statement Patient tolerated well today, performing closed kinetic chain exericses. Pt has made progress with L knee extension ROM from 20 degrees to 8 degrees.  Continue skilled PT to progress toward LTG's set.    Rehab Potential Excellent   PT Frequency 2x / week   PT Duration 6 weeks    PT Next Visit Plan Sending order for Iontophoresis.  Please begin when order is signed.   PT Home Exercise Plan prone knee hangs, hamstring stretches, hip flexor stretches, SLR, quad sets      Patient will benefit from skilled therapeutic intervention in order to improve the following deficits and impairments:  Abnormal gait, Pain, Decreased activity tolerance, Decreased strength, Difficulty walking, Impaired flexibility, Decreased range of motion  Visit Diagnosis: Acute pain of right knee  Difficulty in walking, not elsewhere classified  Stiffness of right knee, not elsewhere classified  Knee swelling  Muscle weakness (generalized)     Problem List Patient Active Problem List   Diagnosis Date Noted  . BPH (benign prostatic hyperplasia) 11/25/2015  . Sprain and strain of metatarsophalangeal joint 04/02/2015  . Erectile dysfunction   . Diabetes mellitus ([place])   . HLD (hyperlipidemia) 05/25/2012  . HTN (hypertension) 05/25/2012    Tony Hodges, MPT 09/30/2016, 12:16 PM  [facility] [address] [place], [place], [address] Phone: [phone]   Fax:  [phone]  Name: Tony Hodges MRN: [id number] Date of Birth: Mar 15, 1950

## 2016-10-01 ENCOUNTER — Encounter: Payer: Self-pay | Admitting: *Deleted

## 2016-10-05 ENCOUNTER — Encounter: Payer: Self-pay | Admitting: Physical Therapy

## 2016-10-05 ENCOUNTER — Ambulatory Visit: Payer: 59 | Admitting: Physical Therapy

## 2016-10-05 DIAGNOSIS — M6281 Muscle weakness (generalized): Secondary | ICD-10-CM

## 2016-10-05 DIAGNOSIS — R262 Difficulty in walking, not elsewhere classified: Secondary | ICD-10-CM

## 2016-10-05 DIAGNOSIS — M25661 Stiffness of right knee, not elsewhere classified: Secondary | ICD-10-CM

## 2016-10-05 DIAGNOSIS — M25561 Pain in right knee: Secondary | ICD-10-CM | POA: Diagnosis not present

## 2016-10-05 DIAGNOSIS — M25469 Effusion, unspecified knee: Secondary | ICD-10-CM

## 2016-10-05 NOTE — Therapy (Signed)
[facility] [place], [place], [address] Phone: [phone]   Fax:  [phone]  Physical Therapy Treatment  Patient Details  Name: Tony Hodges MRN: [id number] Date of Birth: Oct 10, 1950 Referring Provider: Dr Delilah Shan  Encounter Date: 10/05/2016      PT End of Session - 10/05/16 0903    Visit Number 5   Number of Visits 12   PT Start Time 0902   PT Stop Time 0949   PT Time Calculation (min) 47 min   Activity Tolerance Patient tolerated treatment well   Behavior During Therapy [facility] for tasks assessed/performed      Past Medical History:  Diagnosis Date  . Diabetes mellitus   . Erectile dysfunction   . Hyperlipidemia   . Hypertension     Past Surgical History:  Procedure Laterality Date  . UMBILICAL HERNIA REPAIR  2010    There were no vitals filed for this visit.      Subjective Assessment - 10/05/16 0902    Subjective Reports that his knee is good although still sore along R MCL. Reports that he is still limping a little and stiff in the morning.   Pertinent History MCL   How long can you sit comfortably? unlimited   How long can you stand comfortably? unlimited   How long can you walk comfortably? 15 minutes   Currently in Pain? No/denies            [facility] PT Assessment - 10/05/16 0001      Assessment   Medical Diagnosis right MCL tear and sprain   Onset Date/Surgical Date 07/17/16   Hand Dominance Right   Next MD Visit 10/2016   Prior Therapy no     Precautions   Precautions None     Restrictions   Weight Bearing Restrictions No     ROM / Strength   AROM / PROM / Strength AROM     AROM   Overall AROM  Within functional limits for tasks performed   AROM Assessment Site Knee   Right/Left Knee Right   Right Knee Extension 3   Right Knee Flexion 136                     [name] - 10/05/16 0001      Knee/Hip Exercises: Stretches   Passive  Hamstring Stretch Right;3 reps;30 seconds     Knee/Hip Exercises: Aerobic   Stationary Bike x12 min     Knee/Hip Exercises: Machines for Strengthening   Cybex Knee Extension 10# RLE x20 reps   Cybex Knee Flexion 30# RLE x20 reps   Cybex Leg Press 3 pl, seat 8, x15 reps BLE, x20 reps RLE     Knee/Hip Exercises: Supine   Bridges with [employer] Strengthening;Both;2 sets;10 reps  4# ball   Straight Leg Raises Strengthening;Right;2 sets;10 reps   Straight Leg Raise with External Rotation Strengthening;Right;2 sets;10 reps     Knee/Hip Exercises: Sidelying   Hip ABduction Strengthening;Right;2 sets;10 reps     Modalities   Modalities Vasopneumatic     Vasopneumatic   Number Minutes Vasopneumatic  165 minutes   Vasopnuematic Location  Knee   Vasopneumatic Pressure Medium   Vasopneumatic Temperature  64                     PT Long Term Goals - 10/05/16 0944      PT LONG TERM GOAL #1   Title Pt  will be independent in his HEP   Status Achieved     PT LONG TERM GOAL #2   Title Pt will be able to demonstrate a normal gait pattern with heel strike noted with no pain.    Period Weeks   Status On-going     PT LONG TERM GOAL #3   Title Pt will improve his R LE knee extension to </= 7 degrees in order to improve functional mobility and gait.    Time 6   Period Weeks   Status Achieved  AROM R knee extension 3 deg from full extension 10/05/2016     PT LONG TERM GOAL #4   Title Pt will improve his R LE knee strength to 5/5 to improve gait and functional mobility.    Time 6   Period Weeks   Status On-going               Plan - 10/05/16 0940    Clinical Impression Statement Patient presented in clinic with reports of only tenderness along the R MCL region. Patient able to complete LE strengthening wihtout complaint and reported compliance with HEP as he is eager to return to golf. Patient only experienced tenderness along R MCL with SLR with ER. AROM of R knee  improved to 3-136 deg today. Normal vasopneumatic response noted following removal of the modality. Patient able to achieve R knee extension goal today in clinic.   Rehab Potential Excellent   PT Frequency 2x / week   PT Duration 6 weeks   PT Next Visit Plan Sending order for Iontophoresis.  Please begin when order is signed.   PT Home Exercise Plan prone knee hangs, hamstring stretches, hip flexor stretches, SLR, quad sets   Consulted and Agree with Plan of Care Patient      Patient will benefit from skilled therapeutic intervention in order to improve the following deficits and impairments:  Abnormal gait, Pain, Decreased activity tolerance, Decreased strength, Difficulty walking, Impaired flexibility, Decreased range of motion  Visit Diagnosis: Acute pain of right knee  Difficulty in walking, not elsewhere classified  Stiffness of right knee, not elsewhere classified  Knee swelling  Muscle weakness (generalized)     Problem List Patient Active Problem List   Diagnosis Date Noted  . BPH (benign prostatic hyperplasia) 11/25/2015  . Sprain and strain of metatarsophalangeal joint 04/02/2015  . Erectile dysfunction   . Diabetes mellitus ([place])   . HLD (hyperlipidemia) 05/25/2012  . HTN (hypertension) 05/25/2012    Wynelle Fanny, PTA 10/05/2016, 9:51 AM  [facility] [address] [place], [place], [address] Phone: [phone]   Fax:  [phone]  Name: ARY LAVINE MRN: [id number] Date of Birth: 03-25-1950

## 2016-10-07 ENCOUNTER — Ambulatory Visit: Payer: 59 | Admitting: *Deleted

## 2016-10-07 DIAGNOSIS — M6281 Muscle weakness (generalized): Secondary | ICD-10-CM

## 2016-10-07 DIAGNOSIS — M25469 Effusion, unspecified knee: Secondary | ICD-10-CM

## 2016-10-07 DIAGNOSIS — R262 Difficulty in walking, not elsewhere classified: Secondary | ICD-10-CM

## 2016-10-07 DIAGNOSIS — M25661 Stiffness of right knee, not elsewhere classified: Secondary | ICD-10-CM

## 2016-10-07 DIAGNOSIS — M25561 Pain in right knee: Secondary | ICD-10-CM | POA: Diagnosis not present

## 2016-10-07 NOTE — Therapy (Signed)
[facility] [place], [place], [address] Phone: [phone]   Fax:  [phone]  Physical Therapy Treatment  Patient Details  Name: Tony Hodges MRN: [id number] Date of Birth: 1950/01/28 Referring Provider: Dr Delilah Shan  Encounter Date: 10/07/2016      PT End of Session - 10/07/16 0836    Visit Number 6   Number of Visits 12   PT Start Time 0820   PT Stop Time 0919   PT Time Calculation (min) 59 min      Past Medical History:  Diagnosis Date  . Diabetes mellitus   . Erectile dysfunction   . Hyperlipidemia   . Hypertension     Past Surgical History:  Procedure Laterality Date  . UMBILICAL HERNIA REPAIR  2010    There were no vitals filed for this visit.      Subjective Assessment - 10/07/16 0834    Subjective Reports that his knee is good although still sore along R MCL. Reports that he is still limping a little and stiff in the morning.   Pain 50% better  MD 10-29-16   Pertinent History MCL   How long can you sit comfortably? unlimited   How long can you stand comfortably? unlimited   How long can you walk comfortably? 15 minutes                         [name] - 10/07/16 0001      Knee/Hip Exercises: Aerobic   Stationary Bike Level 5 x 15 minutes.     Knee/Hip Exercises: Machines for Strengthening   Cybex Knee Extension 10# RLE x30 reps   Cybex Knee Flexion 30# RLE x30 reps   Cybex Leg Press 3 pl, seat 8, x15 reps BLE, x20 reps RLE     Modalities   Modalities Vasopneumatic     Vasopneumatic   Number Minutes Vasopneumatic  15 minutes   Vasopnuematic Location  Knee   Vasopneumatic Pressure Medium   Vasopneumatic Temperature  34     Manual Therapy   Manual Therapy Passive ROM   Manual therapy comments patella mobs, PROM to left knee extension with overpressure                      PT Long Term Goals - 10/05/16 0944      PT LONG TERM  GOAL #1   Title Pt will be independent in his HEP   Status Achieved     PT LONG TERM GOAL #2   Title Pt will be able to demonstrate a normal gait pattern with heel strike noted with no pain.    Period Weeks   Status On-going     PT LONG TERM GOAL #3   Title Pt will improve his R LE knee extension to </= 7 degrees in order to improve functional mobility and gait.    Time 6   Period Weeks   Status Achieved  AROM R knee extension 3 deg from full extension 10/05/2016     PT LONG TERM GOAL #4   Title Pt will improve his R LE knee strength to 5/5 to improve gait and functional mobility.    Time 6   Period Weeks   Status On-going               Plan - 10/07/16 1696    Clinical Impression Statement Pt arrived today doing fairly well with  low pain levels in RT knee. He was able to perform all therex with minimal complaints and had  a normal response to Vaso. His flexion ROM is good at 136 degrees , but has a mild extension deficit at 3-5 degrees.   Rehab Potential Excellent   PT Frequency 2x / week   PT Duration 6 weeks   PT Next Visit Plan Sending order for Iontophoresis.  Please begin when order is signed.   PT Home Exercise Plan prone knee hangs, hamstring stretches, hip flexor stretches, SLR, quad sets   Consulted and Agree with Plan of Care Patient      Patient will benefit from skilled therapeutic intervention in order to improve the following deficits and impairments:  Abnormal gait, Pain, Decreased activity tolerance, Decreased strength, Difficulty walking, Impaired flexibility, Decreased range of motion  Visit Diagnosis: Acute pain of right knee  Difficulty in walking, not elsewhere classified  Stiffness of right knee, not elsewhere classified  Knee swelling  Muscle weakness (generalized)     Problem List Patient Active Problem List   Diagnosis Date Noted  . BPH (benign prostatic hyperplasia) 11/25/2015  . Sprain and strain of metatarsophalangeal joint  04/02/2015  . Erectile dysfunction   . Diabetes mellitus ([place])   . HLD (hyperlipidemia) 05/25/2012  . HTN (hypertension) 05/25/2012    Jonathon Tan,CHRIS 10/07/2016, 1:26 PM  [facility] [address] [place], [place], [address] Phone: [phone]   Fax:  [phone]  Name: CLINTEN HOWK MRN: [id number] Date of Birth: 04-02-1950

## 2016-10-07 NOTE — Therapy (Signed)
[facility] [place], [place], [address] Phone: [phone]   Fax:  [phone]  Physical Therapy Treatment  Patient Details  Name: Tony Hodges MRN: [id number] Date of Birth: 09-Jul-1950 Referring Provider: Dr Delilah Shan  Encounter Date: 10/07/2016      PT End of Session - 10/07/16 0836    Visit Number 6   Number of Visits 12   PT Start Time 0820   PT Stop Time 0906   PT Time Calculation (min) 46 min      Past Medical History:  Diagnosis Date  . Diabetes mellitus   . Erectile dysfunction   . Hyperlipidemia   . Hypertension     Past Surgical History:  Procedure Laterality Date  . UMBILICAL HERNIA REPAIR  2010    There were no vitals filed for this visit.      Subjective Assessment - 10/07/16 0834    Subjective Reports that his knee is good although still sore along R MCL. Reports that he is still limping a little and stiff in the morning.   Pain 50% better  MD 10-29-16   Pertinent History MCL   How long can you sit comfortably? unlimited   How long can you stand comfortably? unlimited   How long can you walk comfortably? 15 minutes                         [name] - 10/07/16 0001      Knee/Hip Exercises: Aerobic   Stationary Bike Level 5 x 15 minutes.     Knee/Hip Exercises: Machines for Strengthening   Cybex Knee Extension 10# RLE x30 reps   Cybex Knee Flexion 30# RLE x30 reps   Cybex Leg Press 3 pl, seat 8, x15 reps BLE, x20 reps RLE     Modalities   Modalities Vasopneumatic     Vasopneumatic   Number Minutes Vasopneumatic  15 minutes   Vasopnuematic Location  Knee   Vasopneumatic Pressure Medium   Vasopneumatic Temperature  34     Manual Therapy   Manual Therapy Passive ROM   Manual therapy comments patella mobs, PROM to left knee extension with overpressure                      PT Long Term Goals - 10/05/16 0944      PT LONG TERM  GOAL #1   Title Pt will be independent in his HEP   Status Achieved     PT LONG TERM GOAL #2   Title Pt will be able to demonstrate a normal gait pattern with heel strike noted with no pain.    Period Weeks   Status On-going     PT LONG TERM GOAL #3   Title Pt will improve his R LE knee extension to </= 7 degrees in order to improve functional mobility and gait.    Time 6   Period Weeks   Status Achieved  AROM R knee extension 3 deg from full extension 10/05/2016     PT LONG TERM GOAL #4   Title Pt will improve his R LE knee strength to 5/5 to improve gait and functional mobility.    Time 6   Period Weeks   Status On-going               Plan - 10/07/16 9518    Clinical Impression Statement Pt arrived today doing fairly well with  low pain levels in RT knee. He was able to perform all therex with minimal complaints and had  a normal response to Vaso. His flexion ROM is good at 136 degrees , but has a mild extension deficit at 3-5 degrees.   Rehab Potential Excellent   PT Frequency 2x / week   PT Duration 6 weeks   PT Next Visit Plan Sending order for Iontophoresis.  Please begin when order is signed.   PT Home Exercise Plan prone knee hangs, hamstring stretches, hip flexor stretches, SLR, quad sets   Consulted and Agree with Plan of Care Patient      Patient will benefit from skilled therapeutic intervention in order to improve the following deficits and impairments:  Abnormal gait, Pain, Decreased activity tolerance, Decreased strength, Difficulty walking, Impaired flexibility, Decreased range of motion  Visit Diagnosis: Acute pain of right knee  Difficulty in walking, not elsewhere classified  Stiffness of right knee, not elsewhere classified  Knee swelling  Muscle weakness (generalized)     Problem List Patient Active Problem List   Diagnosis Date Noted  . BPH (benign prostatic hyperplasia) 11/25/2015  . Sprain and strain of metatarsophalangeal joint  04/02/2015  . Erectile dysfunction   . Diabetes mellitus ([place])   . HLD (hyperlipidemia) 05/25/2012  . HTN (hypertension) 05/25/2012    RAMSEUR,CHRIS, PTA 10/07/2016, 1:23 PM  [facility] [address] [place], [place], [address] Phone: [phone]   Fax:  [phone]  Name: CEPHUS TUPY MRN: [id number] Date of Birth: [birth year]

## 2016-10-12 ENCOUNTER — Encounter: Payer: Self-pay | Admitting: Physical Therapy

## 2016-10-12 ENCOUNTER — Ambulatory Visit: Payer: 59 | Attending: Sports Medicine | Admitting: Physical Therapy

## 2016-10-12 DIAGNOSIS — M25469 Effusion, unspecified knee: Secondary | ICD-10-CM | POA: Insufficient documentation

## 2016-10-12 DIAGNOSIS — M25661 Stiffness of right knee, not elsewhere classified: Secondary | ICD-10-CM | POA: Insufficient documentation

## 2016-10-12 DIAGNOSIS — R262 Difficulty in walking, not elsewhere classified: Secondary | ICD-10-CM | POA: Insufficient documentation

## 2016-10-12 DIAGNOSIS — M6281 Muscle weakness (generalized): Secondary | ICD-10-CM

## 2016-10-12 DIAGNOSIS — M25561 Pain in right knee: Secondary | ICD-10-CM | POA: Insufficient documentation

## 2016-10-12 NOTE — Therapy (Signed)
[facility] [place], [place], [address] Phone: [phone]   Fax:  [phone]  Physical Therapy Treatment  Patient Details  Name: Tony Hodges MRN: [id number] Date of Birth: 12/29/1950 Referring Provider: Dr Delilah Shan  Encounter Date: 10/12/2016      PT End of Session - 10/12/16 1246    Visit Number 7   Number of Visits 12   PT Start Time 0906   PT Stop Time 0958   PT Time Calculation (min) 52 min   Activity Tolerance Patient tolerated treatment well   Behavior During Therapy [facility] for tasks assessed/performed      Past Medical History:  Diagnosis Date  . Diabetes mellitus   . Erectile dysfunction   . Hyperlipidemia   . Hypertension     Past Surgical History:  Procedure Laterality Date  . UMBILICAL HERNIA REPAIR  2010    There were no vitals filed for this visit.      Subjective Assessment - 10/12/16 1247    Subjective I'm doing at least "60%" better.  I'm going to try golfing this weekend.   Currently in Pain? Yes   Pain Score 2    Pain Location Knee   Pain Orientation Right   Pain Descriptors / Indicators Dull;Discomfort   Pain Type Acute pain   Pain Onset More than a month ago   Pain Frequency Intermittent                         [name] - 10/12/16 0001      Exercises   Exercises Knee/Hip     Knee/Hip Exercises: Aerobic   Stationary Bike Level 6 x 15 minutes.     Knee/Hip Exercises: Machines for Strengthening   Cybex Knee Extension 10# with right knee only over 5 minutes resting as needed as quad fatigued.   Cybex Knee Flexion 40# x 2 minutes.     Knee/Hip Exercises: Sidelying   Other Sidelying Knee/Hip Exercises Right hip abduction and "clamshell" to fatigue.     Modalities   Modalities Electrical Stimulation;Vasopneumatic     Electrical Stimulation   Electrical Stimulation Location Right medial knee.   Electrical Stimulation Action  Pre-mod.   Electrical Stimulation Parameters 80-150 Hz x 20 minutes.   Electrical Stimulation Goals Pain     Vasopneumatic   Number Minutes Vasopneumatic  20 minutes   Vasopnuematic Location  --  Righ knee.   Vasopneumatic Pressure Medium                     PT Long Term Goals - 10/05/16 0944      PT LONG TERM GOAL #1   Title Pt will be independent in his HEP   Status Achieved     PT LONG TERM GOAL #2   Title Pt will be able to demonstrate a normal gait pattern with heel strike noted with no pain.    Period Weeks   Status On-going     PT LONG TERM GOAL #3   Title Pt will improve his R LE knee extension to </= 7 degrees in order to improve functional mobility and gait.    Time 6   Period Weeks   Status Achieved  AROM R knee extension 3 deg from full extension 10/05/2016     PT LONG TERM GOAL #4   Title Pt will improve his R LE knee strength to 5/5 to improve gait and functional  mobility.    Time 6   Period Weeks   Status On-going               Plan - 10/12/16 1253    Clinical Impression Statement Excellent job thus far.  Right knee medial joint line tenderness diminished.      Patient will benefit from skilled therapeutic intervention in order to improve the following deficits and impairments:  Abnormal gait, Pain, Decreased activity tolerance, Decreased strength, Difficulty walking, Impaired flexibility, Decreased range of motion  Visit Diagnosis: Acute pain of right knee  Difficulty in walking, not elsewhere classified  Stiffness of right knee, not elsewhere classified  Knee swelling  Muscle weakness (generalized)     Problem List Patient Active Problem List   Diagnosis Date Noted  . BPH (benign prostatic hyperplasia) 11/25/2015  . Sprain and strain of metatarsophalangeal joint 04/02/2015  . Erectile dysfunction   . Diabetes mellitus ([place])   . HLD (hyperlipidemia) 05/25/2012  . HTN (hypertension) 05/25/2012    Syesha Thaw, [place]  MPT 10/12/2016, 12:55 PM  [facility] [address] [place], [place], [address] Phone: [phone]   Fax:  [phone]  Name: Tony Hodges MRN: [id number] Date of Birth: 08-26-1950

## 2016-10-14 ENCOUNTER — Encounter: Payer: Self-pay | Admitting: Physical Therapy

## 2016-10-14 ENCOUNTER — Ambulatory Visit: Payer: 59 | Admitting: Physical Therapy

## 2016-10-14 DIAGNOSIS — M25561 Pain in right knee: Secondary | ICD-10-CM | POA: Diagnosis not present

## 2016-10-14 DIAGNOSIS — M25661 Stiffness of right knee, not elsewhere classified: Secondary | ICD-10-CM

## 2016-10-14 DIAGNOSIS — M25469 Effusion, unspecified knee: Secondary | ICD-10-CM

## 2016-10-14 DIAGNOSIS — M6281 Muscle weakness (generalized): Secondary | ICD-10-CM

## 2016-10-14 DIAGNOSIS — R262 Difficulty in walking, not elsewhere classified: Secondary | ICD-10-CM

## 2016-10-14 NOTE — Therapy (Signed)
[facility] [place], [place], [address] Phone: [phone]   Fax:  [phone]  Physical Therapy Treatment  Patient Details  Name: Tony Hodges MRN: [id number] Date of Birth: 1950/01/19 Referring Provider: Dr Delilah Shan  Encounter Date: 10/14/2016      PT End of Session - 10/14/16 0930    Visit Number 8   Number of Visits 12   PT Start Time 0901   PT Stop Time 0958   PT Time Calculation (min) 57 min   Activity Tolerance Patient tolerated treatment well   Behavior During Therapy [facility] for tasks assessed/performed      Past Medical History:  Diagnosis Date  . Diabetes mellitus   . Erectile dysfunction   . Hyperlipidemia   . Hypertension     Past Surgical History:  Procedure Laterality Date  . UMBILICAL HERNIA REPAIR  2010    There were no vitals filed for this visit.      Subjective Assessment - 10/14/16 0904    Subjective Patient reported doing well overall with some minimal ongoing discomfort   Pertinent History MCL   How long can you sit comfortably? unlimited   How long can you stand comfortably? unlimited   How long can you walk comfortably? 15 minutes   Currently in Pain? Yes   Pain Score 2    Pain Location Knee   Pain Orientation Right   Pain Descriptors / Indicators Discomfort   Pain Type Acute pain   Pain Onset More than a month ago   Pain Frequency Intermittent   Aggravating Factors  increased activity   Pain Relieving Factors at rest            [facility] PT Assessment - 10/14/16 0001      ROM / Strength   AROM / PROM / Strength AROM;PROM     AROM   AROM Assessment Site Knee   Right/Left Knee Right   Right Knee Extension -5     PROM   PROM Assessment Site Knee   Right/Left Knee Right   Right Knee Extension -2                     [name] - 10/14/16 0001      Knee/Hip Exercises: Aerobic   Stationary Bike Level 4-6 x 15 minutes.     Knee/Hip Exercises: Machines for Strengthening   Cybex Knee Extension 10# x40 eccentric focus   Cybex Knee Flexion 40# x40 eccentic focus   Cybex Leg Press 3plt seat 8 2x20     Knee/Hip Exercises: Standing   Rocker Board 3 minutes     Knee/Hip Exercises: Supine   Bridges with Ball Squeeze Strengthening;Both;2 sets;10 reps  4#     Knee/Hip Exercises: Sidelying   Hip ABduction Strengthening;Right;2 sets;10 reps   Clams 2x10     Electrical Stimulation   Electrical Stimulation Location Right medial knee.   Water quality scientist Parameters 80-150hz  x71min   Electrical Stimulation Goals Pain     Vasopneumatic   Number Minutes Vasopneumatic  15 minutes   Vasopnuematic Location  Knee   Vasopneumatic Pressure Medium     Manual Therapy   Manual Therapy Passive ROM   Manual therapy comments patella mobs, PROM to left knee extension with overpressure                      PT Long Term Goals - 10/05/16 6861  PT LONG TERM GOAL #1   Title Pt will be independent in his HEP   Status Achieved     PT LONG TERM GOAL #2   Title Pt will be able to demonstrate a normal gait pattern with heel strike noted with no pain.    Period Weeks   Status On-going     PT LONG TERM GOAL #3   Title Pt will improve his R LE knee extension to </= 7 degrees in order to improve functional mobility and gait.    Time 6   Period Weeks   Status Achieved  AROM R knee extension 3 deg from full extension 10/05/2016     PT LONG TERM GOAL #4   Title Pt will improve his R LE knee strength to 5/5 to improve gait and functional mobility.    Time 6   Period Weeks   Status On-going               Plan - 10/14/16 0946    Clinical Impression Statement Patient tolerated treatment well today with minimal discomfort overall. Patient able to progress strengthening exercises with no difficulty or complaints. Patient improved with right knee ext today. Current  goals progressing with strength, ROM and pain limitations.    Rehab Potential Excellent   PT Frequency 2x / week   PT Duration 6 weeks   PT Next Visit Plan cont with POC and check for order for Iontophoresis.  Please begin when order is signed.   Consulted and Agree with Plan of Care Patient      Patient will benefit from skilled therapeutic intervention in order to improve the following deficits and impairments:  Abnormal gait, Pain, Decreased activity tolerance, Decreased strength, Difficulty walking, Impaired flexibility, Decreased range of motion  Visit Diagnosis: Acute pain of right knee  Difficulty in walking, not elsewhere classified  Stiffness of right knee, not elsewhere classified  Knee swelling  Muscle weakness (generalized)     Problem List Patient Active Problem List   Diagnosis Date Noted  . BPH (benign prostatic hyperplasia) 11/25/2015  . Sprain and strain of metatarsophalangeal joint 04/02/2015  . Erectile dysfunction   . Diabetes mellitus ([place])   . HLD (hyperlipidemia) 05/25/2012  . HTN (hypertension) 05/25/2012    DUNFORD, CHRISTINA P, PTA 10/14/2016, 10:03 AM  [facility] [place], [place], [address] Phone: [phone]   Fax:  [phone]  Name: Tony Hodges MRN: [id number] Date of Birth: [birth year]

## 2016-10-19 ENCOUNTER — Encounter: Payer: Self-pay | Admitting: Physical Therapy

## 2016-10-19 ENCOUNTER — Ambulatory Visit: Payer: 59 | Admitting: Physical Therapy

## 2016-10-19 DIAGNOSIS — M25469 Effusion, unspecified knee: Secondary | ICD-10-CM

## 2016-10-19 DIAGNOSIS — M25561 Pain in right knee: Secondary | ICD-10-CM

## 2016-10-19 DIAGNOSIS — R262 Difficulty in walking, not elsewhere classified: Secondary | ICD-10-CM

## 2016-10-19 DIAGNOSIS — M6281 Muscle weakness (generalized): Secondary | ICD-10-CM

## 2016-10-19 DIAGNOSIS — M25661 Stiffness of right knee, not elsewhere classified: Secondary | ICD-10-CM

## 2016-10-19 NOTE — Therapy (Signed)
[facility] [place], [place], [address] Phone: [phone]   Fax:  [phone]  Physical Therapy Treatment  Patient Details  Name: Tony Hodges MRN: [id number] Date of Birth: 08/28/1950 Referring Provider: Dr Delilah Shan  Encounter Date: 10/19/2016      PT End of Session - 10/19/16 0935    Visit Number 9   Number of Visits 12   PT Start Time 0900   PT Stop Time 0955   PT Time Calculation (min) 55 min   Activity Tolerance Patient tolerated treatment well   Behavior During Therapy [facility] for tasks assessed/performed      Past Medical History:  Diagnosis Date  . Diabetes mellitus   . Erectile dysfunction   . Hyperlipidemia   . Hypertension     Past Surgical History:  Procedure Laterality Date  . UMBILICAL HERNIA REPAIR  2010    There were no vitals filed for this visit.      Subjective Assessment - 10/19/16 0932    Subjective Pt still concerned about getting full knee extension. Minimal pain reported   Pertinent History MCL   How long can you sit comfortably? unlimited   How long can you stand comfortably? unlimited   How long can you walk comfortably? 35 minutes   Currently in Pain? Yes   Pain Score 2    Pain Location Knee   Pain Descriptors / Indicators Discomfort   Pain Type Surgical pain   Pain Onset More than a month ago   Pain Frequency Intermittent   Aggravating Factors  first thing in the morning, initial activities   Pain Relieving Factors resting                         [name] - 10/19/16 0001      Exercises   Exercises Knee/Hip     Knee/Hip Exercises: Stretches   Gastroc Stretch Right;3 reps;30 seconds     Knee/Hip Exercises: Aerobic   Stationary Bike Level 4-6 x 15 minutes.     Knee/Hip Exercises: Machines for Strengthening   Cybex Knee Extension 10# x40 eccentric focus   Cybex Knee Flexion 40# x40 eccentic focus   Cybex Leg Press 3 plates,  seat 7, 25 reps with bilateral LE's, 25 reps x 2 with R LE and 25 reps with calf raises     Knee/Hip Exercises: Standing   Rocker Board 3 minutes   Other Standing Knee Exercises forward lunges, terminal knee extension x 20 with green theraband     Knee/Hip Exercises: Sidelying   Hip ABduction Strengthening;Right;2 sets;10 reps     Modalities   Modalities Electrical Stimulation;Vasopneumatic     Electrical Stimulation   Electrical Stimulation Location Right medial knee.   Electrical Stimulation Action premod   Electrical Stimulation Parameters 80-150 Hz x 15 minutes   Electrical Stimulation Goals Pain     Vasopneumatic   Number Minutes Vasopneumatic  15 minutes   Vasopnuematic Location  Knee   Vasopneumatic Pressure Medium     Manual Therapy   Manual Therapy Passive ROM   Manual therapy comments patella mobs, PROM to left knee extension with overpressure    Passive ROM knee extension with overpressure                PT Education - 10/19/16 0935    Education provided Yes   Education Details instructed pt to increase his prone knee hangs to 3 times each day  Person(s) Educated Patient   Methods Explanation;Demonstration   Comprehension Verbalized understanding;Returned demonstration             PT Long Term Goals - 10/19/16 1235      PT LONG TERM GOAL #1   Title Pt will be independent in his HEP   Status Achieved     PT LONG TERM GOAL #2   Title Pt will be able to demonstrate a normal gait pattern with heel strike noted with no pain.    Status On-going     PT LONG TERM GOAL #3   Title Pt will improve his R LE knee extension to </= 7 degrees in order to improve functional mobility and gait.    Status Achieved     PT LONG TERM GOAL #4   Title Pt will improve his R LE knee strength to 5/5 to improve gait and functional mobility.    Status On-going               Plan - 10/19/16 0936    Clinical Impression Statement Patient tolerating  treatement well with incrased flexion, but still progressing toward 0 degrees of extension and LTG's. Pt participating in walking program at home and tolerating well with increased stiffness initially but reports his knee improves after about 10 minutes of walking. Pt instructed to increase his prone knee hangs and quad sets to improve extension. Continue skilled PT.    Rehab Potential Excellent   PT Frequency 2x / week   PT Duration 6 weeks   PT Next Visit Plan cont with POC and check for order for Iontophoresis.  Please begin when order is signed.   PT Home Exercise Plan prone knee hangs, hamstring stretches, hip flexor stretches, SLR, quad sets   Consulted and Agree with Plan of Care Patient      Patient will benefit from skilled therapeutic intervention in order to improve the following deficits and impairments:  Abnormal gait, Pain, Decreased activity tolerance, Decreased strength, Difficulty walking, Impaired flexibility, Decreased range of motion  Visit Diagnosis: Acute pain of right knee  Difficulty in walking, not elsewhere classified  Stiffness of right knee, not elsewhere classified  Knee swelling  Muscle weakness (generalized)     Problem List Patient Active Problem List   Diagnosis Date Noted  . BPH (benign prostatic hyperplasia) 11/25/2015  . Sprain and strain of metatarsophalangeal joint 04/02/2015  . Erectile dysfunction   . Diabetes mellitus ([place])   . HLD (hyperlipidemia) 05/25/2012  . HTN (hypertension) 05/25/2012    Oretha Caprice, MPT 10/19/2016, 12:38 PM  [facility] [address] [place], [place], [address] Phone: [phone]   Fax:  [phone]  Name: Tony Hodges MRN: [id number] Date of Birth: 26-Oct-1950

## 2016-10-21 ENCOUNTER — Ambulatory Visit: Payer: 59 | Admitting: *Deleted

## 2016-10-21 DIAGNOSIS — R262 Difficulty in walking, not elsewhere classified: Secondary | ICD-10-CM

## 2016-10-21 DIAGNOSIS — M6281 Muscle weakness (generalized): Secondary | ICD-10-CM

## 2016-10-21 DIAGNOSIS — M25469 Effusion, unspecified knee: Secondary | ICD-10-CM

## 2016-10-21 DIAGNOSIS — M25561 Pain in right knee: Secondary | ICD-10-CM

## 2016-10-21 DIAGNOSIS — M25661 Stiffness of right knee, not elsewhere classified: Secondary | ICD-10-CM

## 2016-10-21 NOTE — Therapy (Signed)
[facility] [place], [place], [address] Phone: [phone]   Fax:  [phone]  Physical Therapy Treatment  Patient Details  Name: FAOLAN SPRINGFIELD MRN: [id number] Date of Birth: Feb 12, 1950 Referring Provider: Dr Delilah Shan  Encounter Date: 10/21/2016      PT End of Session - 10/21/16 0917    Visit Number 10   Number of Visits 12   PT Start Time 0900   PT Stop Time 1000   PT Time Calculation (min) 60 min      Past Medical History:  Diagnosis Date  . Diabetes mellitus   . Erectile dysfunction   . Hyperlipidemia   . Hypertension     Past Surgical History:  Procedure Laterality Date  . UMBILICAL HERNIA REPAIR  2010    There were no vitals filed for this visit.      Subjective Assessment - 10/21/16 0915    Subjective Pt still concerned about getting full knee extension. Minimal pain reported   Pertinent History MCL   How long can you sit comfortably? unlimited   How long can you stand comfortably? unlimited   How long can you walk comfortably? 35 minutes   Currently in Pain? Yes   Pain Score 2    Pain Location Knee   Pain Orientation Right   Pain Descriptors / Indicators Discomfort                         [name] - 10/21/16 0001      Exercises   Exercises Knee/Hip     Knee/Hip Exercises: Stretches   Gastroc Stretch Right;3 reps;30 seconds     Knee/Hip Exercises: Aerobic   Stationary Bike Level 4-6 x 15 minutes.     Knee/Hip Exercises: Standing   Rocker Board 3 minutes     Modalities   Modalities Electrical Stimulation;Ultrasound;Vasopneumatic     Electrical Stimulation   Electrical Stimulation Location Right medial knee.   Electrical Stimulation Action premod   Electrical Stimulation Parameters 80-150hz  x15 mins   Electrical Stimulation Goals Pain     Ultrasound   Ultrasound Location RT MCL 1.3 w/cm2 x 10 mins 3.3 mhz in prone   Ultrasound Goals  Pain     Vasopneumatic   Number Minutes Vasopneumatic  15 minutes   Vasopnuematic Location  Knee   Vasopneumatic Pressure Medium   Vasopneumatic Temperature  34     Manual Therapy   Manual Therapy Soft tissue mobilization   Passive ROM knee extension in prone while performing STW/ IASTM  to MCL                     PT Long Term Goals - 10/19/16 1235      PT LONG TERM GOAL #1   Title Pt will be independent in his HEP   Status Achieved     PT LONG TERM GOAL #2   Title Pt will be able to demonstrate a normal gait pattern with heel strike noted with no pain.    Status On-going     PT LONG TERM GOAL #3   Title Pt will improve his R LE knee extension to </= 7 degrees in order to improve functional mobility and gait.    Status Achieved     PT LONG TERM GOAL #4   Title Pt will improve his R LE knee strength to 5/5 to improve gait and functional mobility.    Status On-going  Plan - 10/21/16 1032    Clinical Impression Statement Pt arrived today doing fairly well with decreased pain overall in RT knee, but still with 3-5 degree extension deficit. Rx focused on therex and US/STW to [facility] for increased extension ROM progression. Decreased tightness and soreness after Rx. ROM still -3 after RX   Clinical Presentation Stable   Clinical Decision Making Low   Rehab Potential Excellent   PT Frequency 2x / week   PT Duration 6 weeks   PT Next Visit Plan cont with POC and check for order for Iontophoresis.  Please begin when order is signed.   PT Home Exercise Plan prone knee hangs, hamstring stretches, hip flexor stretches, SLR, quad sets   Consulted and Agree with Plan of Care Patient      Patient will benefit from skilled therapeutic intervention in order to improve the following deficits and impairments:  Abnormal gait, Pain, Decreased activity tolerance, Decreased strength, Difficulty walking, Impaired flexibility, Decreased range of motion  Visit  Diagnosis: Acute pain of right knee  Difficulty in walking, not elsewhere classified  Stiffness of right knee, not elsewhere classified  Knee swelling  Muscle weakness (generalized)     Problem List Patient Active Problem List   Diagnosis Date Noted  . BPH (benign prostatic hyperplasia) 11/25/2015  . Sprain and strain of metatarsophalangeal joint 04/02/2015  . Erectile dysfunction   . Diabetes mellitus ([place])   . HLD (hyperlipidemia) 05/25/2012  . HTN (hypertension) 05/25/2012    Abdulah Iqbal,CHRIS, PTA 10/21/2016, 10:36 AM  [facility] [place], [place], [address] Phone: [phone]   Fax:  [phone]  Name: KREIG PARSON MRN: [id number] Date of Birth: 1950/05/24

## 2016-10-26 ENCOUNTER — Ambulatory Visit: Payer: 59 | Admitting: *Deleted

## 2016-10-26 DIAGNOSIS — M25561 Pain in right knee: Secondary | ICD-10-CM

## 2016-10-26 DIAGNOSIS — M6281 Muscle weakness (generalized): Secondary | ICD-10-CM

## 2016-10-26 DIAGNOSIS — M25469 Effusion, unspecified knee: Secondary | ICD-10-CM

## 2016-10-26 DIAGNOSIS — M25661 Stiffness of right knee, not elsewhere classified: Secondary | ICD-10-CM

## 2016-10-26 DIAGNOSIS — R262 Difficulty in walking, not elsewhere classified: Secondary | ICD-10-CM

## 2016-10-26 NOTE — Therapy (Signed)
[facility] [place], [place], [address] Phone: [phone]   Fax:  [phone]  Physical Therapy Treatment  Patient Details  Name: Tony Hodges MRN: [id number] Date of Birth: [birth year] Referring Provider: Dr Delilah Shan  Encounter Date: 10/26/2016      PT End of Session - 10/26/16 0831    Visit Number 11   Number of Visits 12   PT Start Time 0816   PT Stop Time 0909   PT Time Calculation (min) 53 min      Past Medical History:  Diagnosis Date  . Diabetes mellitus   . Erectile dysfunction   . Hyperlipidemia   . Hypertension     Past Surgical History:  Procedure Laterality Date  . UMBILICAL HERNIA REPAIR  2010    There were no vitals filed for this visit.      Subjective Assessment - 10/26/16 0829    Subjective Pt still concerned about getting full knee extension. Minimal pain reported. Did good after last Rx   Pertinent History MCL   How long can you sit comfortably? unlimited   How long can you stand comfortably? unlimited   How long can you walk comfortably? 35 minutes   Currently in Pain? Yes   Pain Score 2    Pain Orientation Right   Pain Descriptors / Indicators Discomfort   Pain Type Surgical pain   Pain Onset More than a month ago   Pain Frequency Intermittent                         [name] - 10/26/16 0001      Exercises   Exercises Knee/Hip     Knee/Hip Exercises: Stretches   Gastroc Stretch Right;3 reps;30 seconds     Knee/Hip Exercises: Aerobic   Stationary Bike Level 4-6 x 15 minutes.     Knee/Hip Exercises: Machines for Strengthening   Cybex Knee Extension 10# x40 eccentric focus     Knee/Hip Exercises: Standing   Rocker Board 3 minutes     Modalities   Modalities Electrical Stimulation;Ultrasound;Vasopneumatic     Electrical Stimulation   Electrical Stimulation Location Right medial knee. Premod 80-150hz  x 15 mins      Ultrasound   Ultrasound Goals Pain     Vasopneumatic   Number Minutes Vasopneumatic  15 minutes   Vasopnuematic Location  Knee   Vasopneumatic Pressure Medium   Vasopneumatic Temperature  34     Manual Therapy   Manual Therapy Soft tissue mobilization   Passive ROM knee extension in prone while performing STW/ IASTM  to MCL                     PT Long Term Goals - 10/19/16 1235      PT LONG TERM GOAL #1   Title Pt will be independent in his HEP   Status Achieved     PT LONG TERM GOAL #2   Title Pt will be able to demonstrate a normal gait pattern with heel strike noted with no pain.    Status On-going     PT LONG TERM GOAL #3   Title Pt will improve his R LE knee extension to </= 7 degrees in order to improve functional mobility and gait.    Status Achieved     PT LONG TERM GOAL #4   Title Pt will improve his R LE knee strength to 5/5 to improve gait  and functional mobility.    Status On-going               Plan - 10/26/16 0908    Clinical Impression Statement Pt arrived today doing good with minimal pain in RT knee medial aspect. He was able to play 9 holes of golf this weekend while wearing his brace and did well. He did well with therex and modalities today, but still with mild extension deficit.   PT Frequency 2x / week   PT Duration 6 weeks   PT Next Visit Plan cont with POC and check for order for Iontophoresis.  Please begin when order is signed.   PT Home Exercise Plan prone knee hangs, hamstring stretches, hip flexor stretches, SLR, quad sets   Consulted and Agree with Plan of Care Patient      Patient will benefit from skilled therapeutic intervention in order to improve the following deficits and impairments:  Abnormal gait, Pain, Decreased activity tolerance, Decreased strength, Difficulty walking, Impaired flexibility, Decreased range of motion  Visit Diagnosis: Acute pain of right knee  Difficulty in walking, not elsewhere  classified  Stiffness of right knee, not elsewhere classified  Knee swelling  Muscle weakness (generalized)     Problem List Patient Active Problem List   Diagnosis Date Noted  . BPH (benign prostatic hyperplasia) 11/25/2015  . Sprain and strain of metatarsophalangeal joint 04/02/2015  . Erectile dysfunction   . Diabetes mellitus ([place])   . HLD (hyperlipidemia) 05/25/2012  . HTN (hypertension) 05/25/2012    Kenzlee Fishburn,CHRIS, PTA 10/26/2016, 9:42 AM  [facility] [address] [place], [place], [address] Phone: [phone]   Fax:  [phone]  Name: Tony Hodges MRN: [id number] Date of Birth: 05-Oct-1950

## 2016-10-28 ENCOUNTER — Ambulatory Visit: Payer: 59 | Admitting: *Deleted

## 2016-10-28 DIAGNOSIS — M25661 Stiffness of right knee, not elsewhere classified: Secondary | ICD-10-CM

## 2016-10-28 DIAGNOSIS — M25561 Pain in right knee: Secondary | ICD-10-CM | POA: Diagnosis not present

## 2016-10-28 DIAGNOSIS — M25469 Effusion, unspecified knee: Secondary | ICD-10-CM

## 2016-10-28 DIAGNOSIS — R262 Difficulty in walking, not elsewhere classified: Secondary | ICD-10-CM

## 2016-10-28 DIAGNOSIS — M6281 Muscle weakness (generalized): Secondary | ICD-10-CM

## 2016-10-28 NOTE — Therapy (Signed)
[facility] [place], [place], [address] Phone: [phone]   Fax:  [phone]  Physical Therapy Treatment  Patient Details  Name: Tony Hodges MRN: [id number] Date of Birth: 05-20-1950 Referring Provider: Dr Delilah Shan  Encounter Date: 10/28/2016      PT End of Session - 10/28/16 0825    Visit Number 12   Number of Visits 12   PT Start Time 0816   PT Stop Time 0910   PT Time Calculation (min) 54 min      Past Medical History:  Diagnosis Date  . Diabetes mellitus   . Erectile dysfunction   . Hyperlipidemia   . Hypertension     Past Surgical History:  Procedure Laterality Date  . UMBILICAL HERNIA REPAIR  2010    There were no vitals filed for this visit.      Subjective Assessment - 10/28/16 0818    Subjective To MD tomorrow   Pertinent History MCL   How long can you sit comfortably? unlimited   How long can you stand comfortably? unlimited   How long can you walk comfortably? 35 minutes   Currently in Pain? Yes   Pain Score 2    Pain Location Knee   Pain Orientation Right   Pain Descriptors / Indicators Discomfort   Pain Type Surgical pain   Pain Onset More than a month ago   Pain Frequency Intermittent                         [name] - 10/28/16 0001      Exercises   Exercises Knee/Hip     Knee/Hip Exercises: Aerobic   Stationary Bike Level 4-6 x 15 minutes.     Knee/Hip Exercises: Machines for Strengthening   Cybex Knee Extension 10# x40 eccentric focus     Knee/Hip Exercises: Standing   Rocker Board 3 minutes     Modalities   Modalities Electrical Stimulation;Ultrasound;Vasopneumatic     Electrical Stimulation   Electrical Stimulation Location Right medial knee. Premod 80-150hz  x 15 mins   Electrical Stimulation Goals Pain     Vasopneumatic   Number Minutes Vasopneumatic  15 minutes   Vasopnuematic Location  Knee   Vasopneumatic  Pressure Medium   Vasopneumatic Temperature  34     Manual Therapy   Manual Therapy Soft tissue mobilization   Passive ROM knee extension in prone while performing STW/ IASTM  to MCL     LLLDS 2# x3 mins                PT Long Term Goals - 10/28/16 0835      PT LONG TERM GOAL #1   Title (P)  Pt will be independent in his HEP   Time (P)  6   Period (P)  Weeks   Status (P)  Achieved     PT LONG TERM GOAL #2   Title (P)  Pt will be able to demonstrate a normal gait pattern with heel strike noted with no pain.    Time (P)  6   Period (P)  Weeks   Status (P)  On-going     PT LONG TERM GOAL #3   Title (P)  Pt will improve his R LE knee extension to </= 7 degrees in order to improve functional mobility and gait.    Time (P)  6   Period (P)  Weeks   Status (P)  Achieved  PT LONG TERM GOAL #4   Title (P)  Pt will improve his R LE knee strength to 5/5 to improve gait and functional mobility.    Time (P)  6   Period (P)  Weeks   Status (P)  On-going               Plan - 10/28/16 9147    Clinical Impression Statement Pt arrived today doing fairly well. He feels he is doing much better overall , but still with some soreness along MCL and about -5 degrees from full extension. Pt was advised to try LLLDS with 2-3#s for extension stretching at home x 10 mins 2x daily.    Clinical Decision Making Low   Rehab Potential Excellent   PT Frequency 2x / week   PT Duration 6 weeks   PT Next Visit Plan Send MD note 10-28-16 . Await new MD order or DC   PT Home Exercise Plan prone knee hangs, hamstring stretches, hip flexor stretches, SLR, quad sets   Consulted and Agree with Plan of Care Patient      Patient will benefit from skilled therapeutic intervention in order to improve the following deficits and impairments:  Abnormal gait, Pain, Decreased activity tolerance, Decreased strength, Difficulty walking, Impaired flexibility, Decreased range of motion  Visit  Diagnosis: Difficulty in walking, not elsewhere classified  Stiffness of right knee, not elsewhere classified  Knee swelling  Muscle weakness (generalized)  Acute pain of right knee     Problem List Patient Active Problem List   Diagnosis Date Noted  . BPH (benign prostatic hyperplasia) 11/25/2015  . Sprain and strain of metatarsophalangeal joint 04/02/2015  . Erectile dysfunction   . Diabetes mellitus ([place])   . HLD (hyperlipidemia) 05/25/2012  . HTN (hypertension) 05/25/2012   [name] PTA [place], [place], MPT 10/28/2016, 12:17 PM  [facility] [address] [place], [place], [address] Phone: [phone]   Fax:  [phone]  Name: Tony Hodges MRN: [id number] Date of Birth: Dec 10, 1950

## 2016-11-02 ENCOUNTER — Ambulatory Visit: Payer: 59 | Admitting: Physical Therapy

## 2016-11-02 ENCOUNTER — Encounter: Payer: Self-pay | Admitting: Physical Therapy

## 2016-11-02 DIAGNOSIS — M25469 Effusion, unspecified knee: Secondary | ICD-10-CM

## 2016-11-02 DIAGNOSIS — R262 Difficulty in walking, not elsewhere classified: Secondary | ICD-10-CM

## 2016-11-02 DIAGNOSIS — M25561 Pain in right knee: Secondary | ICD-10-CM | POA: Diagnosis not present

## 2016-11-02 DIAGNOSIS — M6281 Muscle weakness (generalized): Secondary | ICD-10-CM

## 2016-11-02 DIAGNOSIS — M25661 Stiffness of right knee, not elsewhere classified: Secondary | ICD-10-CM

## 2016-11-02 NOTE — Therapy (Signed)
[facility] [place], [place], [address] Phone: [phone]   Fax:  [phone]  Physical Therapy Treatment  Patient Details  Name: Tony Hodges MRN: [id number] Date of Birth: 1950-12-01 Referring Provider: Dr Delilah Shan  Encounter Date: 11/02/2016      PT End of Session - 11/02/16 0921    Visit Number 13   Number of Visits 16   Authorization Type Pt brought script from MD for 4 additional visits   PT Start Time 0904   PT Stop Time 1000   PT Time Calculation (min) 56 min   Activity Tolerance Patient tolerated treatment well   Behavior During Therapy [facility] for tasks assessed/performed      Past Medical History:  Diagnosis Date  . Diabetes mellitus   . Erectile dysfunction   . Hyperlipidemia   . Hypertension     Past Surgical History:  Procedure Laterality Date  . UMBILICAL HERNIA REPAIR  2010    There were no vitals filed for this visit.      Subjective Assessment - 11/02/16 0918    Subjective Pt reported MD was pleased with progress and wishes to spread his visits out to contniue his progress with therapy.    Pertinent History MCL   How long can you sit comfortably? unlimited   How long can you stand comfortably? unlimited   How long can you walk comfortably? 35 minutes   Currently in Pain? No/denies                                      PT Long Term Goals - 11/02/16 0950      PT LONG TERM GOAL #1   Title Pt will be independent in his HEP   Time 6   Status Achieved     PT LONG TERM GOAL #2   Title Pt will be able to demonstrate a normal gait pattern with heel strike noted with no pain.    Time 6   Period Weeks   Status On-going     PT LONG TERM GOAL #3   Title Pt will improve his R LE knee extension to </= 7 degrees in order to improve functional mobility and gait.    Status Achieved     PT LONG TERM GOAL #4   Title Pt will improve his R LE knee strength to 5/5  to improve gait and functional mobility.    Time 6   Period Weeks   Status On-going               Plan - 11/02/16 8841    Clinical Impression Statement Pt arriving today after visiting with his MD and arrived with script for 4 additional visits. pt reporting no pain at rest and is consistent about doing his HEP and walking daily. Pt is making progress with R knee extension and tolerating prone knee hangs.    Clinical Presentation Stable   Rehab Potential Excellent   PT Frequency 2x / week   PT Duration 6 weeks   PT Next Visit Plan received order for 4 additional visits   PT Home Exercise Plan prone knee hangs, hamstring stretches, hip flexor stretches, SLR, quad sets   Consulted and Agree with Plan of Care Patient      Patient will benefit from skilled therapeutic intervention in order to improve the following deficits and impairments:  Abnormal gait, Pain,  Decreased activity tolerance, Decreased strength, Difficulty walking, Impaired flexibility, Decreased range of motion  Visit Diagnosis: Difficulty in walking, not elsewhere classified  Stiffness of right knee, not elsewhere classified  Knee swelling  Muscle weakness (generalized)  Acute pain of right knee     Problem List Patient Active Problem List   Diagnosis Date Noted  . BPH (benign prostatic hyperplasia) 11/25/2015  . Sprain and strain of metatarsophalangeal joint 04/02/2015  . Erectile dysfunction   . Diabetes mellitus ([place])   . HLD (hyperlipidemia) 05/25/2012  . HTN (hypertension) 05/25/2012    Oretha Caprice, PT 11/02/2016, 9:58 AM  [facility] [address] [place], [place], [address] Phone: [phone]   Fax:  [phone]  Name: Tony Hodges MRN: [id number] Date of Birth: Dec 27, 1950

## 2016-11-04 ENCOUNTER — Encounter: Payer: 59 | Admitting: *Deleted

## 2016-11-04 ENCOUNTER — Other Ambulatory Visit: Payer: Self-pay | Admitting: *Deleted

## 2016-11-04 MED ORDER — ATORVASTATIN CALCIUM 20 MG PO TABS: 10.0000 mg | ORAL_TABLET | Freq: Every day | ORAL | 0 refills | Status: DC

## 2016-11-09 ENCOUNTER — Ambulatory Visit: Payer: 59 | Admitting: *Deleted

## 2016-11-09 DIAGNOSIS — R262 Difficulty in walking, not elsewhere classified: Secondary | ICD-10-CM

## 2016-11-09 DIAGNOSIS — M25561 Pain in right knee: Secondary | ICD-10-CM

## 2016-11-09 DIAGNOSIS — M6281 Muscle weakness (generalized): Secondary | ICD-10-CM

## 2016-11-09 DIAGNOSIS — M25469 Effusion, unspecified knee: Secondary | ICD-10-CM

## 2016-11-09 DIAGNOSIS — M25661 Stiffness of right knee, not elsewhere classified: Secondary | ICD-10-CM

## 2016-11-09 NOTE — Therapy (Signed)
[facility] [place], [place], [address] Phone: [phone]   Fax:  [phone]  Physical Therapy Treatment  Patient Details  Name: Tony Hodges MRN: [id number] Date of Birth: 1950-06-17 Referring Provider: Dr Delilah Shan  Encounter Date: 11/09/2016      PT End of Session - 11/09/16 1212    Visit Number 14   Number of Visits 16   Authorization Type Pt brought script from MD for 4 additional visits   PT Start Time 1115   PT Stop Time 1214   PT Time Calculation (min) 59 min      Past Medical History:  Diagnosis Date  . Diabetes mellitus   . Erectile dysfunction   . Hyperlipidemia   . Hypertension     Past Surgical History:  Procedure Laterality Date  . UMBILICAL HERNIA REPAIR  2010    There were no vitals filed for this visit.      Subjective Assessment - 11/09/16 1131    Subjective Pt reported MD was pleased with progress and wishes to spread his visits out to contniue his progress with therapy.    Pertinent History MCL   How long can you sit comfortably? unlimited   How long can you stand comfortably? unlimited   How long can you walk comfortably? 35 minutes                         [name] - 11/09/16 0001      Knee/Hip Exercises: Aerobic   Stationary Bike Level 4-6 x 15 minutes.     Knee/Hip Exercises: Machines for Strengthening   Cybex Knee Extension 10# x40 eccentric focus     Knee/Hip Exercises: Standing   Rocker Board 3 minutes     Modalities   Modalities Electrical Stimulation;Ultrasound;Vasopneumatic     Electrical Stimulation   Electrical Stimulation Location Right medial knee. Premod 80-150hz  x 15 mins   Electrical Stimulation Goals Pain     Vasopneumatic   Number Minutes Vasopneumatic  15 minutes   Vasopnuematic Location  Knee   Vasopneumatic Pressure Medium   Vasopneumatic Temperature  34     Manual Therapy   Manual Therapy Soft tissue  mobilization   Passive ROM knee extension in prone while performing STW/ IASTM  to MCL                     PT Long Term Goals - 11/02/16 0950      PT LONG TERM GOAL #1   Title Pt will be independent in his HEP   Time 6   Status Achieved     PT LONG TERM GOAL #2   Title Pt will be able to demonstrate a normal gait pattern with heel strike noted with no pain.    Time 6   Period Weeks   Status On-going     PT LONG TERM GOAL #3   Title Pt will improve his R LE knee extension to </= 7 degrees in order to improve functional mobility and gait.    Status Achieved     PT LONG TERM GOAL #4   Title Pt will improve his R LE knee strength to 5/5 to improve gait and functional mobility.    Time 6   Period Weeks   Status On-going               Plan - 11/09/16 1212    Clinical Impression Statement Pt  arrived today doing fairly well with low pain levels RT MCL. He was able to complete all therex for strengthing and has decreased soreness along MCL with STW. Normal modality response. -4 degrees extension ROM   Clinical Presentation Stable   Clinical Decision Making Low   Rehab Potential Excellent   PT Frequency 2x / week   PT Duration 6 weeks   PT Next Visit Plan received order for 4 additional visits   PT Home Exercise Plan prone knee hangs, hamstring stretches, hip flexor stretches, SLR, quad sets   Consulted and Agree with Plan of Care Patient      Patient will benefit from skilled therapeutic intervention in order to improve the following deficits and impairments:     Visit Diagnosis: Difficulty in walking, not elsewhere classified  Stiffness of right knee, not elsewhere classified  Knee swelling  Muscle weakness (generalized)  Acute pain of right knee     Problem List Patient Active Problem List   Diagnosis Date Noted  . BPH (benign prostatic hyperplasia) 11/25/2015  . Sprain and strain of metatarsophalangeal joint 04/02/2015  . Erectile  dysfunction   . Diabetes mellitus ([place])   . HLD (hyperlipidemia) 05/25/2012  . HTN (hypertension) 05/25/2012    Melayah Skorupski,CHRIS , PTA 11/09/2016, 12:17 PM  [facility] [place], [place], [address] Phone: [phone]   Fax:  [phone]  Name: Tony Hodges MRN: [id number] Date of Birth: [birth year]

## 2016-11-11 ENCOUNTER — Other Ambulatory Visit: Payer: Self-pay | Admitting: Family Medicine

## 2016-11-16 ENCOUNTER — Ambulatory Visit: Payer: 59 | Attending: Sports Medicine | Admitting: *Deleted

## 2016-11-16 DIAGNOSIS — M25661 Stiffness of right knee, not elsewhere classified: Secondary | ICD-10-CM

## 2016-11-16 DIAGNOSIS — M6281 Muscle weakness (generalized): Secondary | ICD-10-CM | POA: Diagnosis present

## 2016-11-16 DIAGNOSIS — M25469 Effusion, unspecified knee: Secondary | ICD-10-CM | POA: Diagnosis present

## 2016-11-16 DIAGNOSIS — M25561 Pain in right knee: Secondary | ICD-10-CM | POA: Insufficient documentation

## 2016-11-16 DIAGNOSIS — R262 Difficulty in walking, not elsewhere classified: Secondary | ICD-10-CM | POA: Insufficient documentation

## 2016-11-16 NOTE — Therapy (Signed)
[facility] [place], [place], [address] Phone: [phone]   Fax:  [phone]  Physical Therapy Treatment  Patient Details  Name: Tony Hodges MRN: [id number] Date of Birth: 03-24-1950 Referring Provider: Dr Delilah Shan   Encounter Date: 11/16/2016  PT End of Session - 11/16/16 0905    Visit Number  15    Number of Visits  16    PT Start Time  0900    PT Stop Time  0950    PT Time Calculation (min)  50 min       Past Medical History:  Diagnosis Date  . Diabetes mellitus   . Erectile dysfunction   . Hyperlipidemia   . Hypertension     Past Surgical History:  Procedure Laterality Date  . UMBILICAL HERNIA REPAIR  2010    There were no vitals filed for this visit.  Subjective Assessment - 11/16/16 0903    Subjective  Pt reported MD was pleased with progress and wishes to spread his visits out to contniue his progress with therapy.  1 visit left after today    Pertinent History  MCL    How long can you sit comfortably?  unlimited    How long can you stand comfortably?  unlimited    How long can you walk comfortably?  35 minutes    Currently in Pain?  No/denies    Pain Location  Knee    Pain Orientation  Right    Pain Descriptors / Indicators  Discomfort    Pain Onset  More than a month ago    Pain Frequency  Intermittent                      [name] - 11/16/16 0001      Knee/Hip Exercises: Standing   Rocker Board  3 minutes      Modalities   Modalities  Electrical Stimulation;Ultrasound;Vasopneumatic      Electrical Stimulation   Electrical Stimulation Location  Right medial knee. Premod 80-150hz  x 15 mins    Electrical Stimulation Goals  Pain      Vasopneumatic   Number Minutes Vasopneumatic   15 minutes    Vasopnuematic Location   Knee    Vasopneumatic Pressure  Medium    Vasopneumatic Temperature   34      Manual Therapy   Manual Therapy  Soft tissue  mobilization    Passive ROM  knee extension in prone while performing STW/ IASTM  to MCL                  PT Long Term Goals - 11/02/16 0950      PT LONG TERM GOAL #1   Title  Pt will be independent in his HEP    Time  6    Status  Achieved      PT LONG TERM GOAL #2   Title  Pt will be able to demonstrate a normal gait pattern with heel strike noted with no pain.     Time  6    Period  Weeks    Status  On-going      PT LONG TERM GOAL #3   Title  Pt will improve his R LE knee extension to </= 7 degrees in order to improve functional mobility and gait.     Status  Achieved      PT LONG TERM GOAL #4   Title  Pt will improve  his R LE knee strength to 5/5 to improve gait and functional mobility.     Time  6    Period  Weeks    Status  On-going            Plan - 11/16/16 0915    Clinical Impression Statement  Pt arrived today doing well with some soreness RT knee MCL area, but overall 90% better. He did well with Rx and knee extension is very close to o degrees. -    Clinical Presentation  Stable    Rehab Potential  Excellent    PT Frequency  2x / week    PT Duration  6 weeks    PT Next Visit Plan   DC after next visit    PT Home Exercise Plan  prone knee hangs, hamstring stretches, hip flexor stretches, SLR, quad sets       Patient will benefit from skilled therapeutic intervention in order to improve the following deficits and impairments:  Abnormal gait, Pain, Decreased activity tolerance, Decreased strength, Difficulty walking, Impaired flexibility, Decreased range of motion  Visit Diagnosis: Difficulty in walking, not elsewhere classified  Stiffness of right knee, not elsewhere classified  Knee swelling  Muscle weakness (generalized)  Acute pain of right knee     Problem List Patient Active Problem List   Diagnosis Date Noted  . BPH (benign prostatic hyperplasia) 11/25/2015  . Sprain and strain of metatarsophalangeal joint 04/02/2015  .  Erectile dysfunction   . Diabetes mellitus ([place])   . HLD (hyperlipidemia) 05/25/2012  . HTN (hypertension) 05/25/2012    RAMSEUR,CHRIS, PTA 11/16/2016, 10:40 AM  [facility] [place], [place], [address] Phone: [phone]   Fax:  [phone]  Name: Tony Hodges MRN: [id number] Date of Birth: 23-Feb-1950

## 2016-11-23 ENCOUNTER — Ambulatory Visit: Payer: 59 | Admitting: *Deleted

## 2016-11-23 DIAGNOSIS — M25561 Pain in right knee: Secondary | ICD-10-CM

## 2016-11-23 DIAGNOSIS — M25469 Effusion, unspecified knee: Secondary | ICD-10-CM

## 2016-11-23 DIAGNOSIS — M6281 Muscle weakness (generalized): Secondary | ICD-10-CM

## 2016-11-23 DIAGNOSIS — R262 Difficulty in walking, not elsewhere classified: Secondary | ICD-10-CM | POA: Diagnosis not present

## 2016-11-23 DIAGNOSIS — M25661 Stiffness of right knee, not elsewhere classified: Secondary | ICD-10-CM

## 2016-11-23 NOTE — Therapy (Addendum)
[facility] [place], [place], [address] Phone: [phone]   Fax:  [phone]  Physical Therapy Treatment  Patient Details  Name: Tony Hodges MRN: [id number] Date of Birth: 21-Jul-1950 Referring Provider: Dr Delilah Shan   Encounter Date: 11/23/2016  PT End of Session - 11/23/16 0923    Visit Number  16    Number of Visits  16    PT Start Time  0908    PT Stop Time  1004    PT Time Calculation (min)  56 min       Past Medical History:  Diagnosis Date  . Diabetes mellitus   . Erectile dysfunction   . Hyperlipidemia   . Hypertension     Past Surgical History:  Procedure Laterality Date  . UMBILICAL HERNIA REPAIR  2010    There were no vitals filed for this visit.  Subjective Assessment - 11/23/16 0919    Subjective  Doing good. DC today. To MD 12-10-16    Pertinent History  MCL    How long can you sit comfortably?  unlimited    How long can you stand comfortably?  unlimited    How long can you walk comfortably?  35 minutes    Currently in Pain?  No/denies                      [facility] Adult PT Treatment/Exercise - 11/23/16 0001      Knee/Hip Exercises: Aerobic   Stationary Bike  Level 4-6 x 15 minutes.      Knee/Hip Exercises: Machines for Strengthening   Cybex Knee Extension  10# x40 eccentric focus      Knee/Hip Exercises: Standing   Rocker Board  3 minutes      Modalities   Modalities  Electrical Stimulation;Ultrasound;Vasopneumatic      Electrical Stimulation   Electrical Stimulation Location  Right medial knee. Premod 80-'150hz'  x 15 mins    Electrical Stimulation Goals  Pain      Vasopneumatic   Number Minutes Vasopneumatic   15 minutes    Vasopnuematic Location   Knee    Vasopneumatic Pressure  Medium    Vasopneumatic Temperature   34      Manual Therapy   Manual Therapy  Soft tissue mobilization    Passive ROM  knee extension in prone while performing STW/ IASTM  to MCL                   PT Long Term Goals - 11/23/16 0920      PT LONG TERM GOAL #1   Title  Pt will be independent in his HEP    Time  6    Period  Weeks    Status  Achieved      PT LONG TERM GOAL #2   Title  Pt will be able to demonstrate a normal gait pattern with heel strike noted with no pain.     Time  6    Period  Weeks    Status  On-going      PT LONG TERM GOAL #3   Title  Pt will improve his R LE knee extension to </= 7 degrees in order to improve functional mobility and gait.     Time  6    Period  Weeks    Status  Achieved      PT LONG TERM GOAL #4   Title  Pt will improve his R LE knee  strength to 5/5 to improve gait and functional mobility.     Time  6    Period  Weeks    Status  Achieved            Plan - 11/23/16 4196    Clinical Impression Statement  Pt arrived today with mainly complaints of RT knee stiffness, but no pain. He did well with Rx and all LTGs have been met. Rx today focused on Strength and Entension ROM. Extension ROM to -3 degrees today    Rehab Potential  Excellent    PT Frequency  2x / week    PT Duration  6 weeks    PT Next Visit Plan  DC to [place]  prone knee hangs, hamstring stretches, hip flexor stretches, SLR, quad sets       Patient will benefit from skilled therapeutic intervention in order to improve the following deficits and impairments:  Abnormal gait, Pain, Decreased activity tolerance, Decreased strength, Difficulty walking, Impaired flexibility, Decreased range of motion  Visit Diagnosis: Difficulty in walking, not elsewhere classified  Stiffness of right knee, not elsewhere classified  Knee swelling  Muscle weakness (generalized)  Acute pain of right knee     Problem List Patient Active Problem List   Diagnosis Date Noted  . BPH (benign prostatic hyperplasia) 11/25/2015  . Sprain and strain of metatarsophalangeal joint 04/02/2015  . Erectile dysfunction   . Diabetes mellitus  ([place])   . HLD (hyperlipidemia) 05/25/2012  . HTN (hypertension) 05/25/2012    RAMSEUR,CHRIS, PTA 11/23/2016, 10:42 AM  [facility] [place], [place], [address] Phone: [phone]   Fax:  [phone]  Name: Tony Hodges MRN: [id number] Date of Birth: Sep 29, 1950  PHYSICAL THERAPY DISCHARGE SUMMARY  Visits from Start of Care: 16.  Current functional level related to goals / functional outcomes: See above.   Remaining deficits: Goal #2 unmet.   Education / Equipment: HEP. Plan: Patient agrees to discharge.  Patient goals were partially met. Patient is being discharged due to being pleased with the current functional level.  ?????         Mali Applegate MPT

## 2016-12-17 ENCOUNTER — Ambulatory Visit: Payer: Medicare Other | Admitting: Family Medicine

## 2016-12-21 ENCOUNTER — Ambulatory Visit: Payer: 59 | Admitting: Family Medicine

## 2016-12-23 ENCOUNTER — Other Ambulatory Visit: Payer: Self-pay | Admitting: Family Medicine

## 2016-12-27 ENCOUNTER — Encounter: Payer: Self-pay | Admitting: Family Medicine

## 2016-12-27 ENCOUNTER — Ambulatory Visit: Payer: 59 | Admitting: Family Medicine

## 2016-12-27 VITALS — BP 127/71 | HR 70 | Temp 98.5°F | Ht 71.25 in | Wt 190.4 lb

## 2016-12-27 DIAGNOSIS — I10 Essential (primary) hypertension: Secondary | ICD-10-CM | POA: Diagnosis not present

## 2016-12-27 DIAGNOSIS — E119 Type 2 diabetes mellitus without complications: Secondary | ICD-10-CM | POA: Diagnosis not present

## 2016-12-27 DIAGNOSIS — Z23 Encounter for immunization: Secondary | ICD-10-CM | POA: Diagnosis not present

## 2016-12-27 DIAGNOSIS — E7800 Pure hypercholesterolemia, unspecified: Secondary | ICD-10-CM

## 2016-12-27 DIAGNOSIS — E0800 Diabetes mellitus due to underlying condition with hyperosmolarity without nonketotic hyperglycemic-hyperosmolar coma (NKHHC): Secondary | ICD-10-CM

## 2016-12-27 LAB — BAYER DCA HB A1C WAIVED: HB A1C (BAYER DCA - WAIVED): 6.8 % (ref <7.0)

## 2016-12-27 MED ORDER — AMLODIPINE BESY-BENAZEPRIL HCL 5-10 MG PO CAPS: ORAL_CAPSULE | ORAL | 3 refills | Status: DC

## 2016-12-27 MED ORDER — ATORVASTATIN CALCIUM 20 MG PO TABS: 10.0000 mg | ORAL_TABLET | Freq: Every day | ORAL | 3 refills | Status: DC

## 2016-12-27 MED ORDER — SITAGLIPTIN PHOS-METFORMIN HCL 50-1000 MG PO TABS: 1.0000 | ORAL_TABLET | Freq: Two times a day (BID) | ORAL | 3 refills | Status: DC

## 2016-12-27 NOTE — Progress Notes (Signed)
HPI  Patient presents today to follow-up for chronic medical conditions.  Hyperlipidemia Good medication compliance with statin, tolerating well. Fasting today.  Diabetes Kombiglyze is not covered by his new insurance. Fasting blood sugar in the 140s. Also taking Actos without a problem. Watching his diet closely.  Hypertension Good medication compliance, not checking at home. No headaches or chest pain   PMH: Smoking status noted ROS: Per HPI  Objective: BP 127/71   Pulse 70   Temp 98.5 F (36.9 C) (Oral)   Ht 5' 11.25" (1.81 m)   Wt 190 lb 6.4 oz (86.4 kg)   BMI 26.37 kg/m  Gen: NAD, alert, cooperative with exam HEENT: NCAT CV: RRR, good S1/S2, no murmur Resp: CTABL, no wheezes, non-labored Abd: SNTND, BS present, no guarding or organomegaly Ext: No edema, warm Neuro: Alert and oriented, No gross deficits  Assessment and plan:  #Type 2 diabetes Clinically well controlled, A1c pending Changing Kombiglyze to Janumet.  (We will send this when we know if it is covered or not) Continue Actos, discussed issues around heart failure  #Hypertension Well-controlled No changes Labs  #Hypercholesterolemia Continue statin, repeat labs today   Orders Placed This Encounter  Procedures  . Bayer DCA Hb A1c Waived  . CMP14+EGFR  . CBC with Differential/Platelet  . Lipid panel    Meds ordered this encounter  Medications  . amLODipine-benazepril (LOTREL) 5-10 MG capsule    Sig: TAKE 1 CAPSULE BY MOUTH EVERY DAY    Dispense:  90 capsule    Refill:  3  . atorvastatin (LIPITOR) 20 MG tablet    Sig: Take 0.5 tablets (10 mg total) by mouth at bedtime.    Dispense:  90 tablet    Refill:  3  . sitaGLIPtin-metformin (JANUMET) 50-1000 MG tablet    Sig: Take 1 tablet by mouth 2 (two) times daily with a meal.    Dispense:  180 tablet    Refill:  [place], MD [facility] 12/27/2016, 3:18 PM

## 2016-12-27 NOTE — Patient Instructions (Signed)
Great to see you!  Come back in 3 months unless you need us sooner.    

## 2016-12-28 LAB — CBC WITH DIFFERENTIAL/PLATELET
BASOS ABS: 0 10*3/uL (ref 0.0–0.2)
BASOS: 1 %
EOS (ABSOLUTE): 0 10*3/uL (ref 0.0–0.4)
Eos: 1 %
Hematocrit: 41.5 % (ref 37.5–51.0)
Hemoglobin: 13.5 g/dL (ref 13.0–17.7)
IMMATURE GRANULOCYTES: 0 %
Immature Grans (Abs): 0 10*3/uL (ref 0.0–0.1)
Lymphocytes Absolute: 1.4 10*3/uL (ref 0.7–3.1)
Lymphs: 24 %
MCH: 29.3 pg (ref 26.6–33.0)
MCHC: 32.5 g/dL (ref 31.5–35.7)
MCV: 90 fL (ref 79–97)
MONOS ABS: 0.5 10*3/uL (ref 0.1–0.9)
Monocytes: 8 %
NEUTROS PCT: 66 %
Neutrophils Absolute: 4 10*3/uL (ref 1.4–7.0)
PLATELETS: 234 10*3/uL (ref 150–379)
RBC: 4.61 x10E6/uL (ref 4.14–5.80)
RDW: 13.2 % (ref 12.3–15.4)
WBC: 5.9 10*3/uL (ref 3.4–10.8)

## 2016-12-28 LAB — CMP14+EGFR
A/G RATIO: 2 (ref 1.2–2.2)
ALK PHOS: 58 IU/L (ref 39–117)
ALT: 16 IU/L (ref 0–44)
AST: 19 IU/L (ref 0–40)
Albumin: 4.7 g/dL (ref 3.6–4.8)
BILIRUBIN TOTAL: 0.3 mg/dL (ref 0.0–1.2)
BUN/Creatinine Ratio: 17 (ref 10–24)
BUN: 17 mg/dL (ref 8–27)
CHLORIDE: 102 mmol/L (ref 96–106)
CO2: 25 mmol/L (ref 20–29)
Calcium: 9.3 mg/dL (ref 8.6–10.2)
Creatinine, Ser: 1.01 mg/dL (ref 0.76–1.27)
GFR calc Af Amer: 90 mL/min/{1.73_m2} (ref >59)
GFR calc non Af Amer: 78 mL/min/{1.73_m2} (ref >59)
GLOBULIN, TOTAL: 2.3 g/dL (ref 1.5–4.5)
Glucose: 119 mg/dL — ABNORMAL HIGH (ref 65–99)
POTASSIUM: 4.3 mmol/L (ref 3.5–5.2)
SODIUM: 141 mmol/L (ref 134–144)
Total Protein: 7 g/dL (ref 6.0–8.5)

## 2016-12-28 LAB — LIPID PANEL
CHOL/HDL RATIO: 2.5 ratio (ref 0.0–5.0)
CHOLESTEROL TOTAL: 132 mg/dL (ref 100–199)
HDL: 53 mg/dL (ref >39)
LDL CALC: 66 mg/dL (ref 0–99)
Triglycerides: 66 mg/dL (ref 0–149)
VLDL CHOLESTEROL CAL: 13 mg/dL (ref 5–40)

## 2017-01-06 ENCOUNTER — Telehealth: Payer: Self-pay

## 2017-01-06 MED ORDER — ATORVASTATIN CALCIUM 20 MG PO TABS: 10.0000 mg | ORAL_TABLET | Freq: Every day | ORAL | 3 refills | Status: DC

## 2017-01-06 MED ORDER — AMLODIPINE BESY-BENAZEPRIL HCL 5-10 MG PO CAPS: ORAL_CAPSULE | ORAL | 3 refills | Status: DC

## 2017-01-06 MED ORDER — SITAGLIPTIN PHOS-METFORMIN HCL 50-1000 MG PO TABS: 1.0000 | ORAL_TABLET | Freq: Two times a day (BID) | ORAL | 3 refills | Status: DC

## 2017-01-06 NOTE — Telephone Encounter (Signed)
Needed rx's sent to the mail service not [facility]- re sent to correct pharmacy.

## 2017-01-12 DIAGNOSIS — E119 Type 2 diabetes mellitus without complications: Secondary | ICD-10-CM | POA: Diagnosis not present

## 2017-01-12 LAB — HM DIABETES EYE EXAM

## 2017-02-07 ENCOUNTER — Other Ambulatory Visit: Payer: Self-pay | Admitting: Family Medicine

## 2017-02-13 ENCOUNTER — Other Ambulatory Visit: Payer: Self-pay | Admitting: Family Medicine

## 2017-02-17 ENCOUNTER — Telehealth: Payer: Self-pay | Admitting: *Deleted

## 2017-02-17 MED ORDER — PIOGLITAZONE HCL 30 MG PO TABS: 30.0000 mg | ORAL_TABLET | Freq: Every day | ORAL | 0 refills | Status: DC

## 2017-02-17 NOTE — Telephone Encounter (Signed)
done

## 2017-03-30 ENCOUNTER — Encounter: Payer: Self-pay | Admitting: Family Medicine

## 2017-03-30 ENCOUNTER — Ambulatory Visit (INDEPENDENT_AMBULATORY_CARE_PROVIDER_SITE_OTHER): Payer: Medicare Other | Admitting: Family Medicine

## 2017-03-30 VITALS — BP 137/77 | HR 66 | Temp 97.2°F | Ht 71.25 in | Wt 189.4 lb

## 2017-03-30 DIAGNOSIS — E0800 Diabetes mellitus due to underlying condition with hyperosmolarity without nonketotic hyperglycemic-hyperosmolar coma (NKHHC): Secondary | ICD-10-CM

## 2017-03-30 LAB — BAYER DCA HB A1C WAIVED: HB A1C: 6.7 % (ref <7.0)

## 2017-03-30 MED ORDER — PIOGLITAZONE HCL 30 MG PO TABS: 30.0000 mg | ORAL_TABLET | Freq: Every day | ORAL | 3 refills | Status: DC

## 2017-03-30 NOTE — Patient Instructions (Signed)
Great to see you!   

## 2017-03-30 NOTE — Progress Notes (Signed)
   HPI  Patient presents today for follow-up chronic medical conditions.  Type 2 diabetes Typical fasting blood sugars 120s and 30s. No hypoglycemia Good medication compliance, understands risk with pioglitazone, needs refill.  No leg swelling.  PMH: Smoking status noted ROS: Per HPI  Objective: BP 137/77   Pulse 66   Temp (!) 97.2 F (36.2 C) (Oral)   Ht 5' 11.25" (1.81 m)   Wt 189 lb 6.4 oz (85.9 kg)   BMI 26.23 kg/m  Gen: NAD, alert, cooperative with exam HEENT: NCAT CV: RRR, good S1/S2, no murmur Resp: CTABL, no wheezes, non-labored Ext: No edema, warm Neuro: Alert and oriented, No gross deficits  Diabetic Foot Exam - Simple   Simple Foot Form Diabetic Foot exam was performed with the following findings:  Yes 03/30/2017  8:24 AM  Visual Inspection No deformities, no ulcerations, no other skin breakdown bilaterally:  Yes Sensation Testing Intact to touch and monofilament testing bilaterally:  Yes Pulse Check Posterior Tibialis and Dorsalis pulse intact bilaterally:  Yes Comments      Assessment and plan:  #Type 2 diabetes I expect good control, A1c previously well controlled, we have changed him from [place] to [place]. Well-controlled fasting blood sugars Good patient compliance Follow-up 3 months    Orders Placed This Encounter  Procedures  . Bayer DCA Hb A1c Waived  . Microalbumin / creatinine urine ratio    Meds ordered this encounter  Medications  . pioglitazone (ACTOS) 30 MG tablet    Sig: Take 1 tablet (30 mg total) by mouth daily.    Dispense:  90 tablet    Refill:  [place], MD [facility] 03/30/2017, 8:25 AM

## 2017-03-31 LAB — MICROALBUMIN / CREATININE URINE RATIO
Creatinine, Urine: 111.2 mg/dL
Microalb/Creat Ratio: <2.7 mg/g creat (ref 0.0–30.0)
Microalbumin, Urine: <3 ug/mL

## 2017-06-30 ENCOUNTER — Encounter: Payer: Self-pay | Admitting: Family Medicine

## 2017-06-30 ENCOUNTER — Ambulatory Visit (INDEPENDENT_AMBULATORY_CARE_PROVIDER_SITE_OTHER): Payer: Medicare Other | Admitting: Family Medicine

## 2017-06-30 VITALS — BP 135/77 | HR 64 | Temp 97.2°F | Ht 71.25 in | Wt 187.4 lb

## 2017-06-30 DIAGNOSIS — E0800 Diabetes mellitus due to underlying condition with hyperosmolarity without nonketotic hyperglycemic-hyperosmolar coma (NKHHC): Secondary | ICD-10-CM

## 2017-06-30 DIAGNOSIS — I10 Essential (primary) hypertension: Secondary | ICD-10-CM

## 2017-06-30 DIAGNOSIS — E7800 Pure hypercholesterolemia, unspecified: Secondary | ICD-10-CM

## 2017-06-30 LAB — BAYER DCA HB A1C WAIVED: HB A1C (BAYER DCA - WAIVED): 6.3 % (ref <7.0)

## 2017-06-30 NOTE — Patient Instructions (Signed)
Great to see you!  Come back to see Dr. Warrick Parisian in 3 months  Come back for an annual wellness visit at your convenience, you can schedule up front.

## 2017-06-30 NOTE — Progress Notes (Signed)
   HPI  Patient presents today here for follow-up chronic medical conditions. Doing well on current medications for diabetes, reports good compliance Fasting average blood sugars around 130 He is watching his diet He exercises regularly by [place] walks [place].  Hypertension Good medication compliance No headache or chest pain.  Good medication compliance with Lipitor.   PMH: Smoking status noted ROS: Per HPI  Objective: BP 135/77   Pulse 64   Temp (!) 97.2 F (36.2 C) (Oral)   Ht 5' 11.25" (1.81 m)   Wt 187 lb 6.4 oz (85 kg)   BMI 25.95 kg/m  Gen: NAD, alert, cooperative with exam HEENT: NCAT CV: RRR, good S1/S2, no murmur Resp: CTABL, no wheezes, non-labored Ext: No edema, warm Neuro: Alert and oriented, No gross deficits  Assessment and plan:  #Type 2 diabetes Well controlled with A1c of 6.3, no changes Continue Janumet plus Actos, we have discussed risks/ interaction with CHF or Actos  #Hypertension Well-controlled No changes  #Hyperlipidemia Continue statin, CMP today  Counseling provided for all vaccines vaccine components, we discussed Shingrix  Orders Placed This Encounter  Procedures  . Bayer DCA Hb A1c Waived  . Marre[place], MD [facility] 06/30/2017, 8:44 AM

## 2017-07-01 LAB — CMP14+EGFR
ALBUMIN: 4.4 g/dL (ref 3.6–4.8)
ALT: 18 IU/L (ref 0–44)
AST: 22 IU/L (ref 0–40)
Albumin/Globulin Ratio: 1.7 (ref 1.2–2.2)
Alkaline Phosphatase: 53 IU/L (ref 39–117)
BILIRUBIN TOTAL: 0.4 mg/dL (ref 0.0–1.2)
BUN / CREAT RATIO: 18 (ref 10–24)
BUN: 23 mg/dL (ref 8–27)
CO2: 24 mmol/L (ref 20–29)
Calcium: 9.4 mg/dL (ref 8.6–10.2)
Chloride: 102 mmol/L (ref 96–106)
Creatinine, Ser: 1.25 mg/dL (ref 0.76–1.27)
GFR calc non Af Amer: 60 mL/min/{1.73_m2} (ref >59)
GFR, EST AFRICAN AMERICAN: 69 mL/min/{1.73_m2} (ref >59)
GLOBULIN, TOTAL: 2.6 g/dL (ref 1.5–4.5)
GLUCOSE: 110 mg/dL — AB (ref 65–99)
Potassium: 4.6 mmol/L (ref 3.5–5.2)
SODIUM: 141 mmol/L (ref 134–144)
TOTAL PROTEIN: 7 g/dL (ref 6.0–8.5)

## 2017-07-19 ENCOUNTER — Ambulatory Visit (INDEPENDENT_AMBULATORY_CARE_PROVIDER_SITE_OTHER): Payer: Medicare Other | Admitting: Family Medicine

## 2017-07-19 ENCOUNTER — Encounter: Payer: Self-pay | Admitting: Family Medicine

## 2017-07-19 ENCOUNTER — Ambulatory Visit: Payer: Medicare Other | Admitting: Family Medicine

## 2017-07-19 VITALS — BP 137/71 | HR 74 | Temp 97.9°F | Ht 71.25 in | Wt 188.8 lb

## 2017-07-19 DIAGNOSIS — Z23 Encounter for immunization: Secondary | ICD-10-CM | POA: Diagnosis not present

## 2017-07-19 DIAGNOSIS — Z0000 Encounter for general adult medical examination without abnormal findings: Secondary | ICD-10-CM

## 2017-07-19 NOTE — Progress Notes (Signed)
Subjective:   Tony Hodges is a 67 y.o. male who presents for a Welcome to Medicare exam.   Review of Systems: No chest pain No headaches No shortness of breath No abdominal pain No constipation No new joint pains, has occasional shoulder pain  Cardiac Risk Factors include: male gender;diabetes mellitus;hypertension     Objective:    Today's Vitals   07/19/17 1350  BP: 137/71  Pulse: 74  Temp: 97.9 F (36.6 C)  TempSrc: Oral  Weight: 188 lb 12.8 oz (85.6 kg)  Height: 5' 11.25" (1.81 m)   Body mass index is 26.15 kg/m.  Medications Outpatient Encounter Medications as of 07/19/2017  Medication Sig  . amLODipine-benazepril (LOTREL) 5-10 MG capsule TAKE 1 CAPSULE BY MOUTH EVERY DAY  . aspirin (LONGS ADULT LOW STRENGTH ASA) 81 MG EC tablet Take 81 mg by mouth daily.    Marland Kitchen atorvastatin (LIPITOR) 20 MG tablet Take 0.5 tablets (10 mg total) by mouth at bedtime.  . diclofenac sodium (VOLTAREN) 1 % GEL Apply 2 g topically 4 (four) times daily.  . Lancets (ONETOUCH ULTRASOFT) lancets Check blood sugar bid. Dx E11.9  . ONE TOUCH ULTRA TEST test strip CHECK TWICE A DAY  . pioglitazone (ACTOS) 30 MG tablet Take 1 tablet (30 mg total) by mouth daily.  . sitaGLIPtin-metformin (JANUMET) 50-1000 MG tablet Take 1 tablet by mouth 2 (two) times daily with a meal.   No facility-administered encounter medications on file as of 07/19/2017.      History: Past Medical History:  Diagnosis Date  . Diabetes mellitus   . Erectile dysfunction   . Hyperlipidemia   . Hypertension    Past Surgical History:  Procedure Laterality Date  . UMBILICAL HERNIA REPAIR  2010    Family History  Problem Relation Age of Onset  . Heart attack Father 75  . Colon cancer Neg Hx   . Esophageal cancer Neg Hx   . Rectal cancer Neg Hx   . Stomach cancer Neg Hx    Social History   Occupational History  . Not on file  Tobacco Use  . Smoking status: Former Smoker    Last attempt to quit: 09/05/1977   Years since quitting: 39.8  . Smokeless tobacco: Never Used  Substance and Sexual Activity  . Alcohol use: Yes    Alcohol/week: 1.2 oz    Types: 2 Glasses of wine per week  . Drug use: No  . Sexual activity: Not on file   Tobacco Counseling Counseling given: Not Answered   Immunizations and Health Maintenance Immunization History  Administered Date(s) Administered  . Influenza, High Dose Seasonal PF 12/27/2016  . Influenza,inj,Quad PF,6+ Mos 01/09/2013, 10/12/2013, 01/02/2015, 11/25/2015  . Pneumococcal Conjugate-13 06/14/2016  . Td 12/12/2007   Health Maintenance Due  Topic Date Due  . Hepatitis C Screening  1950/06/07  . PNA vac Low Risk Adult (2 of 2 - PPSV23) 06/14/2017    Activities of Daily Living In your present state of health, do you have any difficulty performing the following activities: 07/19/2017  Hearing? Y  Vision? N  Difficulty concentrating or making decisions? N  Walking or climbing stairs? N  Dressing or bathing? N  Doing errands, shopping? N  Preparing Food and eating ? N  Using the Toilet? N  In the past six months, have you accidently leaked urine? N  Do you have problems with loss of bowel control? N  Managing your Medications? N  Managing your Finances? N  Housekeeping or  managing your Housekeeping? N  Some recent data might be hidden    Physical Exam   Gen: NAD, alert, cooperative with exam HEENT: NCAT CV: RRR, good S1/S2, no murmur Resp: CTABL, no wheezes, non-labored Abd: SNTND, BS present, no guarding or organomegaly Ext: No edema, warm Neuro: Alert and oriented, No gross deficits  Advanced Directives: Does Patient Have a Medical Advance Directive?: Yes, No Does patient want to make changes to medical advance directive?: No - Patient declined    Assessment:    This is a routine wellness  examination for this patient .   Vision/Hearing screen No exam data present  Dietary issues and exercise activities discussed:  Current  Exercise Habits: Home exercise routine, Type of exercise: walking, Time (Minutes): 60, Frequency (Times/Week): 3, Weekly Exercise (Minutes/Week): 180, Intensity: Mild  Goals    None      Depression Screen PHQ 2/9 Scores 07/19/2017 06/30/2017 03/30/2017 12/27/2016  PHQ - 2 Score 0 0 0 0     Fall Risk Fall Risk  07/19/2017  Falls in the past year? No  Number falls in past yr: -  Injury with Fall? -  Comment -    Cognitive Function MMSE - Mini Mental State Exam 07/19/2017  Orientation to time 5  Orientation to Place 5  Registration 3  Attention/ Calculation 5  Recall 3  Language- name 2 objects 2  Language- repeat 1  Language- follow 3 step command 3  Language- read & follow direction 1  Write a sentence 1  Copy design 1  Total score 30        Patient Care Team: Timmothy Euler, MD as PCP - General (Family Medicine) Inda Castle, MD (Inactive) as Attending Physician (Gastroenterology)     Plan:     Pneumovax given today Discussed hep C screening with next blood draw.  Counseling provided for all vaccines and vaccine components-Pneumovax  I have personally reviewed and noted the following in the patient's chart:   . Medical and social history . Use of alcohol, tobacco or illicit drugs  . Current medications and supplements . Functional ability and status . Nutritional status . Physical activity . Advanced directives . List of other physicians . Hospitalizations, surgeries, and ER visits in previous 12 months . Vitals . Screenings to include cognitive, depression, and falls . Referrals and appointments  In addition, I have reviewed and discussed with patient certain preventive protocols, quality metrics, and best practice recommendations. A written personalized care plan for preventive services as well as general preventive health recommendations were provided to patient.    Kenn File, MD 07/19/2017

## 2017-07-19 NOTE — Patient Instructions (Signed)
Great to see you!   

## 2017-10-03 ENCOUNTER — Encounter: Payer: Self-pay | Admitting: Family Medicine

## 2017-10-03 ENCOUNTER — Ambulatory Visit (INDEPENDENT_AMBULATORY_CARE_PROVIDER_SITE_OTHER): Payer: Medicare Other | Admitting: Family Medicine

## 2017-10-03 VITALS — BP 148/85 | HR 68 | Temp 97.4°F | Ht 71.25 in | Wt 183.8 lb

## 2017-10-03 DIAGNOSIS — I10 Essential (primary) hypertension: Secondary | ICD-10-CM

## 2017-10-03 DIAGNOSIS — E0869 Diabetes mellitus due to underlying condition with other specified complication: Secondary | ICD-10-CM | POA: Diagnosis not present

## 2017-10-03 DIAGNOSIS — S46111A Strain of muscle, fascia and tendon of long head of biceps, right arm, initial encounter: Secondary | ICD-10-CM

## 2017-10-03 DIAGNOSIS — E0800 Diabetes mellitus due to underlying condition with hyperosmolarity without nonketotic hyperglycemic-hyperosmolar coma (NKHHC): Secondary | ICD-10-CM

## 2017-10-03 DIAGNOSIS — Z1159 Encounter for screening for other viral diseases: Secondary | ICD-10-CM | POA: Diagnosis not present

## 2017-10-03 DIAGNOSIS — E7800 Pure hypercholesterolemia, unspecified: Secondary | ICD-10-CM

## 2017-10-03 DIAGNOSIS — S46119A Strain of muscle, fascia and tendon of long head of biceps, unspecified arm, initial encounter: Secondary | ICD-10-CM

## 2017-10-03 LAB — BAYER DCA HB A1C WAIVED: HB A1C: 6.6 % (ref <7.0)

## 2017-10-03 MED ORDER — DICLOFENAC SODIUM 1 % TD GEL: 2.0000 g | Freq: Four times a day (QID) | TRANSDERMAL | 1 refills | Status: DC

## 2017-10-03 MED ORDER — METHYLPREDNISOLONE ACETATE 80 MG/ML IJ SUSP
40.0000 mg | Freq: Once | INTRAMUSCULAR | Status: AC
  Administered 2017-10-03: 40 mg via INTRAMUSCULAR

## 2017-10-03 NOTE — Progress Notes (Signed)
BP (!) 148/85   Pulse 68   Temp (!) 97.4 F (36.3 C) (Oral)   Ht 5' 11.25" (1.81 m)   Wt 183 lb 12.8 oz (83.4 kg)   BMI 25.46 kg/m    Subjective:    Patient ID: Tony Hodges, male    DOB: 1950/08/22, 67 y.o.   MRN: [id number]  HPI: Tony Hodges is a 67 y.o. male presenting on 10/03/2017 for Diabetes (3 month follow up); Hypertension; Establish Care Wendi Snipes pt); and Shoulder Pain (Right x 6 weeks )   HPI Type 2 diabetes mellitus Patient comes in today for recheck of his diabetes. Patient has been currently taking Janumet and Actos. Patient is currently on an ACE inhibitor/ARB. Patient has not seen an ophthalmologist this year. Patient denies any issues with their feet.  Patient has associated hypertension and cholesterol  Hypertension Patient is currently on amlodipine and benazepril, and their blood pressure today is 148/85. Patient denies any lightheadedness or dizziness. Patient denies headaches, blurred vision, chest pains, shortness of breath, or weakness. Denies any side effects from medication and is content with current medication.   Hyperlipidemia Patient is coming in for recheck of his hyperlipidemia. The patient is currently taking atorvastatin. They deny any issues with myalgias or history of liver damage from it. They deny any focal numbness or weakness or chest pain.   Right shoulder pain, anterior Patient has noticed that he had right shoulder pain on the anterior aspect of the shoulder that is been going on for 6 weeks but has been improving.  He says he was trying to do cannonball the pool raise his arms over his head and felt a tearing a pop at the time.  Relevant past medical, surgical, family and social history reviewed and updated as indicated. Interim medical history since our last visit reviewed. Allergies and medications reviewed and updated.  Review of Systems  Constitutional: Negative for chills and fever.  Eyes: Negative for visual disturbance.    Respiratory: Negative for shortness of breath and wheezing.   Cardiovascular: Negative for chest pain and leg swelling.  Musculoskeletal: Positive for arthralgias. Negative for back pain and gait problem.  Skin: Negative for rash.  All other systems reviewed and are negative.   Per HPI unless specifically indicated above   Allergies as of 10/03/2017   No Known Allergies     Medication List        Accurate as of 10/03/17  8:51 AM. Always use your most recent med list.          amLODipine-benazepril 5-10 MG capsule Commonly known as:  LOTREL TAKE 1 CAPSULE BY MOUTH EVERY DAY   atorvastatin 20 MG tablet Commonly known as:  LIPITOR Take 0.5 tablets (10 mg total) by mouth at bedtime.   diclofenac sodium 1 % Gel Commonly known as:  VOLTAREN Apply 2 g topically 4 (four) times daily.   LONGS ADULT LOW STRENGTH ASA 81 MG EC tablet Generic drug:  aspirin Take 81 mg by mouth daily.   ONE TOUCH ULTRA TEST test strip Generic drug:  glucose blood CHECK TWICE A DAY   onetouch ultrasoft lancets Check blood sugar bid. Dx E11.9   pioglitazone 30 MG tablet Commonly known as:  ACTOS Take 1 tablet (30 mg total) by mouth daily.   sitaGLIPtin-metformin 50-1000 MG tablet Commonly known as:  JANUMET Take 1 tablet by mouth 2 (two) times daily with a meal.          Objective:  BP (!) 148/85   Pulse 68   Temp (!) 97.4 F (36.3 C) (Oral)   Ht 5' 11.25" (1.81 m)   Wt 183 lb 12.8 oz (83.4 kg)   BMI 25.46 kg/m   Wt Readings from Last 3 Encounters:  10/03/17 183 lb 12.8 oz (83.4 kg)  07/19/17 188 lb 12.8 oz (85.6 kg)  06/30/17 187 lb 6.4 oz (85 kg)    Physical Exam  Constitutional: He is oriented to person, place, and time. He appears well-developed and well-nourished. No distress.  Eyes: Conjunctivae are normal. No scleral icterus.  Neck: Neck supple. No thyromegaly present.  Cardiovascular: Normal rate, regular rhythm, normal heart sounds and intact distal pulses.  No  murmur heard. Pulmonary/Chest: Effort normal and breath sounds normal. No respiratory distress. He has no wheezes.  Musculoskeletal: Normal range of motion. He exhibits tenderness (Pain over his anterior shoulder overlying where the long head of the biceps would be, pain with overhead range of motion, negative rotator cuff testing). He exhibits no edema.  Lymphadenopathy:    He has no cervical adenopathy.  Neurological: He is alert and oriented to person, place, and time. Coordination normal.  Skin: Skin is warm and dry. No rash noted. He is not diaphoretic.  Psychiatric: He has a normal mood and affect. His behavior is normal.  Nursing note and vitals reviewed.   Results for orders placed or performed in visit on 06/30/17  Bayer DCA Hb A1c Waived  Result Value Ref Range   HB A1C (BAYER DCA - WAIVED) 6.3 <7.0 %  CMP14+EGFR  Result Value Ref Range   Glucose 110 (H) 65 - 99 mg/dL   BUN 23 8 - 27 mg/dL   Creatinine, Ser 1.25 0.76 - 1.27 mg/dL   GFR calc non Af Amer 60 >59 mL/min/1.73   GFR calc Af Amer 69 >59 mL/min/1.73   BUN/Creatinine Ratio 18 10 - 24   Sodium 141 134 - 144 mmol/L   Potassium 4.6 3.5 - 5.2 mmol/L   Chloride 102 96 - 106 mmol/L   CO2 24 20 - 29 mmol/L   Calcium 9.4 8.6 - 10.2 mg/dL   Total Protein 7.0 6.0 - 8.5 g/dL   Albumin 4.4 3.6 - 4.8 g/dL   Globulin, Total 2.6 1.5 - 4.5 g/dL   Albumin/Globulin Ratio 1.7 1.2 - 2.2   Bilirubin Total 0.4 0.0 - 1.2 mg/dL   Alkaline Phosphatase 53 39 - 117 IU/L   AST 22 0 - 40 IU/L   ALT 18 0 - 44 IU/L   Right biceps tendon injection: Consent form signed. Risk factors of bleeding and infection discussed with patient and patient is agreeable towards injection. Patient prepped with Betadine. Lateral approach towards injection used. Injected 40 mg of Depo-Medrol and 1 mL of 2% lidocaine. Patient tolerated procedure well and no side effects from noted. Minimal to no bleeding. Simple bandage applied after.     Assessment & Plan:    Problem List Items Addressed This Visit      Cardiovascular and Mediastinum   HTN (hypertension)     Endocrine   Diabetes mellitus ([place]) - Primary   Relevant Orders   Bayer DCA Hb A1c Waived     Other   HLD (hyperlipidemia)    Other Visit Diagnoses    Need for hepatitis C screening test       Relevant Orders   Hepatitis C antibody   Traumatic partial tear of biceps tendon, initial encounter  Relevant Medications   methylPREDNISolone acetate (DEPO-MEDROL) injection 40 mg       Follow up plan: Return in about 3 months (around 01/02/2018), or if symptoms worsen or fail to improve, for Diabetes and hypertension check.  Counseling provided for all of the vaccine components Orders Placed This Encounter  Procedures  . Hepatitis C antibody  . Bayer [facility] Hb A1c [place], MD [facility] 10/03/2017, 8:51 AM

## 2017-10-04 LAB — HEPATITIS C ANTIBODY: Hep C Virus Ab: 0.2 s/co ratio (ref 0.0–0.9)

## 2017-10-17 ENCOUNTER — Ambulatory Visit (INDEPENDENT_AMBULATORY_CARE_PROVIDER_SITE_OTHER): Payer: Medicare Other

## 2017-10-17 DIAGNOSIS — Z23 Encounter for immunization: Secondary | ICD-10-CM | POA: Diagnosis not present

## 2017-11-04 DIAGNOSIS — M25511 Pain in right shoulder: Secondary | ICD-10-CM | POA: Insufficient documentation

## 2017-11-25 DIAGNOSIS — M25511 Pain in right shoulder: Secondary | ICD-10-CM | POA: Diagnosis not present

## 2017-11-25 DIAGNOSIS — M75121 Complete rotator cuff tear or rupture of right shoulder, not specified as traumatic: Secondary | ICD-10-CM | POA: Diagnosis not present

## 2017-12-06 DIAGNOSIS — M25511 Pain in right shoulder: Secondary | ICD-10-CM | POA: Diagnosis not present

## 2017-12-12 DIAGNOSIS — M75101 Unspecified rotator cuff tear or rupture of right shoulder, not specified as traumatic: Secondary | ICD-10-CM | POA: Diagnosis not present

## 2017-12-27 DIAGNOSIS — L820 Inflamed seborrheic keratosis: Secondary | ICD-10-CM | POA: Diagnosis not present

## 2017-12-27 DIAGNOSIS — D485 Neoplasm of uncertain behavior of skin: Secondary | ICD-10-CM | POA: Diagnosis not present

## 2017-12-27 DIAGNOSIS — B078 Other viral warts: Secondary | ICD-10-CM | POA: Diagnosis not present

## 2017-12-27 DIAGNOSIS — D229 Melanocytic nevi, unspecified: Secondary | ICD-10-CM | POA: Diagnosis not present

## 2018-01-02 ENCOUNTER — Ambulatory Visit: Payer: Medicare Other | Admitting: Family Medicine

## 2018-01-02 ENCOUNTER — Ambulatory Visit (INDEPENDENT_AMBULATORY_CARE_PROVIDER_SITE_OTHER): Payer: Medicare Other | Admitting: Family Medicine

## 2018-01-02 ENCOUNTER — Encounter: Payer: Self-pay | Admitting: Family Medicine

## 2018-01-02 VITALS — BP 133/78 | HR 68 | Temp 97.3°F | Ht 71.25 in | Wt 188.4 lb

## 2018-01-02 DIAGNOSIS — Z23 Encounter for immunization: Secondary | ICD-10-CM

## 2018-01-02 DIAGNOSIS — E1169 Type 2 diabetes mellitus with other specified complication: Secondary | ICD-10-CM

## 2018-01-02 DIAGNOSIS — Z01818 Encounter for other preprocedural examination: Secondary | ICD-10-CM | POA: Diagnosis not present

## 2018-01-02 DIAGNOSIS — E785 Hyperlipidemia, unspecified: Secondary | ICD-10-CM

## 2018-01-02 DIAGNOSIS — N401 Enlarged prostate with lower urinary tract symptoms: Secondary | ICD-10-CM | POA: Diagnosis not present

## 2018-01-02 DIAGNOSIS — E0869 Diabetes mellitus due to underlying condition with other specified complication: Secondary | ICD-10-CM | POA: Diagnosis not present

## 2018-01-02 DIAGNOSIS — I10 Essential (primary) hypertension: Secondary | ICD-10-CM

## 2018-01-02 DIAGNOSIS — R3912 Poor urinary stream: Secondary | ICD-10-CM | POA: Diagnosis not present

## 2018-01-02 LAB — BAYER DCA HB A1C WAIVED: HB A1C (BAYER DCA - WAIVED): 6.9 % (ref <7.0)

## 2018-01-02 MED ORDER — AMLODIPINE BESY-BENAZEPRIL HCL 5-10 MG PO CAPS: ORAL_CAPSULE | ORAL | 3 refills | Status: DC

## 2018-01-02 MED ORDER — SITAGLIPTIN PHOS-METFORMIN HCL 50-1000 MG PO TABS: 1.0000 | ORAL_TABLET | Freq: Two times a day (BID) | ORAL | 3 refills | Status: DC

## 2018-01-02 MED ORDER — TRAZODONE HCL 50 MG PO TABS: 25.0000 mg | ORAL_TABLET | Freq: Every evening | ORAL | 1 refills | Status: DC | PRN

## 2018-01-02 MED ORDER — PIOGLITAZONE HCL 30 MG PO TABS: 30.0000 mg | ORAL_TABLET | Freq: Every day | ORAL | 3 refills | Status: DC

## 2018-01-02 MED ORDER — ATORVASTATIN CALCIUM 20 MG PO TABS: 10.0000 mg | ORAL_TABLET | Freq: Every day | ORAL | 3 refills | Status: DC

## 2018-01-02 NOTE — Progress Notes (Signed)
BP 133/78   Pulse 68   Temp (!) 97.3 F (36.3 C) (Oral)   Ht 5' 11.25" (1.81 m)   Wt 188 lb 6.4 oz (85.5 kg)   BMI 26.09 kg/m    Subjective:    Patient ID: Tony Hodges, male    DOB: 04-14-1950, 67 y.o.   MRN: [id number]  HPI: Tony Hodges is a 67 y.o. male presenting on 01/02/2018 for Hypertension (3 month followup); Diabetes; and surgical clearance   HPI Type 2 diabetes mellitus Patient comes in today for recheck of his diabetes. Patient has been currently taking Janumet and Actos. Patient is currently on an ACE inhibitor/ARB. Patient has seen an ophthalmologist this year. Patient denies any issues with their feet.   Hyperlipidemia Patient is coming in for recheck of his hyperlipidemia. The patient is currently taking Lipitor 10 mg. They deny any issues with myalgias or history of liver damage from it. They deny any focal numbness or weakness or chest pain.   Hypertension Patient is currently on amlodipine-benazepril, and their blood pressure today is 133/78. Patient denies any lightheadedness or dizziness. Patient denies headaches, blurred vision, chest pains, shortness of breath, or weakness. Denies any side effects from medication and is content with current medication.   BPH Patient is coming in for recheck of his PSA today, he denies any major urinary issues today.  Patient is coming in for surgical preclearance where he is going to get a right rotator cuff repair and we did an EKG will do labs today.  Relevant past medical, surgical, family and social history reviewed and updated as indicated. Interim medical history since our last visit reviewed. Allergies and medications reviewed and updated.  Review of Systems  Constitutional: Negative for chills and fever.  Eyes: Negative for discharge.  Respiratory: Negative for shortness of breath and wheezing.   Cardiovascular: Negative for chest pain and leg swelling.  Musculoskeletal: Negative for back pain and gait  problem.  Skin: Negative for rash.  Neurological: Negative for dizziness, weakness, light-headedness and numbness.  All other systems reviewed and are negative.   Per HPI unless specifically indicated above   Allergies as of 01/02/2018   No Known Allergies     Medication List       Accurate as of January 02, 2018 11:29 AM. Always use your most recent med list.        amLODipine-benazepril 5-10 MG capsule Commonly known as:  LOTREL TAKE 1 CAPSULE BY MOUTH EVERY DAY   atorvastatin 20 MG tablet Commonly known as:  LIPITOR Take 0.5 tablets (10 mg total) by mouth at bedtime.   diclofenac sodium 1 % Gel Commonly known as:  VOLTAREN Apply 2 g topically 4 (four) times daily.   LONGS ADULT LOW STRENGTH ASA 81 MG EC tablet Generic drug:  aspirin Take 81 mg by mouth daily.   ONE TOUCH ULTRA TEST test strip Generic drug:  glucose blood CHECK TWICE A DAY   onetouch ultrasoft lancets Check blood sugar bid. Dx E11.9   pioglitazone 30 MG tablet Commonly known as:  ACTOS Take 1 tablet (30 mg total) by mouth daily.   sitaGLIPtin-metformin 50-1000 MG tablet Commonly known as:  JANUMET Take 1 tablet by mouth 2 (two) times daily with a meal.   traZODone 50 MG tablet Commonly known as:  DESYREL Take 0.5-1 tablets (25-50 mg total) by mouth at bedtime as needed for sleep.          Objective:    BP  133/78   Pulse 68   Temp (!) 97.3 F (36.3 C) (Oral)   Ht 5' 11.25" (1.81 m)   Wt 188 lb 6.4 oz (85.5 kg)   BMI 26.09 kg/m   Wt Readings from Last 3 Encounters:  01/02/18 188 lb 6.4 oz (85.5 kg)  10/03/17 183 lb 12.8 oz (83.4 kg)  07/19/17 188 lb 12.8 oz (85.6 kg)    Physical Exam Vitals signs and nursing note reviewed.  Constitutional:      General: He is not in acute distress.    Appearance: He is well-developed. He is not diaphoretic.  Eyes:     General: No scleral icterus.       Right eye: No discharge.     Conjunctiva/sclera: Conjunctivae normal.     Pupils:  Pupils are equal, round, and reactive to light.  Neck:     Musculoskeletal: Neck supple.     Thyroid: No thyromegaly.  Cardiovascular:     Rate and Rhythm: Normal rate and regular rhythm.     Heart sounds: Normal heart sounds. No murmur.  Pulmonary:     Effort: Pulmonary effort is normal. No respiratory distress.     Breath sounds: Normal breath sounds. No wheezing.  Musculoskeletal: Normal range of motion.  Lymphadenopathy:     Cervical: No cervical adenopathy.  Skin:    General: Skin is warm and dry.     Findings: No rash.  Neurological:     Mental Status: He is alert and oriented to person, place, and time.     Coordination: Coordination normal.  Psychiatric:        Behavior: Behavior normal.     EKG: Normal sinus rhythm    Assessment & Plan:   Problem List Items Addressed This Visit      Cardiovascular and Mediastinum   HTN (hypertension)   Relevant Medications   amLODipine-benazepril (LOTREL) 5-10 MG capsule   atorvastatin (LIPITOR) 20 MG tablet   Other Relevant Orders   CMP14+EGFR     Endocrine   Hyperlipidemia associated with type 2 diabetes mellitus (HCC)   Relevant Medications   amLODipine-benazepril (LOTREL) 5-10 MG capsule   atorvastatin (LIPITOR) 20 MG tablet   pioglitazone (ACTOS) 30 MG tablet   sitaGLIPtin-metformin (JANUMET) 50-1000 MG tablet   Other Relevant Orders   Lipid panel   Diabetes mellitus (HCC)   Relevant Medications   amLODipine-benazepril (LOTREL) 5-10 MG capsule   atorvastatin (LIPITOR) 20 MG tablet   pioglitazone (ACTOS) 30 MG tablet   sitaGLIPtin-metformin (JANUMET) 50-1000 MG tablet   Other Relevant Orders   Bayer DCA Hb A1c Waived     Genitourinary   BPH (benign prostatic hyperplasia)   Relevant Orders   PSA, total and free    Other Visit Diagnoses    Pre-op evaluation    -  Primary   Relevant Orders   EKG 12-Lead (Completed)   CBC with Differential/Platelet   CMP14+EGFR   Lipid panel   Bayer DCA Hb A1c Waived       Patient is concerned that he will have some issues sleeping after his surgery and normally takes melatonin but would like something just in case he needs something stronger for that time.,  Will send trazodone  Patient is cleared for surgery as long as blood work comes back within normal range as it has, his previous A1c was 6.6 Follow up plan: Return in about 3 months (around 04/03/2018), or if symptoms worsen or fail to improve, for Diabetes and hypertension.  Counseling provided for all of the vaccine components Orders Placed This Encounter  Procedures  . CBC with Differential/Platelet  . CMP14+EGFR  . Lipid panel  . Bayer DCA Hb A1c Waived  . PSA, total and free  . EKG 12-Lead    Caryl Pina, MD [facility] 01/02/2018, 11:29 AM

## 2018-01-02 NOTE — Addendum Note (Signed)
Addended by: Nigel Berthold C on: 01/02/2018 11:55 AM   Modules accepted: Orders

## 2018-01-03 LAB — CBC WITH DIFFERENTIAL/PLATELET
BASOS ABS: 0.1 10*3/uL (ref 0.0–0.2)
BASOS: 1 %
EOS (ABSOLUTE): 0.1 10*3/uL (ref 0.0–0.4)
Eos: 1 %
HEMOGLOBIN: 13.7 g/dL (ref 13.0–17.7)
Hematocrit: 41 % (ref 37.5–51.0)
IMMATURE GRANS (ABS): 0 10*3/uL (ref 0.0–0.1)
IMMATURE GRANULOCYTES: 0 %
Lymphocytes Absolute: 1.3 10*3/uL (ref 0.7–3.1)
Lymphs: 18 %
MCH: 29.9 pg (ref 26.6–33.0)
MCHC: 33.4 g/dL (ref 31.5–35.7)
MCV: 90 fL (ref 79–97)
MONOCYTES: 8 %
Monocytes Absolute: 0.6 10*3/uL (ref 0.1–0.9)
NEUTROS ABS: 5.4 10*3/uL (ref 1.4–7.0)
NEUTROS PCT: 72 %
PLATELETS: 240 10*3/uL (ref 150–450)
RBC: 4.58 x10E6/uL (ref 4.14–5.80)
RDW: 12.8 % (ref 12.3–15.4)
WBC: 7.4 10*3/uL (ref 3.4–10.8)

## 2018-01-03 LAB — LIPID PANEL
CHOL/HDL RATIO: 2.4 ratio (ref 0.0–5.0)
Cholesterol, Total: 143 mg/dL (ref 100–199)
HDL: 59 mg/dL (ref >39)
LDL Calculated: 72 mg/dL (ref 0–99)
Triglycerides: 61 mg/dL (ref 0–149)
VLDL CHOLESTEROL CAL: 12 mg/dL (ref 5–40)

## 2018-01-03 LAB — PSA, TOTAL AND FREE
PSA, Free Pct: 32.5 %
PSA, Free: 0.39 ng/mL
Prostate Specific Ag, Serum: 1.2 ng/mL (ref 0.0–4.0)

## 2018-01-03 LAB — CMP14+EGFR
ALBUMIN: 4.3 g/dL (ref 3.6–4.8)
ALT: 13 IU/L (ref 0–44)
AST: 18 IU/L (ref 0–40)
Albumin/Globulin Ratio: 1.8 (ref 1.2–2.2)
Alkaline Phosphatase: 54 IU/L (ref 39–117)
BUN / CREAT RATIO: 17 (ref 10–24)
BUN: 20 mg/dL (ref 8–27)
Bilirubin Total: 0.3 mg/dL (ref 0.0–1.2)
CO2: 23 mmol/L (ref 20–29)
CREATININE: 1.21 mg/dL (ref 0.76–1.27)
Calcium: 9.4 mg/dL (ref 8.6–10.2)
Chloride: 102 mmol/L (ref 96–106)
GFR, EST AFRICAN AMERICAN: 72 mL/min/{1.73_m2} (ref >59)
GFR, EST NON AFRICAN AMERICAN: 62 mL/min/{1.73_m2} (ref >59)
GLUCOSE: 116 mg/dL — AB (ref 65–99)
Globulin, Total: 2.4 g/dL (ref 1.5–4.5)
Potassium: 5 mmol/L (ref 3.5–5.2)
Sodium: 139 mmol/L (ref 134–144)
TOTAL PROTEIN: 6.7 g/dL (ref 6.0–8.5)

## 2018-01-13 ENCOUNTER — Other Ambulatory Visit: Payer: Self-pay | Admitting: Family Medicine

## 2018-01-13 MED ORDER — ATORVASTATIN CALCIUM 20 MG PO TABS: 10.0000 mg | ORAL_TABLET | Freq: Every day | ORAL | 3 refills | Status: DC

## 2018-01-13 NOTE — Telephone Encounter (Signed)
Pt aware #45 for 90 day supply sent to pharmacy

## 2018-01-18 ENCOUNTER — Telehealth: Payer: Self-pay | Admitting: Family Medicine

## 2018-01-19 NOTE — Telephone Encounter (Signed)
Faxed EKG on regular fax to [place]

## 2018-01-24 DIAGNOSIS — S46011A Strain of muscle(s) and tendon(s) of the rotator cuff of right shoulder, initial encounter: Secondary | ICD-10-CM | POA: Diagnosis not present

## 2018-01-24 DIAGNOSIS — Y999 Unspecified external cause status: Secondary | ICD-10-CM | POA: Diagnosis not present

## 2018-01-24 DIAGNOSIS — M19011 Primary osteoarthritis, right shoulder: Secondary | ICD-10-CM | POA: Diagnosis not present

## 2018-01-24 DIAGNOSIS — M7541 Impingement syndrome of right shoulder: Secondary | ICD-10-CM | POA: Diagnosis not present

## 2018-01-24 DIAGNOSIS — S46191A Other injury of muscle, fascia and tendon of long head of biceps, right arm, initial encounter: Secondary | ICD-10-CM | POA: Diagnosis not present

## 2018-01-24 DIAGNOSIS — X58XXXA Exposure to other specified factors, initial encounter: Secondary | ICD-10-CM | POA: Diagnosis not present

## 2018-01-24 DIAGNOSIS — G8918 Other acute postprocedural pain: Secondary | ICD-10-CM | POA: Diagnosis not present

## 2018-01-24 DIAGNOSIS — S43491A Other sprain of right shoulder joint, initial encounter: Secondary | ICD-10-CM | POA: Diagnosis not present

## 2018-01-24 DIAGNOSIS — S43431A Superior glenoid labrum lesion of right shoulder, initial encounter: Secondary | ICD-10-CM | POA: Diagnosis not present

## 2018-01-27 ENCOUNTER — Other Ambulatory Visit: Payer: Self-pay | Admitting: Family Medicine

## 2018-02-01 DIAGNOSIS — Z4789 Encounter for other orthopedic aftercare: Secondary | ICD-10-CM | POA: Diagnosis not present

## 2018-02-02 ENCOUNTER — Ambulatory Visit: Payer: Medicare Other | Attending: Specialist | Admitting: Physical Therapy

## 2018-02-02 ENCOUNTER — Encounter: Payer: Self-pay | Admitting: Physical Therapy

## 2018-02-02 ENCOUNTER — Other Ambulatory Visit: Payer: Self-pay

## 2018-02-02 DIAGNOSIS — M25511 Pain in right shoulder: Secondary | ICD-10-CM | POA: Diagnosis not present

## 2018-02-02 DIAGNOSIS — M25611 Stiffness of right shoulder, not elsewhere classified: Secondary | ICD-10-CM | POA: Insufficient documentation

## 2018-02-02 DIAGNOSIS — M6281 Muscle weakness (generalized): Secondary | ICD-10-CM

## 2018-02-02 NOTE — Therapy (Signed)
[facility] [place], [place], [address] Phone: [phone]   Fax:  [phone]  Physical Therapy Evaluation  Patient Details  Name: Tony Hodges MRN: [id number] Date of Birth: Nov 06, 1950 Referring Provider (PT): Sydnee Cabal, MD   Encounter Date: 02/02/2018  PT End of Session - 02/02/18 1007    Visit Number  1    Number of Visits  12    Date for PT Re-Evaluation  03/09/18    Authorization Type  FOTO; progress note every 10th visit    PT Start Time  0915    PT Stop Time  1005    PT Time Calculation (min)  50 min    Activity Tolerance  Patient tolerated treatment well    Behavior During Therapy  [facility] for tasks assessed/performed       Past Medical History:  Diagnosis Date  . Diabetes mellitus   . Erectile dysfunction   . Hyperlipidemia   . Hypertension     Past Surgical History:  Procedure Laterality Date  . UMBILICAL HERNIA REPAIR  2010    There were no vitals filed for this visit.   Subjective Assessment - 02/02/18 1243    Subjective  Patient arrives to physical therapy with reports of right shoulder pain due to right shoulder RTC repair with Subacromial Decompression, Distal clavicle resection, and biceps tenodesis on 01/24/2018. Patient reports he is able to perform most ADLs but needs assistance from wife for dressing and donning on sling. Patient reports he is able to remove sling at home but is required to wear it in community and while sleeping. Patient reports pain at worst is 4/10 and pain at best is 0/10 with extra strength Tylenol and a muscle relaxer. Patient's goals are to decrease pain, improve movement, and return to recreational activities, like golf.     Pertinent History  HTN, DM, R RTC 01/24/2018    Limitations  Lifting;House hold activities    Diagnostic tests  MRI, X-Ray    Patient Stated Goals  improve movement, play golf    Currently in Pain?  Yes    Pain Score  3     Pain Location   Shoulder    Pain Orientation  Right    Pain Descriptors / Indicators  Aching    Pain Type  Surgical pain    Pain Onset  1 to 4 weeks ago    Pain Frequency  Intermittent    Aggravating Factors   Movement    Pain Relieving Factors  Rest, extra strength tylenol, muscle relaxer         [facility] PT Assessment - 02/02/18 0001      Assessment   Medical Diagnosis  right shoulder scope SAD/DCR/biceps tenodesis RTC repair    Referring Provider (PT)  Sydnee Cabal, MD    Onset Date/Surgical Date  01/24/18    Hand Dominance  Left    Next MD Visit  03/01/2018    Prior Therapy  no      Precautions   Precautions  Shoulder    Type of Shoulder Precautions  RTC repair; see media for RTC repair [place] [facility] protocol    Shoulder Interventions  Shoulder sling/immobilizer      Balance Screen   Has the patient fallen in the past 6 months  No    Has the patient had a decrease in activity level because of a fear of falling?   No    Is the patient reluctant to leave their  home because of a fear of falling?   No      Home Film/video editor residence    Living Arrangements  Spouse/significant other      Prior Function   Level of Independence  Independent with basic ADLs      Observation/Other Assessments   Observations  Band-aids covering 6 incisions;    Skin Integrity  moderate ecchymosis surrounding axilla    Focus on Therapeutic Outcomes (FOTO)   60% limited      ROM / Strength   AROM / PROM / Strength  PROM      PROM   PROM Assessment Site  Shoulder    Right/Left Shoulder  Right    Right Shoulder Flexion  69 Degrees    Right Shoulder ABduction  50 Degrees    Right Shoulder Internal Rotation  68 Degrees    Right Shoulder External Rotation  -4 Degrees                Objective measurements completed on examination: See above findings.      [name] - 02/02/18 0001      Modalities   Modalities  Electrical  Stimulation;Vasopneumatic      Acupuncturist Location  right shoulder    Electrical Stimulation Action  IFC    Electrical Stimulation Parameters  80-150 hz x15 mins    Electrical Stimulation Goals  Pain      Vasopneumatic   Number Minutes Vasopneumatic   15 minutes    Vasopnuematic Location   Shoulder    Vasopneumatic Pressure  Low    Vasopneumatic Temperature   34             PT Education - 02/02/18 1025    Education Details  pendulums, supine ER with wand per protocol    Person(s) Educated  Patient    Methods  Explanation;Demonstration;Handout    Comprehension  Verbalized understanding;Returned demonstration       PT Short Term Goals - 02/02/18 1245      PT SHORT TERM GOAL #1   Title  STG=LTG        PT Long Term Goals - 02/02/18 1245      PT LONG TERM GOAL #1   Title  Patient will be independent with HEP and its progression    Time  4    Period  Weeks    Status  New      PT LONG TERM GOAL #2   Title  Patient will demonstrate 150+ degrees of right shoulder flexion AROM to improve ability to perform functional tasks.    Time  4    Period  Weeks    Status  New      PT LONG TERM GOAL #3   Title  Patient will demonstrate 60+ degrees of right shoulder external rotation AROM to improve ability to don/doff apparel.    Time  4    Period  Weeks    Status  New      PT LONG TERM GOAL #4   Title  Patient will demonstrate 4/5 or greater right shoulder MMT to improve stability during functional tasks.    Time  4    Period  Weeks    Status  New      PT LONG TERM GOAL #5   Title  Patient will demonstrate ability to perfrom all ADLs independently with pain less than 3/10 in right shoulder.  Time  4    Period  Weeks    Status  New             Plan - 02/02/18 1007    Clinical Impression Statement  Patient is a 68 year old left handed male who presents to physical therapy with decreased right shoulder PROM secondary to  right shoulder RTC on 01/24/2018. Per operative report, a large tear in the right supraspinatus was repaired. Patient noted with moderate ecchymosis to right axilla region and has 6 band-aids to cover his 6 incisions. Patient provided with HEP consisting of pendulums and supine ER with PVC/cane per protocol. Patient would benefit from skilled physical therapy to address deficits and address patient's goals.     Clinical Presentation  Stable    Clinical Decision Making  Low    Rehab Potential  Excellent    PT Frequency  3x / week    PT Duration  4 weeks    PT Treatment/Interventions  Cryotherapy;Ultrasound;Moist Heat;ADLs/Self Care Home Management;Electrical Stimulation;Iontophoresis 4mg /ml Dexamethasone;Functional mobility training;Neuromuscular re-education;Therapeutic exercise;Therapeutic activities;Patient/family education;Manual techniques;Passive range of motion;Scar mobilization;Vasopneumatic Device    PT Next Visit Plan  Review HEP; PROM to right, see protocol; modalities for pain relief    PT Home Exercise Plan  see patient education section    Consulted and Agree with Plan of Care  Patient       Patient will benefit from skilled therapeutic intervention in order to improve the following deficits and impairments:  Pain, Decreased strength, Decreased range of motion, Impaired UE functional use  Visit Diagnosis: Acute pain of right shoulder - Plan: PT plan of care cert/re-cert  Stiffness of right shoulder, not elsewhere classified - Plan: PT plan of care cert/re-cert  Muscle weakness (generalized) - Plan: PT plan of care cert/re-cert     Problem List Patient Active Problem List   Diagnosis Date Noted  . BPH (benign prostatic hyperplasia) 11/25/2015  . Sprain and strain of metatarsophalangeal joint 04/02/2015  . Erectile dysfunction   . Diabetes mellitus ([place])   . Hyperlipidemia associated with type 2 diabetes mellitus ([place]) 05/25/2012  . HTN (hypertension) 05/25/2012   [name], PT, DPT 02/02/2018, 1:05 PM  [facility] [address] [place], [place], [address] Phone: [phone]   Fax:  [phone]  Name: Tony Hodges MRN: [id number] Date of Birth: July 15, 1950

## 2018-02-06 ENCOUNTER — Ambulatory Visit: Payer: Medicare Other | Admitting: Physical Therapy

## 2018-02-06 DIAGNOSIS — M25611 Stiffness of right shoulder, not elsewhere classified: Secondary | ICD-10-CM | POA: Diagnosis not present

## 2018-02-06 DIAGNOSIS — M25511 Pain in right shoulder: Secondary | ICD-10-CM

## 2018-02-06 DIAGNOSIS — M6281 Muscle weakness (generalized): Secondary | ICD-10-CM | POA: Diagnosis not present

## 2018-02-06 NOTE — Therapy (Signed)
[facility] [place], [place], [address] Phone: [phone]   Fax:  [phone]  Physical Therapy Treatment  Patient Details  Name: Tony Hodges MRN: [id number] Date of Birth: 1950-11-14 Referring Provider (PT): Sydnee Cabal, MD   Encounter Date: 02/06/2018  PT End of Session - 02/06/18 1054    Visit Number  2    Number of Visits  12    Date for PT Re-Evaluation  03/09/18    Authorization Type  FOTO; progress note every 10th visit    PT Start Time  0945    PT Stop Time  1044    PT Time Calculation (min)  59 min       Past Medical History:  Diagnosis Date  . Diabetes mellitus   . Erectile dysfunction   . Hyperlipidemia   . Hypertension     Past Surgical History:  Procedure Laterality Date  . UMBILICAL HERNIA REPAIR  2010    There were no vitals filed for this visit.  Subjective Assessment - 02/06/18 1038    Subjective  I'm doing my home exercises.    Pertinent History  HTN, DM, R RTC 01/24/2018    Limitations  Lifting;House hold activities    Diagnostic tests  MRI, X-Ray    Patient Stated Goals  improve movement, play golf    Currently in Pain?  Yes    Pain Score  3     Pain Orientation  Right    Pain Descriptors / Indicators  Aching    Pain Type  Surgical pain    Pain Onset  1 to 4 weeks ago                       [facility] Adult PT Treatment/Exercise - 02/06/18 0001      Vasopneumatic   Number Minutes Vasopneumatic   20 minutes    Vasopnuematic Location   --   Right shoulder.   Vasopneumatic Pressure  Low   Pillow btw right elbow and thorax.     Manual Therapy   Manual Therapy  Passive ROM    Passive ROM  In supine:  PROM x 23 minutes into right shoulder flexion and ER.               PT Short Term Goals - 02/02/18 1245      PT SHORT TERM GOAL #1   Title  STG=LTG        PT Long Term Goals - 02/02/18 1245      PT LONG TERM GOAL #1   Title  Patient will be  independent with HEP and its progression    Time  4    Period  Weeks    Status  New      PT LONG TERM GOAL #2   Title  Patient will demonstrate 150+ degrees of right shoulder flexion AROM to improve ability to perform functional tasks.    Time  4    Period  Weeks    Status  New      PT LONG TERM GOAL #3   Title  Patient will demonstrate 60+ degrees of right shoulder external rotation AROM to improve ability to don/doff apparel.    Time  4    Period  Weeks    Status  New      PT LONG TERM GOAL #4   Title  Patient will demonstrate 4/5 or greater right shoulder MMT to improve stability during  functional tasks.    Time  4    Period  Weeks    Status  New      PT LONG TERM GOAL #5   Title  Patient will demonstrate ability to perfrom all ADLs independently with pain less than 3/10 in right shoulder.    Time  4    Period  Weeks    Status  New            Plan - 02/06/18 1041    Clinical Impression Statement  Patient did very well today with right shoulder PROM.  He performed the passive countertop stretch to increase flexion without difficulty.    PT Treatment/Interventions  Cryotherapy;Ultrasound;Moist Heat;ADLs/Self Care Home Management;Electrical Stimulation;Iontophoresis 4mg /ml Dexamethasone;Functional mobility training;Neuromuscular re-education;Therapeutic exercise;Therapeutic activities;Patient/family education;Manual techniques;Passive range of motion;Scar mobilization;Vasopneumatic Device    PT Next Visit Plan  Review HEP; PROM to right, see protocol; modalities for pain relief    PT Home Exercise Plan  see patient education section    Consulted and Agree with Plan of Care  Patient       Patient will benefit from skilled therapeutic intervention in order to improve the following deficits and impairments:  Pain, Decreased strength, Decreased range of motion, Impaired UE functional use  Visit Diagnosis: Acute pain of right shoulder  Stiffness of right shoulder, not  elsewhere classified     Problem List Patient Active Problem List   Diagnosis Date Noted  . BPH (benign prostatic hyperplasia) 11/25/2015  . Sprain and strain of metatarsophalangeal joint 04/02/2015  . Erectile dysfunction   . Diabetes mellitus ([place])   . Hyperlipidemia associated with type 2 diabetes mellitus ([place]) 05/25/2012  . HTN (hypertension) 05/25/2012    APPLEGATE, [place] MPT 02/06/2018, 10:56 AM  [facility] [place], [place], [address] Phone: [phone]   Fax:  [phone]  Name: RITCHIE KLEE MRN: [id number] Date of Birth: January 05, 1951

## 2018-02-08 ENCOUNTER — Encounter: Payer: Self-pay | Admitting: Physical Therapy

## 2018-02-08 ENCOUNTER — Ambulatory Visit: Payer: Medicare Other | Admitting: Physical Therapy

## 2018-02-08 DIAGNOSIS — M6281 Muscle weakness (generalized): Secondary | ICD-10-CM

## 2018-02-08 DIAGNOSIS — M25611 Stiffness of right shoulder, not elsewhere classified: Secondary | ICD-10-CM

## 2018-02-08 DIAGNOSIS — M25511 Pain in right shoulder: Secondary | ICD-10-CM

## 2018-02-08 NOTE — Therapy (Signed)
[facility] [place], [place], [address] Phone: [phone]   Fax:  [phone]  Physical Therapy Treatment  Patient Details  Name: Tony Hodges MRN: [id number] Date of Birth: 11-23-1950 Referring Provider (PT): Sydnee Cabal, MD   Encounter Date: 02/08/2018  PT End of Session - 02/08/18 1332    Visit Number  3    Number of Visits  12    Date for PT Re-Evaluation  03/09/18    Authorization Type  FOTO; progress note every 10th visit    PT Start Time  1302    PT Stop Time  1345    PT Time Calculation (min)  43 min    Activity Tolerance  Patient tolerated treatment well    Behavior During Therapy  [facility] for tasks assessed/performed       Past Medical History:  Diagnosis Date  . Diabetes mellitus   . Erectile dysfunction   . Hyperlipidemia   . Hypertension     Past Surgical History:  Procedure Laterality Date  . UMBILICAL HERNIA REPAIR  2010    There were no vitals filed for this visit.  Subjective Assessment - 02/08/18 1302    Subjective  Patient arrived with good progress thus far    Pertinent History  HTN, DM, R RTC 01/24/2018    Limitations  Lifting;House hold activities    Diagnostic tests  MRI, X-Ray    Patient Stated Goals  improve movement, play golf    Currently in Pain?  Yes    Pain Score  3     Pain Location  Shoulder    Pain Orientation  Right    Pain Descriptors / Indicators  Aching    Pain Type  Surgical pain    Pain Onset  1 to 4 weeks ago    Pain Frequency  Intermittent    Aggravating Factors   moveing shoulder    Pain Relieving Factors  rest and meds         [facility] PT Assessment - 02/08/18 0001      PROM   PROM Assessment Site  Shoulder    Right/Left Shoulder  Right    Right Shoulder External Rotation  12 Degrees                   [name] - 02/08/18 0001      Electrical Stimulation   Electrical Stimulation Location  right shoulder    Electrical Stimulation Action  IFC     Electrical Stimulation Parameters  80-150hz  x61min    Electrical Stimulation Goals  Pain      Vasopneumatic   Number Minutes Vasopneumatic   15 minutes    Vasopnuematic Location   Shoulder    Vasopneumatic Pressure  Low      Manual Therapy   Manual Therapy  Passive ROM    Passive ROM  manual PROM to right shoulder flor flexion and ER with gentle range to improve mobilty               PT Short Term Goals - 02/02/18 1245      PT SHORT TERM GOAL #1   Title  STG=LTG        PT Long Term Goals - 02/02/18 1245      PT LONG TERM GOAL #1   Title  Patient will be independent with HEP and its progression    Time  4    Period  Weeks  Status  New      PT LONG TERM GOAL #2   Title  Patient will demonstrate 150+ degrees of right shoulder flexion AROM to improve ability to perform functional tasks.    Time  4    Period  Weeks    Status  New      PT LONG TERM GOAL #3   Title  Patient will demonstrate 60+ degrees of right shoulder external rotation AROM to improve ability to don/doff apparel.    Time  4    Period  Weeks    Status  New      PT LONG TERM GOAL #4   Title  Patient will demonstrate 4/5 or greater right shoulder MMT to improve stability during functional tasks.    Time  4    Period  Weeks    Status  New      PT LONG TERM GOAL #5   Title  Patient will demonstrate ability to perfrom all ADLs independently with pain less than 3/10 in right shoulder.    Time  4    Period  Weeks    Status  New            Plan - 02/08/18 1333    Clinical Impression Statement  Patient tolerated treatment well today. Patient reported no increased discomfort just guarding with PROM and required ossilations to relax. Patient continues to wear sling in clinic and when he sleeps per reported. Patient has improved with PROM for flexion and ER today. Patient progressing toward goals.     Rehab Potential  Excellent    PT Frequency  3x / week     PT Duration  4 weeks    PT Treatment/Interventions  Cryotherapy;Ultrasound;Moist Heat;ADLs/Self Care Home Management;Electrical Stimulation;Iontophoresis 4mg /ml Dexamethasone;Functional mobility training;Neuromuscular re-education;Therapeutic exercise;Therapeutic activities;Patient/family education;Manual techniques;Passive range of motion;Scar mobilization;Vasopneumatic Device    PT Next Visit Plan  cont with POC for PROM to right, see protocol; modalities for pain relief MD follow up 03/01/18    Consulted and Agree with Plan of Care  Patient       Patient will benefit from skilled therapeutic intervention in order to improve the following deficits and impairments:  Pain, Decreased strength, Decreased range of motion, Impaired UE functional use  Visit Diagnosis: Acute pain of right shoulder  Stiffness of right shoulder, not elsewhere classified  Muscle weakness (generalized)     Problem List Patient Active Problem List   Diagnosis Date Noted  . BPH (benign prostatic hyperplasia) 11/25/2015  . Sprain and strain of metatarsophalangeal joint 04/02/2015  . Erectile dysfunction   . Diabetes mellitus ([place])   . Hyperlipidemia associated with type 2 diabetes mellitus ([place]) 05/25/2012  . HTN (hypertension) 05/25/2012    ,  P, PTA 02/08/2018, 1:48 PM  [facility] [address] [place], [place], [address] Phone: [phone]   Fax:  [phone]  Name: LISLE SKILLMAN MRN: [id number] Date of Birth: [birth year]

## 2018-02-10 ENCOUNTER — Ambulatory Visit: Payer: Medicare Other | Admitting: Physical Therapy

## 2018-02-10 ENCOUNTER — Encounter: Payer: Self-pay | Admitting: Physical Therapy

## 2018-02-10 DIAGNOSIS — M25511 Pain in right shoulder: Secondary | ICD-10-CM | POA: Diagnosis not present

## 2018-02-10 DIAGNOSIS — M25611 Stiffness of right shoulder, not elsewhere classified: Secondary | ICD-10-CM

## 2018-02-10 DIAGNOSIS — M6281 Muscle weakness (generalized): Secondary | ICD-10-CM

## 2018-02-10 NOTE — Therapy (Signed)
[facility] [place], [place], [address] Phone: [phone]   Fax:  [phone]  Physical Therapy Treatment  Patient Details  Name: Tony Hodges MRN: [id number] Date of Birth: [birth year] Referring Provider (PT): Sydnee Cabal, MD   Encounter Date: 02/10/2018  PT End of Session - 02/10/18 0948    Visit Number  4    Number of Visits  12    Date for PT Re-Evaluation  03/09/18    Authorization Type  FOTO; progress note every 10th visit    PT Start Time  0948    PT Stop Time  1033    PT Time Calculation (min)  45 min    Activity Tolerance  Patient tolerated treatment well    Behavior During Therapy  [facility] for tasks assessed/performed       Past Medical History:  Diagnosis Date  . Diabetes mellitus   . Erectile dysfunction   . Hyperlipidemia   . Hypertension     Past Surgical History:  Procedure Laterality Date  . UMBILICAL HERNIA REPAIR  2010    There were no vitals filed for this visit.  Subjective Assessment - 02/10/18 0947    Subjective  Patient reported feeling good.    Pertinent History  HTN, DM, R RTC 01/24/2018    Limitations  Lifting;House hold activities    Diagnostic tests  MRI, X-Ray    Patient Stated Goals  improve movement, play golf    Currently in Pain?  Yes    Pain Score  2     Pain Location  Shoulder    Pain Orientation  Right    Pain Descriptors / Indicators  Discomfort    Pain Type  Surgical pain    Pain Onset  1 to 4 weeks ago    Pain Frequency  Intermittent         [facility] PT Assessment - 02/10/18 0001      Assessment   Medical Diagnosis  right shoulder scope SAD/DCR/biceps tenodesis RTC repair    Referring Provider (PT)  Sydnee Cabal, MD    Onset Date/Surgical Date  01/24/18    Hand Dominance  Left    Next MD Visit  03/01/2018    Prior Therapy  no      Precautions   Precautions  Shoulder    Type of Shoulder Precautions  RTC repair; see media for RTC repair [place] [facility]  protocol    Shoulder Interventions  Shoulder sling/immobilizer                   [name] - 02/10/18 0001      Modalities   Modalities  Electrical Stimulation;Vasopneumatic      Electrical Stimulation   Electrical Stimulation Location  R shoulder    Electrical Stimulation Action  IFC    Electrical Stimulation Parameters  80-150 hz x15 min    Electrical Stimulation Goals  Pain      Vasopneumatic   Number Minutes Vasopneumatic   15 minutes    Vasopnuematic Location   Shoulder    Vasopneumatic Pressure  Low    Vasopneumatic Temperature   52      Manual Therapy   Manual Therapy  Passive ROM    Passive ROM  PROM of R shoulder into flex, ER, IR with gentle holds at end range with intermittant oscillations provided for relaxation               PT Short Term Goals -  02/02/18 1245      PT SHORT TERM GOAL #1   Title  STG=LTG        PT Long Term Goals - 02/02/18 1245      PT LONG TERM GOAL #1   Title  Patient will be independent with HEP and its progression    Time  4    Period  Weeks    Status  New      PT LONG TERM GOAL #2   Title  Patient will demonstrate 150+ degrees of right shoulder flexion AROM to improve ability to perform functional tasks.    Time  4    Period  Weeks    Status  New      PT LONG TERM GOAL #3   Title  Patient will demonstrate 60+ degrees of right shoulder external rotation AROM to improve ability to don/doff apparel.    Time  4    Period  Weeks    Status  New      PT LONG TERM GOAL #4   Title  Patient will demonstrate 4/5 or greater right shoulder MMT to improve stability during functional tasks.    Time  4    Period  Weeks    Status  New      PT LONG TERM GOAL #5   Title  Patient will demonstrate ability to perfrom all ADLs independently with pain less than 3/10 in right shoulder.    Time  4    Period  Weeks    Status  New            Plan - 02/10/18 1022    Clinical Impression  Statement  Patient presented in clinic with sling donned and reports of compliance with HEP. No bandaids observed over incisions and patient reports that eccyhmosis clearing up. Patient intermittantly required oscillations of RUE secondary to discomfort and promote relaxation. Patient able to tolerate PROM fairly well and firm end feels into flexion, ER, IR. Normal modalities response noted following removal of the modalities.    Rehab Potential  Excellent    PT Frequency  3x / week    PT Duration  4 weeks    PT Treatment/Interventions  Cryotherapy;Ultrasound;Moist Heat;ADLs/Self Care Home Management;Electrical Stimulation;Iontophoresis 4mg /ml Dexamethasone;Functional mobility training;Neuromuscular re-education;Therapeutic exercise;Therapeutic activities;Patient/family education;Manual techniques;Passive range of motion;Scar mobilization;Vasopneumatic Device    PT Next Visit Plan  cont with POC for PROM to right, see protocol; modalities for pain relief MD follow up 03/01/18    PT Home Exercise Plan  see patient education section    Consulted and Agree with Plan of Care  Patient       Patient will benefit from skilled therapeutic intervention in order to improve the following deficits and impairments:  Pain, Decreased strength, Decreased range of motion, Impaired UE functional use  Visit Diagnosis: Acute pain of right shoulder  Stiffness of right shoulder, not elsewhere classified  Muscle weakness (generalized)     Problem List Patient Active Problem List   Diagnosis Date Noted  . BPH (benign prostatic hyperplasia) 11/25/2015  . Sprain and strain of metatarsophalangeal joint 04/02/2015  . Erectile dysfunction   . Diabetes mellitus ([place])   . Hyperlipidemia associated with type 2 diabetes mellitus ([place]) 05/25/2012  . HTN (hypertension) 05/25/2012    Standley Brooking, PTA 02/10/2018, 12:16 PM  [facility] [address] [place], [place],  [address] Phone: [phone]   Fax:  [phone]  Name: Tony Hodges MRN: [id number] Date  of Birth: 23-Oct-1950

## 2018-02-13 ENCOUNTER — Ambulatory Visit: Payer: Medicare Other | Attending: Specialist | Admitting: Physical Therapy

## 2018-02-13 ENCOUNTER — Encounter: Payer: Self-pay | Admitting: Physical Therapy

## 2018-02-13 DIAGNOSIS — M25611 Stiffness of right shoulder, not elsewhere classified: Secondary | ICD-10-CM | POA: Insufficient documentation

## 2018-02-13 DIAGNOSIS — M6281 Muscle weakness (generalized): Secondary | ICD-10-CM | POA: Diagnosis not present

## 2018-02-13 DIAGNOSIS — M25511 Pain in right shoulder: Secondary | ICD-10-CM | POA: Insufficient documentation

## 2018-02-13 NOTE — Therapy (Signed)
[facility] [place], [place], [address] Phone: [phone]   Fax:  [phone]  Physical Therapy Treatment  Patient Details  Name: Tony Hodges MRN: [id number] Date of Birth: 12/28/1950 Referring Provider (PT): Sydnee Cabal, MD   Encounter Date: 02/13/2018  PT End of Session - 02/13/18 1022    Visit Number  5    Number of Visits  12    Date for PT Re-Evaluation  03/09/18    Authorization Type  FOTO; progress note every 10th visit    PT Start Time  0949    PT Stop Time  1036    PT Time Calculation (min)  47 min    Activity Tolerance  Patient tolerated treatment well    Behavior During Therapy  [facility] for tasks assessed/performed       Past Medical History:  Diagnosis Date  . Diabetes mellitus   . Erectile dysfunction   . Hyperlipidemia   . Hypertension     Past Surgical History:  Procedure Laterality Date  . UMBILICAL HERNIA REPAIR  2010    There were no vitals filed for this visit.  Subjective Assessment - 02/13/18 0953    Subjective  No new complaints just ongoing tightness    Pertinent History  HTN, DM, R RTC 01/24/2018    Limitations  Lifting;House hold activities    Diagnostic tests  MRI, X-Ray    Patient Stated Goals  improve movement, play golf    Currently in Pain?  Yes    Pain Score  2     Pain Location  Shoulder    Pain Orientation  Right    Pain Descriptors / Indicators  Discomfort;Tightness    Pain Type  Surgical pain    Pain Onset  1 to 4 weeks ago    Pain Frequency  Intermittent    Aggravating Factors   moving shoulder    Pain Relieving Factors  rest                       [name] - 02/13/18 0001      Electrical Stimulation   Electrical Stimulation Location  R shoulder    Electrical Stimulation Action  IFC    Electrical Stimulation Parameters  80-150hz  x66min    Electrical Stimulation Goals  Pain      Vasopneumatic   Number Minutes  Vasopneumatic   15 minutes    Vasopnuematic Location   Shoulder    Vasopneumatic Pressure  Low      Manual Therapy   Manual Therapy  Passive ROM    Passive ROM  PROM of R shoulder into flex, ER, IR with gentle holds at end range with intermittant oscillations provided for relaxation               PT Short Term Goals - 02/02/18 1245      PT SHORT TERM GOAL #1   Title  STG=LTG        PT Long Term Goals - 02/02/18 1245      PT LONG TERM GOAL #1   Title  Patient will be independent with HEP and its progression    Time  4    Period  Weeks    Status  New      PT LONG TERM GOAL #2   Title  Patient will demonstrate 150+ degrees of right shoulder flexion AROM to improve ability to perform functional tasks.    Time  4    Period  Weeks    Status  New      PT LONG TERM GOAL #3   Title  Patient will demonstrate 60+ degrees of right shoulder external rotation AROM to improve ability to don/doff apparel.    Time  4    Period  Weeks    Status  New      PT LONG TERM GOAL #4   Title  Patient will demonstrate 4/5 or greater right shoulder MMT to improve stability during functional tasks.    Time  4    Period  Weeks    Status  New      PT LONG TERM GOAL #5   Title  Patient will demonstrate ability to perfrom all ADLs independently with pain less than 3/10 in right shoulder.    Time  4    Period  Weeks    Status  New            Plan - 02/13/18 1023    Clinical Impression Statement  Patient tolerated treatment well today with tightness reported upon arrival. Patient required ossilations with PROM to help relax due to guarding and performed PROM for flexion, ER and IR with holds end range to improve mobility. proximal stabilization performed with ER to help reduce pain. ROM progressing and goals ongoing.     Rehab Potential  Excellent    PT Frequency  3x / week    PT Duration  4 weeks    PT Treatment/Interventions  Cryotherapy;Ultrasound;Moist Heat;ADLs/Self Care Home  Management;Electrical Stimulation;Iontophoresis 4mg /ml Dexamethasone;Functional mobility training;Neuromuscular re-education;Therapeutic exercise;Therapeutic activities;Patient/family education;Manual techniques;Passive range of motion;Scar mobilization;Vasopneumatic Device    PT Next Visit Plan  cont with POC for PROM to right, see protocol; modalities for pain relief MD follow up 03/01/18    Consulted and Agree with Plan of Care  Patient       Patient will benefit from skilled therapeutic intervention in order to improve the following deficits and impairments:  Pain, Decreased strength, Decreased range of motion, Impaired UE functional use  Visit Diagnosis: Acute pain of right shoulder  Stiffness of right shoulder, not elsewhere classified  Muscle weakness (generalized)     Problem List Patient Active Problem List   Diagnosis Date Noted  . BPH (benign prostatic hyperplasia) 11/25/2015  . Sprain and strain of metatarsophalangeal joint 04/02/2015  . Erectile dysfunction   . Diabetes mellitus ([place])   . Hyperlipidemia associated with type 2 diabetes mellitus ([place]) 05/25/2012  . HTN (hypertension) 05/25/2012    Novali Vollman P, PTA 02/13/2018, 10:38 AM  [facility] [place], [place], [address] Phone: [phone]   Fax:  [phone]  Name: SEDRICK TOBER MRN: [id number] Date of Birth: 1950/07/19

## 2018-02-15 ENCOUNTER — Ambulatory Visit: Payer: Medicare Other | Admitting: Physical Therapy

## 2018-02-15 ENCOUNTER — Encounter: Payer: Self-pay | Admitting: Physical Therapy

## 2018-02-15 DIAGNOSIS — M25511 Pain in right shoulder: Secondary | ICD-10-CM

## 2018-02-15 DIAGNOSIS — M6281 Muscle weakness (generalized): Secondary | ICD-10-CM | POA: Diagnosis not present

## 2018-02-15 DIAGNOSIS — M25611 Stiffness of right shoulder, not elsewhere classified: Secondary | ICD-10-CM

## 2018-02-15 NOTE — Therapy (Signed)
[facility] [place], [place], [address] Phone: [phone]   Fax:  [phone]  Physical Therapy Treatment  Patient Details  Name: Tony Hodges MRN: [id number] Date of Birth: 02/08/1950 Referring Provider (PT): Sydnee Cabal, MD   Encounter Date: 02/15/2018  PT End of Session - 02/15/18 1334    Visit Number  6    Number of Visits  12    Date for PT Re-Evaluation  03/09/18    Authorization Type  FOTO; progress note every 10th visit    PT Start Time  1301    PT Stop Time  1344    PT Time Calculation (min)  43 min    Activity Tolerance  Patient tolerated treatment well    Behavior During Therapy  [facility] for tasks assessed/performed       Past Medical History:  Diagnosis Date  . Diabetes mellitus   . Erectile dysfunction   . Hyperlipidemia   . Hypertension     Past Surgical History:  Procedure Laterality Date  . UMBILICAL HERNIA REPAIR  2010    There were no vitals filed for this visit.  Subjective Assessment - 02/15/18 1314    Subjective  doing well with ongoing tightness upon arrival    Pertinent History  HTN, DM, R RTC 01/24/2018    Limitations  Lifting;House hold activities    Diagnostic tests  MRI, X-Ray    Patient Stated Goals  improve movement, play golf    Currently in Pain?  Yes    Pain Score  2     Pain Location  Shoulder    Pain Orientation  Right    Pain Descriptors / Indicators  Tightness    Pain Type  Surgical pain    Pain Onset  1 to 4 weeks ago    Pain Frequency  Intermittent    Aggravating Factors   movement    Pain Relieving Factors  rest         [facility] PT Assessment - 02/15/18 0001      PROM   PROM Assessment Site  Shoulder    Right/Left Shoulder  Right    Right Shoulder Flexion  104 Degrees    Right Shoulder External Rotation  18 Degrees                   [name] - 02/15/18 0001      Electrical Stimulation   Electrical Stimulation  Location  R shoulder    Electrical Stimulation Action  IFC    Electrical Stimulation Parameters  80-150hz  x43min    Electrical Stimulation Goals  Pain      Vasopneumatic   Number Minutes Vasopneumatic   15 minutes    Vasopnuematic Location   Shoulder    Vasopneumatic Pressure  Low      Manual Therapy   Manual Therapy  Passive ROM    Passive ROM  PROM of R shoulder into flex, ER, IR with gentle holds at end range with intermittant oscillations provided for relaxation               PT Short Term Goals - 02/02/18 1245      PT SHORT TERM GOAL #1   Title  STG=LTG        PT Long Term Goals - 02/15/18 1339      PT LONG TERM GOAL #1   Title  Patient will be independent with HEP and its progression    Time  4    Period  Weeks    Status  On-going      PT LONG TERM GOAL #2   Title  Patient will demonstrate 150+ degrees of right shoulder flexion AROM to improve ability to perform functional tasks.    Time  4    Period  Weeks    Status  On-going      PT LONG TERM GOAL #3   Title  Patient will demonstrate 60+ degrees of right shoulder external rotation AROM to improve ability to don/doff apparel.    Time  4    Period  Weeks    Status  On-going      PT LONG TERM GOAL #4   Title  Patient will demonstrate 4/5 or greater right shoulder MMT to improve stability during functional tasks.    Time  4    Period  Weeks    Status  On-going      PT LONG TERM GOAL #5   Title  Patient will demonstrate ability to perfrom all ADLs independently with pain less than 3/10 in right shoulder.    Time  4    Period  Weeks    Status  On-going            Plan - 02/15/18 1336    Clinical Impression Statement  Patient tolerated treatment well today. Patient continues to arrive with tightness in shoulder yet loosens up after PROM with proximal stabilization for ER. Patient has improved PROM today and progressing toward goals per protocol limitations.     Rehab Potential  Excellent     PT Frequency  3x / week    PT Duration  4 weeks    PT Treatment/Interventions  Cryotherapy;Ultrasound;Moist Heat;ADLs/Self Care Home Management;Electrical Stimulation;Iontophoresis 4mg /ml Dexamethasone;Functional mobility training;Neuromuscular re-education;Therapeutic exercise;Therapeutic activities;Patient/family education;Manual techniques;Passive range of motion;Scar mobilization;Vasopneumatic Device    PT Next Visit Plan  cont with POC for PROM to right, see protocol; modalities for pain relief MD follow up 03/01/18    Consulted and Agree with Plan of Care  Patient       Patient will benefit from skilled therapeutic intervention in order to improve the following deficits and impairments:  Pain, Decreased strength, Decreased range of motion, Impaired UE functional use  Visit Diagnosis: Acute pain of right shoulder  Stiffness of right shoulder, not elsewhere classified  Muscle weakness (generalized)     Problem List Patient Active Problem List   Diagnosis Date Noted  . BPH (benign prostatic hyperplasia) 11/25/2015  . Sprain and strain of metatarsophalangeal joint 04/02/2015  . Erectile dysfunction   . Diabetes mellitus ([place])   . Hyperlipidemia associated with type 2 diabetes mellitus ([place]) 05/25/2012  . HTN (hypertension) 05/25/2012    Elainna Eshleman P, PTA 02/15/2018, 1:50 PM  [facility] [address] [place], [place], [address] Phone: [phone]   Fax:  [phone]  Name: Tony Hodges MRN: [id number] Date of Birth: [birth year]

## 2018-02-17 ENCOUNTER — Ambulatory Visit: Payer: Medicare Other | Admitting: Physical Therapy

## 2018-02-17 ENCOUNTER — Encounter: Payer: Self-pay | Admitting: Physical Therapy

## 2018-02-17 DIAGNOSIS — M25611 Stiffness of right shoulder, not elsewhere classified: Secondary | ICD-10-CM

## 2018-02-17 DIAGNOSIS — M6281 Muscle weakness (generalized): Secondary | ICD-10-CM | POA: Diagnosis not present

## 2018-02-17 DIAGNOSIS — M25511 Pain in right shoulder: Secondary | ICD-10-CM | POA: Diagnosis not present

## 2018-02-17 NOTE — Therapy (Addendum)
[facility] [place], [place], [address] Phone: [phone]   Fax:  [phone]  Physical Therapy Treatment  Patient Details  Name: Tony Hodges MRN: [id number] Date of Birth: [birth year] Referring Provider (PT): Sydnee Cabal, MD   Encounter Date: 02/17/2018    Past Medical History:  Diagnosis Date  . Diabetes mellitus   . Erectile dysfunction   . Hyperlipidemia   . Hypertension     Past Surgical History:  Procedure Laterality Date  . UMBILICAL HERNIA REPAIR  2010    There were no vitals filed for this visit.                              PT Short Term Goals - 02/02/18 1245      PT SHORT TERM GOAL #1   Title  STG=LTG        PT Long Term Goals - 02/15/18 1339      PT LONG TERM GOAL #1   Title  Patient will be independent with HEP and its progression    Time  4    Period  Weeks    Status  On-going      PT LONG TERM GOAL #2   Title  Patient will demonstrate 150+ degrees of right shoulder flexion AROM to improve ability to perform functional tasks.    Time  4    Period  Weeks    Status  On-going      PT LONG TERM GOAL #3   Title  Patient will demonstrate 60+ degrees of right shoulder external rotation AROM to improve ability to don/doff apparel.    Time  4    Period  Weeks    Status  On-going      PT LONG TERM GOAL #4   Title  Patient will demonstrate 4/5 or greater right shoulder MMT to improve stability during functional tasks.    Time  4    Period  Weeks    Status  On-going      PT LONG TERM GOAL #5   Title  Patient will demonstrate ability to perfrom all ADLs independently with pain less than 3/10 in right shoulder.    Time  4    Period  Weeks    Status  On-going              Patient will benefit from skilled therapeutic intervention in order to improve the following deficits and impairments:  Pain, Decreased strength, Decreased range of motion,  Impaired UE functional use  Visit Diagnosis: Acute pain of right shoulder  Stiffness of right shoulder, not elsewhere classified  Muscle weakness (generalized)     Problem List Patient Active Problem List   Diagnosis Date Noted  . BPH (benign prostatic hyperplasia) 11/25/2015  . Sprain and strain of metatarsophalangeal joint 04/02/2015  . Erectile dysfunction   . Diabetes mellitus ([place])   . Hyperlipidemia associated with type 2 diabetes mellitus ([place]) 05/25/2012  . HTN (hypertension) 05/25/2012    Standley Brooking, PTA 02/20/2018, 1:08 PM  [facility] [address] [place], [place], [address] Phone: [phone]   Fax:  [phone]  Name: DAIMION ADAMCIK MRN: [id number] Date of Birth: [birth year]

## 2018-02-20 ENCOUNTER — Ambulatory Visit: Payer: Medicare Other | Admitting: Physical Therapy

## 2018-02-20 DIAGNOSIS — M25611 Stiffness of right shoulder, not elsewhere classified: Secondary | ICD-10-CM

## 2018-02-20 DIAGNOSIS — M6281 Muscle weakness (generalized): Secondary | ICD-10-CM

## 2018-02-20 DIAGNOSIS — M25511 Pain in right shoulder: Secondary | ICD-10-CM

## 2018-02-20 NOTE — Therapy (Signed)
[facility] [place], [place], [address] Phone: [phone]   Fax:  [phone]  Physical Therapy Treatment  Patient Details  Name: Tony Hodges MRN: [id number] Date of Birth: 10-Jan-1951 Referring Provider (PT): Sydnee Cabal, MD   Encounter Date: 02/20/2018  PT End of Session - 02/20/18 1041    Visit Number  8    Number of Visits  12    Date for PT Re-Evaluation  03/09/18    Authorization Type  FOTO; progress note every 10th visit    PT Start Time  0948    PT Stop Time  1040    PT Time Calculation (min)  52 min    Activity Tolerance  Patient tolerated treatment well    Behavior During Therapy  [facility] for tasks assessed/performed       Past Medical History:  Diagnosis Date  . Diabetes mellitus   . Erectile dysfunction   . Hyperlipidemia   . Hypertension     Past Surgical History:  Procedure Laterality Date  . UMBILICAL HERNIA REPAIR  2010    There were no vitals filed for this visit.  Subjective Assessment - 02/20/18 1043    Subjective  Patient reported ongoing tightness in right shoulder, he utilizes his home TENs unit and ices throughout the day.     Pertinent History  HTN, DM, R RTC 01/24/2018    Limitations  Lifting;House hold activities    Diagnostic tests  MRI, X-Ray    Patient Stated Goals  improve movement, play golf    Currently in Pain?  Yes    Pain Score  2     Pain Location  Shoulder    Pain Orientation  Right    Pain Descriptors / Indicators  Tightness    Pain Type  Surgical pain    Pain Onset  1 to 4 weeks ago    Pain Frequency  Intermittent         [facility] PT Assessment - 02/20/18 0001      Assessment   Medical Diagnosis  right shoulder scope SAD/DCR/biceps tenodesis RTC repair    Referring Provider (PT)  Sydnee Cabal, MD    Onset Date/Surgical Date  01/24/18    Hand Dominance  Left    Next MD Visit  03/01/2018      Precautions   Precautions  Shoulder    Type of Shoulder  Precautions  RTC repair; see media for RTC repair [place] orthopedics protocol    Shoulder Interventions  Shoulder sling/immobilizer      PROM   Right Shoulder Flexion  128 Degrees    Right Shoulder External Rotation  25 Degrees                   [name] - 02/20/18 0001      Electrical Stimulation   Electrical Stimulation Location  R shoulder    Electrical Stimulation Action  IFC    Electrical Stimulation Parameters  80-150 hz x15 mins    Electrical Stimulation Goals  Pain      Vasopneumatic   Number Minutes Vasopneumatic   15 minutes    Vasopnuematic Location   Shoulder    Vasopneumatic Pressure  Low    Vasopneumatic Temperature   52      Manual Therapy   Manual Therapy  Passive ROM    Passive ROM  PROM of R shoulder into flex, ER, IR with gentle holds at end range with intermittant oscillations provided  for relaxation               PT Short Term Goals - 02/02/18 1245      PT SHORT TERM GOAL #1   Title  STG=LTG        PT Long Term Goals - 02/15/18 1339      PT LONG TERM GOAL #1   Title  Patient will be independent with HEP and its progression    Time  4    Period  Weeks    Status  On-going      PT LONG TERM GOAL #2   Title  Patient will demonstrate 150+ degrees of right shoulder flexion AROM to improve ability to perform functional tasks.    Time  4    Period  Weeks    Status  On-going      PT LONG TERM GOAL #3   Title  Patient will demonstrate 60+ degrees of right shoulder external rotation AROM to improve ability to don/doff apparel.    Time  4    Period  Weeks    Status  On-going      PT LONG TERM GOAL #4   Title  Patient will demonstrate 4/5 or greater right shoulder MMT to improve stability during functional tasks.    Time  4    Period  Weeks    Status  On-going      PT LONG TERM GOAL #5   Title  Patient will demonstrate ability to perfrom all ADLs independently with pain less than 3/10 in right shoulder.     Time  4    Period  Weeks    Status  On-going            Plan - 02/20/18 1041    Clinical Impression Statement  Patient was able to tolerate well today with no reports of increased pain at end range, just tightness. Patient provided with intermittent oscillations to promote muscle relaxation. Patient noted with improved PROM to 25 degrees in ER and 128 degrees in PROM right shoulder flexion. Normal response to modalities upon removal.     Clinical Presentation  Stable    Clinical Decision Making  Low    Rehab Potential  Excellent    PT Frequency  3x / week    PT Duration  4 weeks    PT Treatment/Interventions  Cryotherapy;Ultrasound;Moist Heat;ADLs/Self Care Home Management;Electrical Stimulation;Iontophoresis 4mg /ml Dexamethasone;Functional mobility training;Neuromuscular re-education;Therapeutic exercise;Therapeutic activities;Patient/family education;Manual techniques;Passive range of motion;Scar mobilization;Vasopneumatic Device    PT Next Visit Plan  cont with POC for PROM to right, see protocol; modalities for pain relief MD follow up 03/01/18    Consulted and Agree with Plan of Care  Patient       Patient will benefit from skilled therapeutic intervention in order to improve the following deficits and impairments:  Pain, Decreased strength, Decreased range of motion, Impaired UE functional use  Visit Diagnosis: Acute pain of right shoulder  Stiffness of right shoulder, not elsewhere classified  Muscle weakness (generalized)     Problem List Patient Active Problem List   Diagnosis Date Noted  . BPH (benign prostatic hyperplasia) 11/25/2015  . Sprain and strain of metatarsophalangeal joint 04/02/2015  . Erectile dysfunction   . Diabetes mellitus ([place])   . Hyperlipidemia associated with type 2 diabetes mellitus ([place]) 05/25/2012  . HTN (hypertension) 05/25/2012    [name], PT, DPT 02/20/2018, 12:47 PM  [facility] [address] [place], [place], [address]  Phone: [phone]   Fax:  [phone]  Name: Tony Hodges MRN: [id number] Date of Birth: [birth year]

## 2018-02-22 ENCOUNTER — Ambulatory Visit: Payer: Medicare Other | Admitting: Physical Therapy

## 2018-02-22 ENCOUNTER — Encounter: Payer: Self-pay | Admitting: Physical Therapy

## 2018-02-22 DIAGNOSIS — M25511 Pain in right shoulder: Secondary | ICD-10-CM | POA: Diagnosis not present

## 2018-02-22 DIAGNOSIS — M6281 Muscle weakness (generalized): Secondary | ICD-10-CM | POA: Diagnosis not present

## 2018-02-22 DIAGNOSIS — M25611 Stiffness of right shoulder, not elsewhere classified: Secondary | ICD-10-CM

## 2018-02-22 NOTE — Therapy (Signed)
[facility] [place], [place], [address] Phone: [phone]   Fax:  [phone]  Physical Therapy Treatment  Patient Details  Name: Tony Hodges MRN: [id number] Date of Birth: [birth year] Referring Provider (PT): Sydnee Cabal, MD   Encounter Date: 02/22/2018  PT End of Session - 02/22/18 1331    Visit Number  9    Number of Visits  12    Date for PT Re-Evaluation  03/09/18    Authorization Type  FOTO; progress note every 10th visit    PT Start Time  1301    PT Stop Time  1344    PT Time Calculation (min)  43 min    Activity Tolerance  Patient tolerated treatment well    Behavior During Therapy  [facility] for tasks assessed/performed       Past Medical History:  Diagnosis Date  . Diabetes mellitus   . Erectile dysfunction   . Hyperlipidemia   . Hypertension     Past Surgical History:  Procedure Laterality Date  . UMBILICAL HERNIA REPAIR  2010    There were no vitals filed for this visit.  Subjective Assessment - 02/22/18 1304    Subjective  Patient reported doing well with no new complaints    Pertinent History  HTN, DM, R RTC 01/24/2018    Limitations  Lifting;House hold activities    Diagnostic tests  MRI, X-Ray    Patient Stated Goals  improve movement, play golf    Currently in Pain?  Yes    Pain Score  2     Pain Location  Shoulder    Pain Orientation  Right    Pain Descriptors / Indicators  Tightness    Pain Type  Surgical pain    Pain Onset  1 to 4 weeks ago    Pain Frequency  Intermittent    Aggravating Factors   movement    Pain Relieving Factors  rest         [facility] PT Assessment - 02/22/18 0001      PROM   Right Shoulder External Rotation  30 Degrees                   [name] - 02/22/18 0001      Electrical Stimulation   Electrical Stimulation Location  R shoulder    Electrical Stimulation Action  IFC    Electrical Stimulation Parameters   80-150hz  x72min    Electrical Stimulation Goals  Pain      Vasopneumatic   Number Minutes Vasopneumatic   15 minutes    Vasopnuematic Location   Shoulder    Vasopneumatic Pressure  Low      Manual Therapy   Manual Therapy  Passive ROM    Passive ROM  PROM of R shoulder into flex, ER, IR with gentle holds at end range with intermittant oscillations provided for relaxation               PT Short Term Goals - 02/02/18 1245      PT SHORT TERM GOAL #1   Title  STG=LTG        PT Long Term Goals - 02/15/18 1339      PT LONG TERM GOAL #1   Title  Patient will be independent with HEP and its progression    Time  4    Period  Weeks    Status  On-going      PT LONG TERM GOAL #  2   Title  Patient will demonstrate 150+ degrees of right shoulder flexion AROM to improve ability to perform functional tasks.    Time  4    Period  Weeks    Status  On-going      PT LONG TERM GOAL #3   Title  Patient will demonstrate 60+ degrees of right shoulder external rotation AROM to improve ability to don/doff apparel.    Time  4    Period  Weeks    Status  On-going      PT LONG TERM GOAL #4   Title  Patient will demonstrate 4/5 or greater right shoulder MMT to improve stability during functional tasks.    Time  4    Period  Weeks    Status  On-going      PT LONG TERM GOAL #5   Title  Patient will demonstrate ability to perfrom all ADLs independently with pain less than 3/10 in right shoulder.    Time  4    Period  Weeks    Status  On-going            Plan - 02/22/18 1333    Clinical Impression Statement  Patient tolerated treatment well today. Patient has no increased pain just some ongoing tightness in shoulder. Patient able to improve PROM today. Patient has increased guarding and required ossilations to relax. Goals progressing.     Rehab Potential  Excellent    PT Frequency  3x / week    PT Duration  4 weeks    PT Treatment/Interventions  Cryotherapy;Ultrasound;Moist  Heat;ADLs/Self Care Home Management;Electrical Stimulation;Iontophoresis 4mg /ml Dexamethasone;Functional mobility training;Neuromuscular re-education;Therapeutic exercise;Therapeutic activities;Patient/family education;Manual techniques;Passive range of motion;Scar mobilization;Vasopneumatic Device    PT Next Visit Plan  cont with POC for PROM to right, see protocol; modalities for pain relief MD follow up 03/01/18 with Dr. Patriciaann Clan and Agree with Plan of Care  Patient       Patient will benefit from skilled therapeutic intervention in order to improve the following deficits and impairments:  Pain, Decreased strength, Decreased range of motion, Impaired UE functional use  Visit Diagnosis: Acute pain of right shoulder  Stiffness of right shoulder, not elsewhere classified  Muscle weakness (generalized)     Problem List Patient Active Problem List   Diagnosis Date Noted  . BPH (benign prostatic hyperplasia) 11/25/2015  . Sprain and strain of metatarsophalangeal joint 04/02/2015  . Erectile dysfunction   . Diabetes mellitus ([place])   . Hyperlipidemia associated with type 2 diabetes mellitus ([place]) 05/25/2012  . HTN (hypertension) 05/25/2012    Neve Branscomb P, PTA 02/22/2018, 1:48 PM  [facility] [address] [place], [place], [address] Phone: [phone]   Fax:  [phone]  Name: Tony Hodges MRN: [id number] Date of Birth: [birth year]

## 2018-02-24 ENCOUNTER — Encounter: Payer: Self-pay | Admitting: Physical Therapy

## 2018-02-24 ENCOUNTER — Ambulatory Visit: Payer: Medicare Other | Admitting: Physical Therapy

## 2018-02-24 DIAGNOSIS — M25611 Stiffness of right shoulder, not elsewhere classified: Secondary | ICD-10-CM | POA: Diagnosis not present

## 2018-02-24 DIAGNOSIS — M6281 Muscle weakness (generalized): Secondary | ICD-10-CM | POA: Diagnosis not present

## 2018-02-24 DIAGNOSIS — M25511 Pain in right shoulder: Secondary | ICD-10-CM | POA: Diagnosis not present

## 2018-02-24 NOTE — Therapy (Signed)
[facility] [place], [place], [address] Phone: [phone]   Fax:  [phone]  Physical Therapy Treatment  Progress Note Reporting Period 02/02/2018 to 02/24/2018  See note below for Objective Data and Assessment of Progress/Goals.    Patient Details  Name: Tony Hodges MRN: [id number] Date of Birth: 01-22-1950 Referring Provider (PT): Sydnee Cabal, MD   Encounter Date: 02/24/2018  PT End of Session - 02/24/18 1042    Visit Number  10    Number of Visits  12    Date for PT Re-Evaluation  03/09/18    Authorization Type  FOTO; progress note every 10th visit    PT Start Time  0945    PT Stop Time  1036    PT Time Calculation (min)  51 min    Activity Tolerance  Patient tolerated treatment well    Behavior During Therapy  [name] for tasks assessed/performed       Past Medical History:  Diagnosis Date  . Diabetes mellitus   . Erectile dysfunction   . Hyperlipidemia   . Hypertension     Past Surgical History:  Procedure Laterality Date  . UMBILICAL HERNIA REPAIR  2010    There were no vitals filed for this visit.  Subjective Assessment - 02/24/18 1042    Subjective  Patient reported doing well and is going for a follow up on 03/01/2018     Pertinent History  HTN, DM, R RTC 01/24/2018    Limitations  Lifting;House hold activities    Diagnostic tests  MRI, X-Ray    Patient Stated Goals  improve movement, play golf    Currently in Pain?  Yes    Pain Score  2     Pain Orientation  Right    Pain Descriptors / Indicators  Tightness    Pain Type  Surgical pain    Pain Onset  1 to 4 weeks ago         [name] - 02/24/18 0001      Assessment   Medical Diagnosis  right shoulder scope SAD/DCR/biceps tenodesis RTC repair    Referring Provider (PT)  Sydnee Cabal, MD    Onset Date/Surgical Date  01/24/18    Hand Dominance  Left    Next MD Visit  03/01/2018    Prior Therapy  no      PROM    Overall PROM   Deficits    Right Shoulder Flexion  135 Degrees    Right Shoulder External Rotation  48 Degrees                   [name] - 02/24/18 0001      Exercises   Exercises  Shoulder      Shoulder Exercises: Pulleys   Flexion  3 minutes      Electrical Stimulation   Electrical Stimulation Location  R shoulder    Electrical Stimulation Action  IFC    Electrical Stimulation Parameters  80-150 hz x15 mins    Electrical Stimulation Goals  Pain      Vasopneumatic   Number Minutes Vasopneumatic   15 minutes    Vasopnuematic Location   Shoulder    Vasopneumatic Pressure  Low      Manual Therapy   Manual Therapy  Passive ROM    Passive ROM  PROM of R shoulder into flex, ER, IR with gentle holds at end range with intermittant oscillations provided for relaxation  PT Short Term Goals - 02/02/18 1245      PT SHORT TERM GOAL #1   Title  STG=LTG        PT Long Term Goals - 02/15/18 1339      PT LONG TERM GOAL #1   Title  Patient will be independent with HEP and its progression    Time  4    Period  Weeks    Status  On-going      PT LONG TERM GOAL #2   Title  Patient will demonstrate 150+ degrees of right shoulder flexion AROM to improve ability to perform functional tasks.    Time  4    Period  Weeks    Status  On-going      PT LONG TERM GOAL #3   Title  Patient will demonstrate 60+ degrees of right shoulder external rotation AROM to improve ability to don/doff apparel.    Time  4    Period  Weeks    Status  On-going      PT LONG TERM GOAL #4   Title  Patient will demonstrate 4/5 or greater right shoulder MMT to improve stability during functional tasks.    Time  4    Period  Weeks    Status  On-going      PT LONG TERM GOAL #5   Title  Patient will demonstrate ability to perfrom all ADLs independently with pain less than 3/10 in right shoulder.    Time  4    Period  Weeks    Status  On-going             Plan - 02/24/18 1043    Clinical Impression Statement  Patient was able to tolerate treatment well with minimal increase of pain with PROM. Per protocol, patient able to perfrom AAROM exercises. Patient denied pain with pulleys. PROM improved see objective measures. Goals are ongoing at this time. Normal response to modalities upon removal.     Clinical Presentation  Stable    Clinical Decision Making  Low    Rehab Potential  Excellent    PT Frequency  3x / week    PT Duration  4 weeks    PT Treatment/Interventions  Cryotherapy;Ultrasound;Moist Heat;ADLs/Self Care Home Management;Electrical Stimulation;Iontophoresis 4mg /ml Dexamethasone;Functional mobility training;Neuromuscular re-education;Therapeutic exercise;Therapeutic activities;Patient/family education;Manual techniques;Passive range of motion;Scar mobilization;Vasopneumatic Device    PT Next Visit Plan  cont with POC for PROM to right, see protocol; modalities for pain relief MD follow up 03/01/18 with Dr. Patriciaann Clan and Agree with Plan of Care  Patient       Patient will benefit from skilled therapeutic intervention in order to improve the following deficits and impairments:  Pain, Decreased strength, Decreased range of motion, Impaired UE functional use  Visit Diagnosis: Acute pain of right shoulder  Stiffness of right shoulder, not elsewhere classified  Muscle weakness (generalized)     Problem List Patient Active Problem List   Diagnosis Date Noted  . BPH (benign prostatic hyperplasia) 11/25/2015  . Sprain and strain of metatarsophalangeal joint 04/02/2015  . Erectile dysfunction   . Diabetes mellitus ([place])   . Hyperlipidemia associated with type 2 diabetes mellitus ([place]) 05/25/2012  . HTN (hypertension) 05/25/2012    [name], PT, DPT 02/24/2018, 10:48 AM  [name] [address] [place], [place], [address] Phone: [phone]   Fax:   [phone]  Name: Tony Hodges MRN: [id number] Date of Birth: Feb 10, 1950

## 2018-02-27 ENCOUNTER — Ambulatory Visit: Payer: Medicare Other | Admitting: Physical Therapy

## 2018-02-27 DIAGNOSIS — M25511 Pain in right shoulder: Secondary | ICD-10-CM | POA: Diagnosis not present

## 2018-02-27 DIAGNOSIS — M6281 Muscle weakness (generalized): Secondary | ICD-10-CM

## 2018-02-27 DIAGNOSIS — M25611 Stiffness of right shoulder, not elsewhere classified: Secondary | ICD-10-CM | POA: Diagnosis not present

## 2018-02-27 NOTE — Therapy (Signed)
[facility] [place], [place], [address] Phone: [phone]   Fax:  [phone]  Physical Therapy Treatment  Patient Details  Name: Tony Hodges MRN: [id number] Date of Birth: 11-20-1950 Referring Provider (PT): Sydnee Cabal, MD   Encounter Date: 02/27/2018  PT End of Session - 02/27/18 0949    Visit Number  11    Number of Visits  12    Date for PT Re-Evaluation  03/09/18    Authorization Type  FOTO; progress note every 10th visit    PT Start Time  0947    PT Stop Time  1042    PT Time Calculation (min)  55 min    Activity Tolerance  Patient tolerated treatment well    Behavior During Therapy  [facility] for tasks assessed/performed       Past Medical History:  Diagnosis Date  . Diabetes mellitus   . Erectile dysfunction   . Hyperlipidemia   . Hypertension     Past Surgical History:  Procedure Laterality Date  . UMBILICAL HERNIA REPAIR  2010    There were no vitals filed for this visit.  Subjective Assessment - 02/27/18 0949    Subjective  Reports feeling stiff this morning but doing well.     Pertinent History  HTN, DM, R RTC 01/24/2018    Limitations  Lifting;House hold activities    Diagnostic tests  MRI, X-Ray    Patient Stated Goals  improve movement, play golf    Currently in Pain?  Yes    Pain Score  3     Pain Location  Shoulder    Pain Orientation  Right    Pain Descriptors / Indicators  Tightness    Pain Type  Surgical pain    Pain Onset  1 to 4 weeks ago    Pain Frequency  Intermittent         [facility] PT Assessment - 02/27/18 0001      Assessment   Medical Diagnosis  right shoulder scope SAD/DCR/biceps tenodesis RTC repair    Referring Provider (PT)  Sydnee Cabal, MD    Onset Date/Surgical Date  01/24/18    Hand Dominance  Left    Next MD Visit  03/01/2018    Prior Therapy  no      Precautions   Precautions  Shoulder    Type of Shoulder Precautions  RTC repair; see media for RTC  repair [place] orthopedics protocol                   [name] - 02/27/18 0001      Exercises   Exercises  Shoulder      Electrical Stimulation   Electrical Stimulation Location  R shoulder    Electrical Stimulation Action  IFC    Electrical Stimulation Parameters  80-150 hz x15 min    Electrical Stimulation Goals  Pain      Vasopneumatic   Number Minutes Vasopneumatic   15 minutes    Vasopnuematic Location   Shoulder    Vasopneumatic Pressure  Low      Manual Therapy   Manual Therapy  Passive ROM    Passive ROM  PROM of R shoulder into flex, ER, IR with gentle holds at end range with intermittant oscillations provided for relaxation               PT Short Term Goals - 02/02/18 1245      PT SHORT TERM GOAL #  1   Title  STG=LTG        PT Long Term Goals - 02/15/18 1339      PT LONG TERM GOAL #1   Title  Patient will be independent with HEP and its progression    Time  4    Period  Weeks    Status  On-going      PT LONG TERM GOAL #2   Title  Patient will demonstrate 150+ degrees of right shoulder flexion AROM to improve ability to perform functional tasks.    Time  4    Period  Weeks    Status  On-going      PT LONG TERM GOAL #3   Title  Patient will demonstrate 60+ degrees of right shoulder external rotation AROM to improve ability to don/doff apparel.    Time  4    Period  Weeks    Status  On-going      PT LONG TERM GOAL #4   Title  Patient will demonstrate 4/5 or greater right shoulder MMT to improve stability during functional tasks.    Time  4    Period  Weeks    Status  On-going      PT LONG TERM GOAL #5   Title  Patient will demonstrate ability to perfrom all ADLs independently with pain less than 3/10 in right shoulder.    Time  4    Period  Weeks    Status  On-going            Plan - 02/27/18 1305    Clinical Impression Statement  Patient was able tolerate treatment well with no increase of pain.  Patient noted with smooth PROM with PROM. Normal response to modalities at end of session.    Clinical Presentation  Stable    Clinical Decision Making  Low    Rehab Potential  Excellent    PT Frequency  3x / week    PT Duration  4 weeks    PT Treatment/Interventions  Cryotherapy;Ultrasound;Moist Heat;ADLs/Self Care Home Management;Electrical Stimulation;Iontophoresis 4mg /ml Dexamethasone;Functional mobility training;Neuromuscular re-education;Therapeutic exercise;Therapeutic activities;Patient/family education;Manual techniques;Passive range of motion;Scar mobilization;Vasopneumatic Device    PT Next Visit Plan  AAROM ; cont with POC for PROM to right, see protocol; modalities for pain relief MD follow up 03/01/18 with Dr. Patriciaann Clan and Agree with Plan of Care  Patient       Patient will benefit from skilled therapeutic intervention in order to improve the following deficits and impairments:  Pain, Decreased strength, Decreased range of motion, Impaired UE functional use  Visit Diagnosis: Acute pain of right shoulder  Stiffness of right shoulder, not elsewhere classified  Muscle weakness (generalized)     Problem List Patient Active Problem List   Diagnosis Date Noted  . BPH (benign prostatic hyperplasia) 11/25/2015  . Sprain and strain of metatarsophalangeal joint 04/02/2015  . Erectile dysfunction   . Diabetes mellitus ([place])   . Hyperlipidemia associated with type 2 diabetes mellitus ([place]) 05/25/2012  . HTN (hypertension) 05/25/2012   [name], PT, DPT 02/27/2018, 1:10 PM  [facility] [address] [place], [place], [address] Phone: [phone]   Fax:  [phone]  Name: Tony Hodges MRN: [id number] Date of Birth: Jun 08, 1950

## 2018-03-01 DIAGNOSIS — Z4789 Encounter for other orthopedic aftercare: Secondary | ICD-10-CM | POA: Insufficient documentation

## 2018-03-03 ENCOUNTER — Ambulatory Visit: Payer: Medicare Other | Admitting: *Deleted

## 2018-03-03 DIAGNOSIS — M6281 Muscle weakness (generalized): Secondary | ICD-10-CM | POA: Diagnosis not present

## 2018-03-03 DIAGNOSIS — M25511 Pain in right shoulder: Secondary | ICD-10-CM

## 2018-03-03 DIAGNOSIS — M25611 Stiffness of right shoulder, not elsewhere classified: Secondary | ICD-10-CM

## 2018-03-03 NOTE — Therapy (Signed)
[facility] [place], [place], [address] Phone: [phone]   Fax:  [phone]  Physical Therapy Treatment  Patient Details  Name: Tony Hodges MRN: [id number] Date of Birth: [birth year] Referring Provider (PT): Sydnee Cabal, MD   Encounter Date: 03/03/2018  PT End of Session - 03/03/18 0940    Visit Number  12    Number of Visits  24    Date for PT Re-Evaluation  03/29/18   New Order 03-01-2018 cont PT   Authorization Type  FOTO; progress note every 10th visit     12th visit FOTO 55% limitation    PT Start Time  0944    PT Stop Time  1038    PT Time Calculation (min)  54 min       Past Medical History:  Diagnosis Date  . Diabetes mellitus   . Erectile dysfunction   . Hyperlipidemia   . Hypertension     Past Surgical History:  Procedure Laterality Date  . UMBILICAL HERNIA REPAIR  2010    There were no vitals filed for this visit.                    [facility] Adult PT Treatment/Exercise - 03/03/18 0001      Exercises   Exercises  Shoulder      Shoulder Exercises: Pulleys   Flexion  5 minutes    Other Pulley Exercises  UE ranger sitting x 5 mins flexion/extension, circles each way      Electrical Stimulation   Electrical Stimulation Location  R shoulder IFC x 15 mins 80-150hz     Electrical Stimulation Goals  Pain      Vasopneumatic   Number Minutes Vasopneumatic   15 minutes    Vasopnuematic Location   Shoulder    Vasopneumatic Pressure  Low    Vasopneumatic Temperature   38      Manual Therapy   Manual Therapy  Passive ROM    Passive ROM  PROM of R shoulder into flex, ER, IR with gentle holds at end range with intermittant oscillations provided for relaxation               PT Short Term Goals - 02/02/18 1245      PT SHORT TERM GOAL #1   Title  STG=LTG        PT Long Term Goals - 02/15/18 1339      PT LONG TERM GOAL #1   Title  Patient will be independent with  HEP and its progression    Time  4    Period  Weeks    Status  On-going      PT LONG TERM GOAL #2   Title  Patient will demonstrate 150+ degrees of right shoulder flexion AROM to improve ability to perform functional tasks.    Time  4    Period  Weeks    Status  On-going      PT LONG TERM GOAL #3   Title  Patient will demonstrate 60+ degrees of right shoulder external rotation AROM to improve ability to don/doff apparel.    Time  4    Period  Weeks    Status  On-going      PT LONG TERM GOAL #4   Title  Patient will demonstrate 4/5 or greater right shoulder MMT to improve stability during functional tasks.    Time  4    Period  Weeks    Status  On-going      PT LONG TERM GOAL #5   Title  Patient will demonstrate ability to perfrom all ADLs independently with pain less than 3/10 in right shoulder.    Time  4    Period  Weeks    Status  On-going            Plan - 03/03/18 5597    Clinical Impression Statement  Pt arrived today doing well after MD f/u and N.O. to cont PT. He did great with AAROM exs and PROM as well. Home pulleys were issued for HEP. Normal modality response today.    Clinical Decision Making  Low    Rehab Potential  Excellent    PT Frequency  3x / week    PT Duration  4 weeks    PT Treatment/Interventions  Cryotherapy;Ultrasound;Moist Heat;ADLs/Self Care Home Management;Electrical Stimulation;Iontophoresis 4mg /ml Dexamethasone;Functional mobility training;Neuromuscular re-education;Therapeutic exercise;Therapeutic activities;Patient/family education;Manual techniques;Passive range of motion;Scar mobilization;Vasopneumatic Device    PT Next Visit Plan  AAROM ; cont with POC for PROM to right, see protocol; modalities for pain relief MD Order to cont PT    PT Home Exercise Plan  see patient education section    Consulted and Agree with Plan of Care  Patient       Patient will benefit from skilled therapeutic intervention in order to improve the following  deficits and impairments:  Pain, Decreased strength, Decreased range of motion, Impaired UE functional use  Visit Diagnosis: Stiffness of right shoulder, not elsewhere classified  Acute pain of right shoulder  Muscle weakness (generalized)     Problem List Patient Active Problem List   Diagnosis Date Noted  . BPH (benign prostatic hyperplasia) 11/25/2015  . Sprain and strain of metatarsophalangeal joint 04/02/2015  . Erectile dysfunction   . Diabetes mellitus ([place])   . Hyperlipidemia associated with type 2 diabetes mellitus ([place]) 05/25/2012  . HTN (hypertension) 05/25/2012    RAMSEUR,CHRIS, PTA 03/03/2018, 12:34 PM  [facility] [address] [place], [place], [address] Phone: [phone]   Fax:  [phone]  Name: MACKEY VARRICCHIO MRN: [id number] Date of Birth: [birth year]

## 2018-03-06 ENCOUNTER — Ambulatory Visit: Payer: Medicare Other | Admitting: Physical Therapy

## 2018-03-06 ENCOUNTER — Encounter: Payer: Self-pay | Admitting: Physical Therapy

## 2018-03-06 DIAGNOSIS — M25511 Pain in right shoulder: Secondary | ICD-10-CM | POA: Diagnosis not present

## 2018-03-06 DIAGNOSIS — M6281 Muscle weakness (generalized): Secondary | ICD-10-CM

## 2018-03-06 DIAGNOSIS — M25611 Stiffness of right shoulder, not elsewhere classified: Secondary | ICD-10-CM | POA: Diagnosis not present

## 2018-03-06 NOTE — Therapy (Signed)
[facility] [place], [place], [address] Phone: [phone]   Fax:  [phone]  Physical Therapy Treatment  Patient Details  Name: Tony Hodges MRN: [id number] Date of Birth: 05-03-1950 Referring Provider (PT): Sydnee Cabal, MD   Encounter Date: 03/06/2018  PT End of Session - 03/06/18 1026    Visit Number  13    Number of Visits  24    Date for PT Re-Evaluation  03/29/18    Authorization Type  FOTO; progress note every 10th visit     12th visit FOTO 55% limitation    PT Start Time  0945    PT Stop Time  1038    PT Time Calculation (min)  53 min    Activity Tolerance  Patient tolerated treatment well    Behavior During Therapy  [facility] for tasks assessed/performed       Past Medical History:  Diagnosis Date  . Diabetes mellitus   . Erectile dysfunction   . Hyperlipidemia   . Hypertension     Past Surgical History:  Procedure Laterality Date  . UMBILICAL HERNIA REPAIR  2010    There were no vitals filed for this visit.  Subjective Assessment - 03/06/18 0951    Subjective  Patient reported some soreness after last week    Pertinent History  HTN, DM, R RTC 01/24/2018    Limitations  Lifting;House hold activities    Diagnostic tests  MRI, X-Ray    Patient Stated Goals  improve movement, play golf    Currently in Pain?  Yes    Pain Score  3     Pain Location  Shoulder    Pain Orientation  Right    Pain Descriptors / Indicators  Tightness    Pain Type  Surgical pain    Pain Onset  More than a month ago    Pain Frequency  Intermittent    Aggravating Factors   movement of shoulder    Pain Relieving Factors  at rest         [facility] PT Assessment - 03/06/18 0001      PROM   PROM Assessment Site  Shoulder    Right/Left Shoulder  Right    Right Shoulder Flexion  125 Degrees    Right Shoulder External Rotation  46 Degrees                   [name] - 03/06/18 0001       Shoulder Exercises: Supine   Other Supine Exercises  supine cane for ER and flexion 2x10      Shoulder Exercises: Pulleys   Flexion  5 minutes    Other Pulley Exercises  UE ranger sitting x 5 mins flexion/extension, circles each way      Electrical Stimulation   Electrical Stimulation Location  R shoulder IFC x 15 mins 80-'150hz'     Electrical Stimulation Goals  Pain      Vasopneumatic   Number Minutes Vasopneumatic   15 minutes    Vasopnuematic Location   Shoulder    Vasopneumatic Pressure  Low      Manual Therapy   Manual Therapy  Passive ROM    Passive ROM  PROM of R shoulder into flex, ER, IR with gentle holds at end range with intermittant oscillations provided for relaxation               PT Short Term Goals - 02/02/18 1245  PT SHORT TERM GOAL #1   Title  STG=LTG        PT Long Term Goals - 02/15/18 1339      PT LONG TERM GOAL #1   Title  Patient will be independent with HEP and its progression    Time  4    Period  Weeks    Status  On-going      PT LONG TERM GOAL #2   Title  Patient will demonstrate 150+ degrees of right shoulder flexion AROM to improve ability to perform functional tasks.    Time  4    Period  Weeks    Status  On-going      PT LONG TERM GOAL #3   Title  Patient will demonstrate 60+ degrees of right shoulder external rotation AROM to improve ability to don/doff apparel.    Time  4    Period  Weeks    Status  On-going      PT LONG TERM GOAL #4   Title  Patient will demonstrate 4/5 or greater right shoulder MMT to improve stability during functional tasks.    Time  4    Period  Weeks    Status  On-going      PT LONG TERM GOAL #5   Title  Patient will demonstrate ability to perfrom all ADLs independently with pain less than 3/10 in right shoulder.    Time  4    Period  Weeks    Status  On-going            Plan - 03/06/18 1027    Clinical Impression Statement  Patient tolerated treatment well today. Patient able to  progress with AAROM today. Patient continues to have stiffness in right shoulder with all motions and required ossilations to relax. No futher goals met today.     Rehab Potential  Excellent    Clinical Impairments Affecting Rehab Potential  surgery 01/24/18 6 weeks 03/07/18    PT Frequency  3x / week    PT Duration  4 weeks    PT Treatment/Interventions  Cryotherapy;Ultrasound;Moist Heat;ADLs/Self Care Home Management;Electrical Stimulation;Iontophoresis 3m/ml Dexamethasone;Functional mobility training;Neuromuscular re-education;Therapeutic exercise;Therapeutic activities;Patient/family education;Manual techniques;Passive range of motion;Scar mobilization;Vasopneumatic Device    PT Next Visit Plan  AAROM ; cont with POC for PROM to right, see protocol; modalities for pain relief consider LLLDS ER with [place] improve ROM per PT recommendation    Consulted and Agree with Plan of Care  Patient       Patient will benefit from skilled therapeutic intervention in order to improve the following deficits and impairments:  Pain, Decreased strength, Decreased range of motion, Impaired UE functional use  Visit Diagnosis: Stiffness of right shoulder, not elsewhere classified  Acute pain of right shoulder  Muscle weakness (generalized)     Problem List Patient Active Problem List   Diagnosis Date Noted  . BPH (benign prostatic hyperplasia) 11/25/2015  . Sprain and strain of metatarsophalangeal joint 04/02/2015  . Erectile dysfunction   . Diabetes mellitus ([place]   . Hyper[place] with [place] 2 diabetes mellitus [place] 05/25/2012  . HTN (hypertension) 05/25/2012    Yukie Bergeron P, PTA 03/06/2018, 10:41 AM  [facility] [address]Phone: [phone]  Fax:  [phone] Name: Tony GLASSCOCKMRN: [id number] of Birth: 4Jan 13, 1952

## 2018-03-08 ENCOUNTER — Ambulatory Visit: Payer: Medicare Other | Admitting: Physical Therapy

## 2018-03-08 DIAGNOSIS — M25611 Stiffness of right shoulder, not elsewhere classified: Secondary | ICD-10-CM

## 2018-03-08 DIAGNOSIS — M6281 Muscle weakness (generalized): Secondary | ICD-10-CM | POA: Diagnosis not present

## 2018-03-08 DIAGNOSIS — M25511 Pain in right shoulder: Secondary | ICD-10-CM

## 2018-03-08 NOTE — Therapy (Addendum)
[facility] [place], [place], [address] Phone: [phone]   Fax:  [phone]  Physical Therapy Treatment  Patient Details  Name: Tony Hodges MRN: [id number] Date of Birth: 18-Jan-1950 Referring Provider (PT): Sydnee Cabal, MD   Encounter Date: 03/08/2018  PT End of Session - 03/08/18 1406    Visit Number  14    Number of Visits  24    Date for PT Re-Evaluation  03/29/18    Authorization Type  FOTO; progress note every 10th visit     12th visit FOTO 55% limitation    PT Start Time  0100    PT Stop Time  0154    PT Time Calculation (min)  54 min    Activity Tolerance  Patient tolerated treatment well    Behavior During Therapy  [facility] for tasks assessed/performed       Past Medical History:  Diagnosis Date  . Diabetes mellitus   . Erectile dysfunction   . Hyperlipidemia   . Hypertension     Past Surgical History:  Procedure Laterality Date  . UMBILICAL HERNIA REPAIR  2010    There were no vitals filed for this visit.  Subjective Assessment - 03/08/18 1347    Subjective  Little more soreness.    Diagnostic tests  MRI, X-Ray    Patient Stated Goals  improve movement, play golf    Currently in Pain?  Yes    Pain Score  4     Pain Location  Shoulder    Pain Orientation  Right    Pain Type  Surgical pain    Pain Onset  More than a month ago         [facility] PT Assessment - 03/08/18 0001      PROM   Right Shoulder Flexion  --   133 degrees.   Right Shoulder External Rotation  --   52 degrees.                  [facility] Adult PT Treatment/Exercise - 03/08/18 0001      Vasopneumatic   Number Minutes Vasopneumatic   20 minutes    Vasopnuematic Location   --   Right shoulder.   Vasopneumatic Pressure  Low      Manual Therapy   Manual Therapy  Passive ROM    Passive ROM  PROM in supine to patient's right shoulder with focus on flexion and ER with low load long duration stretching technique  utilized x 23 minutes.      IFC at 80-150 Hz x 20 minutes.         PT Short Term Goals - 02/02/18 1245      PT SHORT TERM GOAL #1   Title  STG=LTG        PT Long Term Goals - 02/15/18 1339      PT LONG TERM GOAL #1   Title  Patient will be independent with HEP and its progression    Time  4    Period  Weeks    Status  On-going      PT LONG TERM GOAL #2   Title  Patient will demonstrate 150+ degrees of right shoulder flexion AROM to improve ability to perform functional tasks.    Time  4    Period  Weeks    Status  On-going      PT LONG TERM GOAL #3   Title  Patient will demonstrate 60+ degrees of right  shoulder external rotation AROM to improve ability to don/doff apparel.    Time  4    Period  Weeks    Status  On-going      PT LONG TERM GOAL #4   Title  Patient will demonstrate 4/5 or greater right shoulder MMT to improve stability during functional tasks.    Time  4    Period  Weeks    Status  On-going      PT LONG TERM GOAL #5   Title  Patient will demonstrate ability to perfrom all ADLs independently with pain less than 3/10 in right shoulder.    Time  4    Period  Weeks    Status  On-going            Plan - 03/08/18 1353    Clinical Impression Statement  Right flexion passive to 133 degrees and ER= 52 degrees.  Added corner stretch and flexion stretch up wall assisting the left hand.    PT Treatment/Interventions  Cryotherapy;Ultrasound;Moist Heat;ADLs/Self Care Home Management;Electrical Stimulation;Iontophoresis 4mg /ml Dexamethasone;Functional mobility training;Neuromuscular re-education;Therapeutic exercise;Therapeutic activities;Patient/family education;Manual techniques;Passive range of motion;Scar mobilization;Vasopneumatic Device    PT Next Visit Plan  AAROM ; cont with POC for PROM to right, see protocol; modalities for pain relief consider LLLDS ER with [place] to improve ROM per PT recommendation    PT Home Exercise Plan  see patient education  section    Consulted and Agree with Plan of Care  Patient       Patient will benefit from skilled therapeutic intervention in order to improve the following deficits and impairments:  Pain, Decreased strength, Decreased range of motion, Impaired UE functional use  Visit Diagnosis: Stiffness of right shoulder, not elsewhere classified  Acute pain of right shoulder     Problem List Patient Active Problem List   Diagnosis Date Noted  . BPH (benign prostatic hyperplasia) 11/25/2015  . Sprain and strain of metatarsophalangeal joint 04/02/2015  . Erectile dysfunction   . Diabetes mellitus ([place])   . Hyperlipidemia associated with type 2 diabetes mellitus ([place]) 05/25/2012  . HTN (hypertension) 05/25/2012    Tayvien Kane, [place] MPT 03/08/2018, 2:10 PM  [facility] [address] [place], [place], [address] Phone: [phone]   Fax:  [phone]  Name: Tony Hodges MRN: [id number] Date of Birth: 1950-03-30

## 2018-03-08 NOTE — Patient Instructions (Signed)
Patient to use left hand to assist right UE up wall.

## 2018-03-10 ENCOUNTER — Ambulatory Visit: Payer: Medicare Other | Admitting: Physical Therapy

## 2018-03-10 ENCOUNTER — Encounter: Payer: Self-pay | Admitting: Physical Therapy

## 2018-03-10 DIAGNOSIS — M25511 Pain in right shoulder: Secondary | ICD-10-CM

## 2018-03-10 DIAGNOSIS — M25611 Stiffness of right shoulder, not elsewhere classified: Secondary | ICD-10-CM

## 2018-03-10 DIAGNOSIS — M6281 Muscle weakness (generalized): Secondary | ICD-10-CM

## 2018-03-10 NOTE — Therapy (Signed)
[facility] [place], [place], [address] Phone: [phone]   Fax:  [phone]  Physical Therapy Treatment  Patient Details  Name: STANLEE ROEHRIG MRN: [id number] Date of Birth: 1950-10-08 Referring Provider (PT): Sydnee Cabal, MD   Encounter Date: 03/10/2018  PT End of Session - 03/10/18 1310    Visit Number  15    Number of Visits  24    Date for PT Re-Evaluation  03/29/18    Authorization Type  FOTO; progress note every 10th visit     12th visit FOTO 55% limitation    PT Start Time  0945    PT Stop Time  1038    PT Time Calculation (min)  53 min    Activity Tolerance  Patient tolerated treatment well    Behavior During Therapy  [facility] for tasks assessed/performed       Past Medical History:  Diagnosis Date  . Diabetes mellitus   . Erectile dysfunction   . Hyperlipidemia   . Hypertension     Past Surgical History:  Procedure Laterality Date  . UMBILICAL HERNIA REPAIR  2010    There were no vitals filed for this visit.  Subjective Assessment - 03/10/18 1309    Subjective  Patient reports performing new HEP provided last visit.     Pertinent History  HTN, DM, R RTC 01/24/2018    Limitations  Lifting;House hold activities    Diagnostic tests  MRI, X-Ray    Patient Stated Goals  improve movement, play golf    Currently in Pain?  Yes    Pain Score  2     Pain Location  Shoulder    Pain Orientation  Right    Pain Descriptors / Indicators  Tightness    Pain Type  Surgical pain    Pain Onset  More than a month ago    Pain Frequency  Intermittent         [facility] PT Assessment - 03/10/18 0001      Assessment   Medical Diagnosis  right shoulder scope SAD/DCR/biceps tenodesis RTC repair    Referring Provider (PT)  Sydnee Cabal, MD    Onset Date/Surgical Date  01/24/18    Hand Dominance  Left    Next MD Visit  04/12/2018    Prior Therapy  no                   [name]  - 03/10/18 0001      Shoulder Exercises: Standing   Flexion  AAROM;10 reps   assist at elbow     Shoulder Exercises: Pulleys   Flexion  5 minutes      Electrical Stimulation   Electrical Stimulation Location  15    Electrical Stimulation Action  IFC    Electrical Stimulation Parameters  80-150 hz x15 mins    Electrical Stimulation Goals  Pain      Vasopneumatic   Number Minutes Vasopneumatic   15 minutes    Vasopnuematic Location   Shoulder    Vasopneumatic Pressure  Low    Vasopneumatic Temperature   36      Manual Therapy   Manual Therapy  Passive ROM    Manual therapy comments  PA joint mobilizations grade 1-2 to decrease pain    Passive ROM  PROM in supine to patient's right shoulder with focus on flexion and ER with low load long duration stretching technique; intermittent oscillations for pain relief  PT Short Term Goals - 02/02/18 1245      PT SHORT TERM GOAL #1   Title  STG=LTG        PT Long Term Goals - 02/15/18 1339      PT LONG TERM GOAL #1   Title  Patient will be independent with HEP and its progression    Time  4    Period  Weeks    Status  On-going      PT LONG TERM GOAL #2   Title  Patient will demonstrate 150+ degrees of right shoulder flexion AROM to improve ability to perform functional tasks.    Time  4    Period  Weeks    Status  On-going      PT LONG TERM GOAL #3   Title  Patient will demonstrate 60+ degrees of right shoulder external rotation AROM to improve ability to don/doff apparel.    Time  4    Period  Weeks    Status  On-going      PT LONG TERM GOAL #4   Title  Patient will demonstrate 4/5 or greater right shoulder MMT to improve stability during functional tasks.    Time  4    Period  Weeks    Status  On-going      PT LONG TERM GOAL #5   Title  Patient will demonstrate ability to perfrom all ADLs independently with pain less than 3/10 in right shoulder.    Time  4    Period  Weeks    Status   On-going            Plan - 03/10/18 1310    Clinical Impression Statement  Patient was able to tolerate treatment well. Session emphasized on  PROM ER and patient noted with improved range toward end of session. Patient noted with decrease of pain with oscillations and posterior grade 1-2 joint mobilizations. No adverse affects noted upon removal of modalities.     Clinical Presentation  Stable    Clinical Decision Making  Low    Rehab Potential  Excellent    Clinical Impairments Affecting Rehab Potential  surgery 01/24/18 6 weeks 03/07/18    PT Frequency  3x / week    PT Duration  4 weeks    PT Treatment/Interventions  Cryotherapy;Ultrasound;Moist Heat;ADLs/Self Care Home Management;Electrical Stimulation;Iontophoresis 4mg /ml Dexamethasone;Functional mobility training;Neuromuscular re-education;Therapeutic exercise;Therapeutic activities;Patient/family education;Manual techniques;Passive range of motion;Scar mobilization;Vasopneumatic Device    PT Next Visit Plan  AAROM ; cont with POC for PROM to right, see protocol; modalities for pain relief consider LLLDS ER with [place] to improve ROM per PT recommendation    Consulted and Agree with Plan of Care  Patient       Patient will benefit from skilled therapeutic intervention in order to improve the following deficits and impairments:  Pain, Decreased strength, Decreased range of motion, Impaired UE functional use  Visit Diagnosis: Stiffness of right shoulder, not elsewhere classified  Acute pain of right shoulder  Muscle weakness (generalized)     Problem List Patient Active Problem List   Diagnosis Date Noted  . BPH (benign prostatic hyperplasia) 11/25/2015  . Sprain and strain of metatarsophalangeal joint 04/02/2015  . Erectile dysfunction   . Diabetes mellitus ([place])   . Hyperlipidemia associated with type 2 diabetes mellitus ([place]) 05/25/2012  . HTN (hypertension) 05/25/2012   [name], PT, DPT  03/10/2018, 1:16  PM  [facility] [place], [place],  [place] Phone: [phone]   Fax:  [phone]  Name: ROYER CRISTOBAL MRN: [id number] Date of Birth: August 20, 1950

## 2018-03-13 ENCOUNTER — Ambulatory Visit: Payer: Medicare Other | Attending: Specialist | Admitting: Physical Therapy

## 2018-03-13 DIAGNOSIS — M25611 Stiffness of right shoulder, not elsewhere classified: Secondary | ICD-10-CM | POA: Diagnosis not present

## 2018-03-13 DIAGNOSIS — M6281 Muscle weakness (generalized): Secondary | ICD-10-CM | POA: Diagnosis not present

## 2018-03-13 DIAGNOSIS — M25511 Pain in right shoulder: Secondary | ICD-10-CM | POA: Diagnosis not present

## 2018-03-13 NOTE — Therapy (Signed)
[facility] [place], [place], [address] Phone: [phone]   Fax:  [phone]  Physical Therapy Treatment  Patient Details  Name: Tony Hodges MRN: [id number] Date of Birth: [birth year] Referring Provider (PT): Sydnee Cabal, MD   Encounter Date: 03/13/2018  PT End of Session - 03/13/18 0948    Visit Number  16    Number of Visits  24    Date for PT Re-Evaluation  03/29/18    Authorization Type  FOTO; progress note every 10th visit     12th visit FOTO 55% limitation    PT Start Time  0947    PT Stop Time  1035    PT Time Calculation (min)  48 min    Activity Tolerance  Patient tolerated treatment well    Behavior During Therapy  [facility] for tasks assessed/performed       Past Medical History:  Diagnosis Date  . Diabetes mellitus   . Erectile dysfunction   . Hyperlipidemia   . Hypertension     Past Surgical History:  Procedure Laterality Date  . UMBILICAL HERNIA REPAIR  2010    There were no vitals filed for this visit.  Subjective Assessment - 03/13/18 0948    Subjective  No new complaints.    Pertinent History  HTN, DM, R RTC 01/24/2018    Limitations  Lifting;House hold activities    Diagnostic tests  MRI, X-Ray    Patient Stated Goals  improve movement, play golf    Currently in Pain?  Other (Comment)   No pain assessment provided by patient        [facility] PT Assessment - 03/13/18 0001      Assessment   Medical Diagnosis  right shoulder scope SAD/DCR/biceps tenodesis RTC repair    Referring Provider (PT)  Sydnee Cabal, MD    Onset Date/Surgical Date  01/24/18    Hand Dominance  Left    Next MD Visit  04/12/2018    Prior Therapy  no      Precautions   Precautions  Shoulder    Type of Shoulder Precautions  RTC repair; see media for RTC repair [place] orthopedics protocol      ROM / Strength   AROM / PROM / Strength  AROM      AROM   Overall AROM   Deficits    AROM Assessment Site  Shoulder    Right/Left Shoulder  Right    Right Shoulder Flexion  125 Degrees    Right Shoulder External Rotation  60 Degrees      PROM   Right Shoulder Flexion  --                   [name] - 03/13/18 0001      Shoulder Exercises: Supine   External Rotation  AAROM;Both;20 reps    Flexion  AAROM;Both;20 reps      Shoulder Exercises: Standing   Other Standing Exercises  Wall ladder x5 reps (level 30)      Shoulder Exercises: Pulleys   Flexion  5 minutes      Shoulder Exercises: ROM/Strengthening   [place]  Seated flex, CW and CCW circles x20 reps each      Shoulder Exercises: Stretch   Corner Stretch  3 reps;30 seconds      Modalities   Modalities  Electrical Stimulation;Vasopneumatic      Acupuncturist Location  R shoulder  Electrical Stimulation Action  IFC    Electrical Stimulation Parameters  80-150 hz x15 min    Electrical Stimulation Goals  Pain      Vasopneumatic   Number Minutes Vasopneumatic   15 minutes    Vasopnuematic Location   Shoulder    Vasopneumatic Pressure  Low    Vasopneumatic Temperature   50      Manual Therapy   Manual Therapy  Passive ROM    Passive ROM  PROM of R shoulder into flexion, ER, IR with gentle holds at end range               PT Short Term Goals - 02/02/18 1245      PT SHORT TERM GOAL #1   Title  STG=LTG        PT Long Term Goals - 02/15/18 1339      PT LONG TERM GOAL #1   Title  Patient will be independent with HEP and its progression    Time  4    Period  Weeks    Status  On-going      PT LONG TERM GOAL #2   Title  Patient will demonstrate 150+ degrees of right shoulder flexion AROM to improve ability to perform functional tasks.    Time  4    Period  Weeks    Status  On-going      PT LONG TERM GOAL #3   Title  Patient will demonstrate 60+ degrees of right shoulder external rotation AROM to improve ability to don/doff apparel.    Time  4     Period  Weeks    Status  On-going      PT LONG TERM GOAL #4   Title  Patient will demonstrate 4/5 or greater right shoulder MMT to improve stability during functional tasks.    Time  4    Period  Weeks    Status  On-going      PT LONG TERM GOAL #5   Title  Patient will demonstrate ability to perfrom all ADLs independently with pain less than 3/10 in right shoulder.    Time  4    Period  Weeks    Status  On-going            Plan - 03/13/18 1043    Clinical Impression Statement  Patient presented in clinic and was able to tolerate treatment fairly well. Patient able to complete therex with only minimal VCs and demo for any technique corrections. Intermittant oscillations utilized throughout treatment to reduce guarding during PROM. Firm end feels and smooth arc of motion noted following removal of the modalities.    Rehab Potential  Excellent    Clinical Impairments Affecting Rehab Potential  surgery 01/24/18 6 weeks 03/07/18    PT Frequency  3x / week    PT Duration  4 weeks    PT Treatment/Interventions  Cryotherapy;Ultrasound;Moist Heat;ADLs/Self Care Home Management;Electrical Stimulation;Iontophoresis 4mg /ml Dexamethasone;Functional mobility training;Neuromuscular re-education;Therapeutic exercise;Therapeutic activities;Patient/family education;Manual techniques;Passive range of motion;Scar mobilization;Vasopneumatic Device    PT Next Visit Plan  AAROM ; cont with POC for PROM to right, see protocol; modalities for pain relief consider LLLDS ER with [place] to improve ROM per PT recommendation    PT Home Exercise Plan  see patient education section    Consulted and Agree with Plan of Care  Patient       Patient will benefit from skilled therapeutic intervention in order to improve the following deficits and impairments:  Pain, Decreased strength, Decreased range of motion, Impaired UE functional use  Visit Diagnosis: Stiffness of right shoulder, not elsewhere classified  Acute  pain of right shoulder  Muscle weakness (generalized)     Problem List Patient Active Problem List   Diagnosis Date Noted  . BPH (benign prostatic hyperplasia) 11/25/2015  . Sprain and strain of metatarsophalangeal joint 04/02/2015  . Erectile dysfunction   . Diabetes mellitus ([place])   . Hyperlipidemia associated with type 2 diabetes mellitus ([place]) 05/25/2012  . HTN (hypertension) 05/25/2012    Standley Brooking, PTA 03/13/2018, 11:09 AM  [facility] [place], [place], [address] Phone: [phone]   Fax:  [phone]  Name: Tony Hodges MRN: [id number] Date of Birth: 10/21/1950

## 2018-03-15 ENCOUNTER — Ambulatory Visit: Payer: Medicare Other | Admitting: Physical Therapy

## 2018-03-15 DIAGNOSIS — M25511 Pain in right shoulder: Secondary | ICD-10-CM | POA: Diagnosis not present

## 2018-03-15 DIAGNOSIS — M25611 Stiffness of right shoulder, not elsewhere classified: Secondary | ICD-10-CM

## 2018-03-15 DIAGNOSIS — M6281 Muscle weakness (generalized): Secondary | ICD-10-CM | POA: Diagnosis not present

## 2018-03-15 NOTE — Therapy (Signed)
[facility] [place], [place], [address] Phone: [phone]   Fax:  [phone]  Physical Therapy Treatment  Patient Details  Name: Tony Hodges MRN: [id number] Date of Birth: [birth year] Referring Provider (PT): Sydnee Cabal, MD   Encounter Date: 03/15/2018  PT End of Session - 03/15/18 1302    Visit Number  17    Number of Visits  24    Date for PT Re-Evaluation  03/29/18    Authorization Type  FOTO; progress note every 10th visit     12th visit FOTO 55% limitation    PT Start Time  1300    PT Stop Time  1348    PT Time Calculation (min)  48 min    Activity Tolerance  Patient tolerated treatment well    Behavior During Therapy  [facility] for tasks assessed/performed       Past Medical History:  Diagnosis Date  . Diabetes mellitus   . Erectile dysfunction   . Hyperlipidemia   . Hypertension     Past Surgical History:  Procedure Laterality Date  . UMBILICAL HERNIA REPAIR  2010    There were no vitals filed for this visit.  Subjective Assessment - 03/15/18 1302    Subjective  No new complaints.    Pertinent History  HTN, DM, R RTC 01/24/2018    Limitations  Lifting;House hold activities    Diagnostic tests  MRI, X-Ray    Patient Stated Goals  improve movement, play golf    Currently in Pain?  No/denies         [facility] PT Assessment - 03/15/18 0001      Assessment   Medical Diagnosis  right shoulder scope SAD/DCR/biceps tenodesis RTC repair    Referring Provider (PT)  Sydnee Cabal, MD    Onset Date/Surgical Date  01/24/18    Hand Dominance  Left    Next MD Visit  04/12/2018    Prior Therapy  no      Precautions   Precautions  Shoulder    Type of Shoulder Precautions  RTC repair; see media for RTC repair [place] orthopedics protocol                   [facility] Adult PT Treatment/Exercise - 03/15/18 0001      Shoulder Exercises: Supine   Protraction  AAROM;Both;20 reps    External Rotation   AAROM;20 reps;Weights;Strengthening;Both   5 sec hold at end range   Flexion  AAROM;Both;20 reps      Shoulder Exercises: Standing   Other Standing Exercises  Wall ladder x10 reps (level 32-33)      Shoulder Exercises: ROM/Strengthening   [place]  Standing flex, CW and CCW circles x20 reps each      Shoulder Exercises: Isometric Strengthening   Flexion  5X10"    Extension  5X10"    External Rotation  5X10"    Internal Rotation  5X10"      Shoulder Exercises: Stretch   Corner Stretch  3 reps;30 seconds      Modalities   Modalities  Electrical Stimulation;Vasopneumatic      Electrical Stimulation   Electrical Stimulation Location  R shoulder    Electrical Stimulation Action  Pre-mod    Electrical Stimulation Parameters  80-150 hz x15 min    Electrical Stimulation Goals  Pain      Vasopneumatic   Number Minutes Vasopneumatic   15 minutes    Vasopnuematic Location   Shoulder  Vasopneumatic Pressure  Low    Vasopneumatic Temperature   34      Manual Therapy   Manual Therapy  Passive ROM    Passive ROM  PROM of R shoulder into flexion, ER, IR with gentle holds at end range               PT Short Term Goals - 02/02/18 1245      PT SHORT TERM GOAL #1   Title  STG=LTG        PT Long Term Goals - 02/15/18 1339      PT LONG TERM GOAL #1   Title  Patient will be independent with HEP and its progression    Time  4    Period  Weeks    Status  On-going      PT LONG TERM GOAL #2   Title  Patient will demonstrate 150+ degrees of right shoulder flexion AROM to improve ability to perform functional tasks.    Time  4    Period  Weeks    Status  On-going      PT LONG TERM GOAL #3   Title  Patient will demonstrate 60+ degrees of right shoulder external rotation AROM to improve ability to don/doff apparel.    Time  4    Period  Weeks    Status  On-going      PT LONG TERM GOAL #4   Title  Patient will demonstrate 4/5 or greater right shoulder MMT to improve  stability during functional tasks.    Time  4    Period  Weeks    Status  On-going      PT LONG TERM GOAL #5   Title  Patient will demonstrate ability to perfrom all ADLs independently with pain less than 3/10 in right shoulder.    Time  4    Period  Weeks    Status  On-going            Plan - 03/15/18 1335    Clinical Impression Statement  Patient presented in clinic with no complaints of R shoulder pain or discomfort. Patient progressed into more standing, antigravity AAROM exercises. Patient progressed to isometric strengthening as well which he tolerated well. Firm end feels and smooth arc of motion noted during PROM of R shoulder in all directions. Normal modalities response noted following removal of the modalities.    Rehab Potential  Excellent    Clinical Impairments Affecting Rehab Potential  surgery 01/24/18 6 weeks 03/07/18    PT Frequency  3x / week    PT Duration  4 weeks    PT Treatment/Interventions  Cryotherapy;Ultrasound;Moist Heat;ADLs/Self Care Home Management;Electrical Stimulation;Iontophoresis 4mg /ml Dexamethasone;Functional mobility training;Neuromuscular re-education;Therapeutic exercise;Therapeutic activities;Patient/family education;Manual techniques;Passive range of motion;Scar mobilization;Vasopneumatic Device    PT Next Visit Plan  AAROM ; cont with POC for PROM to right, see protocol; modalities for pain relief consider LLLDS ER with [place] to improve ROM per PT recommendation    PT Home Exercise Plan  see patient education section    Consulted and Agree with Plan of Care  Patient       Patient will benefit from skilled therapeutic intervention in order to improve the following deficits and impairments:  Pain, Decreased strength, Decreased range of motion, Impaired UE functional use  Visit Diagnosis: Stiffness of right shoulder, not elsewhere classified  Acute pain of right shoulder  Muscle weakness (generalized)     Problem List Patient Active  Problem List  Diagnosis Date Noted  . BPH (benign prostatic hyperplasia) 11/25/2015  . Sprain and strain of metatarsophalangeal joint 04/02/2015  . Erectile dysfunction   . Diabetes mellitus ([place])   . Hyperlipidemia associated with type 2 diabetes mellitus ([place]) 05/25/2012  . HTN (hypertension) 05/25/2012    Standley Brooking, PTA 03/15/2018, 1:53 PM  [facility] [address] [place], [place], [address] Phone: [phone]   Fax:  [phone]  Name: Tony Hodges MRN: [id number] Date of Birth: December 07, 1950

## 2018-03-17 ENCOUNTER — Ambulatory Visit: Payer: Medicare Other | Admitting: Physical Therapy

## 2018-03-17 DIAGNOSIS — M25511 Pain in right shoulder: Secondary | ICD-10-CM

## 2018-03-17 DIAGNOSIS — M6281 Muscle weakness (generalized): Secondary | ICD-10-CM | POA: Diagnosis not present

## 2018-03-17 DIAGNOSIS — M25611 Stiffness of right shoulder, not elsewhere classified: Secondary | ICD-10-CM

## 2018-03-17 NOTE — Therapy (Signed)
[facility] [place], [place], [address] Phone: [phone]   Fax:  [phone]  Physical Therapy Treatment  Patient Details  Name: Tony Hodges MRN: [id number] Date of Birth: [birth year] Referring Provider (PT): Sydnee Cabal, MD   Encounter Date: 03/17/2018  PT End of Session - 03/17/18 0949    Visit Number  18    Number of Visits  24    Date for PT Re-Evaluation  03/29/18    Authorization Type  FOTO; progress note every 10th visit     12th visit FOTO 55% limitation    PT Start Time  0944    PT Stop Time  1042    PT Time Calculation (min)  58 min    Activity Tolerance  Patient tolerated treatment well    Behavior During Therapy  [facility] for tasks assessed/performed       Past Medical History:  Diagnosis Date  . Diabetes mellitus   . Erectile dysfunction   . Hyperlipidemia   . Hypertension     Past Surgical History:  Procedure Laterality Date  . UMBILICAL HERNIA REPAIR  2010    There were no vitals filed for this visit.  Subjective Assessment - 03/17/18 0949    Subjective  No new complaints.    Pertinent History  HTN, DM, R RTC 01/24/2018    Limitations  Lifting;House hold activities    Diagnostic tests  MRI, X-Ray    Patient Stated Goals  improve movement, play golf    Currently in Pain?  No/denies         [facility] PT Assessment - 03/17/18 0001      Assessment   Medical Diagnosis  right shoulder scope SAD/DCR/biceps tenodesis RTC repair    Referring Provider (PT)  Sydnee Cabal, MD    Onset Date/Surgical Date  01/24/18    Hand Dominance  Left    Next MD Visit  04/12/2018    Prior Therapy  no      Precautions   Precautions  Shoulder    Type of Shoulder Precautions  RTC repair; see media for RTC repair [place] orthopedics protocol      ROM / Strength   AROM / PROM / Strength  AROM      AROM   Overall AROM   Deficits    AROM Assessment Site  Shoulder    Right/Left Shoulder  Right    Right Shoulder  Flexion  122 Degrees    Right Shoulder Internal Rotation  53 Degrees    Right Shoulder External Rotation  54 Degrees                   [name] - 03/17/18 0001      Shoulder Exercises: Supine   Protraction  AAROM;Both;Other (comment)   x30 reps   External Rotation  AAROM;Right;Other (comment)   x30 reps   Flexion  AAROM;Both;Other (comment)   x30 reps     Shoulder Exercises: Standing   Other Standing Exercises  Wall ladder x10 reps (level 34)      Shoulder Exercises: Pulleys   Flexion  5 minutes      Shoulder Exercises: ROM/Strengthening   Ranger  Standing flex, CW and CCW circles x30 reps each      Shoulder Exercises: Isometric Strengthening   Flexion  5X10"    Extension  5X10"    External Rotation  5X10"    Internal Rotation  5X10"      Shoulder  Exercises: IT sales professional  3 reps;30 seconds      Modalities   Modalities  Building services engineer Action  Pre-Mod    Electrical Stimulation Parameters  80-150 hz x15 min    Electrical Stimulation Goals  Pain      Vasopneumatic   Number Minutes Vasopneumatic   15 minutes    Vasopnuematic Location   Shoulder    Vasopneumatic Pressure  Low    Vasopneumatic Temperature   36      Manual Therapy   Manual Therapy  Passive ROM    Passive ROM  PROM of R shoulder into flexion, ER, IR with gentle holds at end range               PT Short Term Goals - 02/02/18 1245      PT SHORT TERM GOAL #1   Title  STG=LTG        PT Long Term Goals - 02/15/18 1339      PT LONG TERM GOAL #1   Title  Patient will be independent with HEP and its progression    Time  4    Period  Weeks    Status  On-going      PT LONG TERM GOAL #2   Title  Patient will demonstrate 150+ degrees of right shoulder flexion AROM to improve ability to perform functional tasks.    Time  4     Period  Weeks    Status  On-going      PT LONG TERM GOAL #3   Title  Patient will demonstrate 60+ degrees of right shoulder external rotation AROM to improve ability to don/doff apparel.    Time  4    Period  Weeks    Status  On-going      PT LONG TERM GOAL #4   Title  Patient will demonstrate 4/5 or greater right shoulder MMT to improve stability during functional tasks.    Time  4    Period  Weeks    Status  On-going      PT LONG TERM GOAL #5   Title  Patient will demonstrate ability to perfrom all ADLs independently with pain less than 3/10 in right shoulder.    Time  4    Period  Weeks    Status  On-going            Plan - 03/17/18 1046    Clinical Impression Statement  Patient presented in clinic with no complaints of R shoulder pain prior to treatment. Patient continues to progress with exercises such as isometrics and stretching. Patient reported 2-3/10 R shoulder discomfort following therex. Anterior and posterior capsule tightness noted during treatment along with limited shoulder ER/IR. Firm end feels and smooth arc of motion noted in all directions of PROM of R shoulder. Normal modalities response noted following removal of the modalities.    Rehab Potential  Excellent    Clinical Impairments Affecting Rehab Potential  surgery 01/24/18 6 weeks 03/07/18    PT Frequency  3x / week    PT Duration  4 weeks    PT Treatment/Interventions  Cryotherapy;Ultrasound;Moist Heat;ADLs/Self Care Home Management;Electrical Stimulation;Iontophoresis 4mg /ml Dexamethasone;Functional mobility training;Neuromuscular re-education;Therapeutic exercise;Therapeutic activities;Patient/family education;Manual techniques;Passive range of motion;Scar mobilization;Vasopneumatic Device    PT Next Visit Plan  Progress to lawnchair AROM progression.    PT Home Exercise  Plan  see patient education section    Consulted and Agree with Plan of Care  Patient       Patient will benefit from skilled  therapeutic intervention in order to improve the following deficits and impairments:  Pain, Decreased strength, Decreased range of motion, Impaired UE functional use  Visit Diagnosis: Stiffness of right shoulder, not elsewhere classified  Acute pain of right shoulder  Muscle weakness (generalized)     Problem List Patient Active Problem List   Diagnosis Date Noted  . BPH (benign prostatic hyperplasia) 11/25/2015  . Sprain and strain of metatarsophalangeal joint 04/02/2015  . Erectile dysfunction   . Diabetes mellitus ([place])   . Hyperlipidemia associated with type 2 diabetes mellitus ([place]) 05/25/2012  . HTN (hypertension) 05/25/2012    Standley Brooking, PTA 03/17/2018, 10:52 AM  [facility] [address] [place], [place], [address] Phone: [phone]   Fax:  [phone]  Name: LEN AZEEZ MRN: [id number] Date of Birth: 07/18/1950

## 2018-03-20 ENCOUNTER — Ambulatory Visit: Payer: Medicare Other | Admitting: Physical Therapy

## 2018-03-20 DIAGNOSIS — M25511 Pain in right shoulder: Secondary | ICD-10-CM

## 2018-03-20 DIAGNOSIS — M25611 Stiffness of right shoulder, not elsewhere classified: Secondary | ICD-10-CM | POA: Diagnosis not present

## 2018-03-20 DIAGNOSIS — M6281 Muscle weakness (generalized): Secondary | ICD-10-CM | POA: Diagnosis not present

## 2018-03-20 NOTE — Therapy (Signed)
[facility] [place], [place], [address] Phone: [phone]   Fax:  [phone]  Physical Therapy Treatment  Patient Details  Name: Tony Hodges MRN: [id number] Date of Birth: [birth year] Referring Provider (PT): Sydnee Cabal, MD   Encounter Date: 03/20/2018  PT End of Session - 03/20/18 1418    Visit Number  19    Number of Visits  24    Date for PT Re-Evaluation  03/29/18    Authorization Type  FOTO; progress note every 10th visit     12th visit FOTO 55% limitation    PT Start Time  1345    PT Stop Time  1429    PT Time Calculation (min)  44 min    Activity Tolerance  Patient tolerated treatment well    Behavior During Therapy  [facility] for tasks assessed/performed       Past Medical History:  Diagnosis Date  . Diabetes mellitus   . Erectile dysfunction   . Hyperlipidemia   . Hypertension     Past Surgical History:  Procedure Laterality Date  . UMBILICAL HERNIA REPAIR  2010    There were no vitals filed for this visit.  Subjective Assessment - 03/20/18 1346    Subjective  "A little sore"    Pertinent History  HTN, DM, R RTC 01/24/2018    Limitations  Lifting;House hold activities    Diagnostic tests  MRI, X-Ray    Patient Stated Goals  improve movement, play golf    Currently in Pain?  Yes    Pain Score  1     Pain Location  Shoulder    Pain Orientation  Right    Pain Descriptors / Indicators  Tightness;Sore    Pain Type  Surgical pain    Pain Onset  More than a month ago    Pain Frequency  Intermittent    Aggravating Factors   prolong movement of shouler    Pain Relieving Factors  at rest         [facility] PT Assessment - 03/20/18 0001      AROM   AROM Assessment Site  Shoulder    Right/Left Shoulder  Right    Right Shoulder External Rotation  45 Degrees      PROM   PROM Assessment Site  Shoulder    Right/Left Shoulder  Right    Right Shoulder Flexion  144 Degrees    Right Shoulder External  Rotation  57 Degrees                   [name] - 03/20/18 0001      Shoulder Exercises: Standing   Other Standing Exercises  Wall ladder x10 reps (level 34)      Shoulder Exercises: Pulleys   Flexion  5 minutes      Shoulder Exercises: ROM/Strengthening   [place]  Standing flex, CW and CCW circles x30 reps each      Shoulder Exercises: IT sales professional  3 reps;30 seconds      Acupuncturist Location  R shoulder     Electrical Stimulation Action  premod    Electrical Stimulation Parameters  80-150hz  x45min    Electrical Stimulation Goals  Pain      Vasopneumatic   Number Minutes Vasopneumatic   15 minutes    Vasopnuematic Location   Shoulder    Vasopneumatic Pressure  Low      Manual  Therapy   Manual Therapy  Passive ROM    Passive ROM  PROM of R shoulder into flexion, ER, IR with gentle holds at end range               PT Short Term Goals - 02/02/18 1245      PT SHORT TERM GOAL #1   Title  STG=LTG        PT Long Term Goals - 02/15/18 1339      PT LONG TERM GOAL #1   Title  Patient will be independent with HEP and its progression    Time  4    Period  Weeks    Status  On-going      PT LONG TERM GOAL #2   Title  Patient will demonstrate 150+ degrees of right shoulder flexion AROM to improve ability to perform functional tasks.    Time  4    Period  Weeks    Status  On-going      PT LONG TERM GOAL #3   Title  Patient will demonstrate 60+ degrees of right shoulder external rotation AROM to improve ability to don/doff apparel.    Time  4    Period  Weeks    Status  On-going      PT LONG TERM GOAL #4   Title  Patient will demonstrate 4/5 or greater right shoulder MMT to improve stability during functional tasks.    Time  4    Period  Weeks    Status  On-going      PT LONG TERM GOAL #5   Title  Patient will demonstrate ability to perfrom all ADLs independently with pain  less than 3/10 in right shoulder.    Time  4    Period  Weeks    Status  On-going            Plan - 03/20/18 1419    Clinical Impression Statement  Patient tolerated treatment well today. Patient progressing with improved PROM for right shoulder flexion and ER. Patient doing well with his HEP and daily home stretches per reported. Patient progressing toward goals.     Rehab Potential  Excellent    Clinical Impairments Affecting Rehab Potential  surgery 01/24/18 8 weeks 03/21/18    PT Frequency  3x / week    PT Duration  4 weeks    PT Treatment/Interventions  Cryotherapy;Ultrasound;Moist Heat;ADLs/Self Care Home Management;Electrical Stimulation;Iontophoresis 4mg /ml Dexamethasone;Functional mobility training;Neuromuscular re-education;Therapeutic exercise;Therapeutic activities;Patient/family education;Manual techniques;Passive range of motion;Scar mobilization;Vasopneumatic Device    PT Next Visit Plan  Progress to lawnchair AROM progression and consider UBE /FOTO next treatment    Consulted and Agree with Plan of Care  Patient       Patient will benefit from skilled therapeutic intervention in order to improve the following deficits and impairments:  Pain, Decreased strength, Decreased range of motion, Impaired UE functional use  Visit Diagnosis: Stiffness of right shoulder, not elsewhere classified  Acute pain of right shoulder  Muscle weakness (generalized)     Problem List Patient Active Problem List   Diagnosis Date Noted  . BPH (benign prostatic hyperplasia) 11/25/2015  . Sprain and strain of metatarsophalangeal joint 04/02/2015  . Erectile dysfunction   . Diabetes mellitus ([place])   . Hyperlipidemia associated with type 2 diabetes mellitus ([place]) 05/25/2012  . HTN (hypertension) 05/25/2012    Kylyn Mcdade P, PTA 03/20/2018, 2:32 PM  [facility] [address] [place], [place], [address] Phone:  [phone]   Fax:   [phone]  Name: Tony Hodges MRN: [id number] Date of Birth: 1950/09/16

## 2018-03-22 ENCOUNTER — Ambulatory Visit: Payer: Medicare Other | Admitting: Physical Therapy

## 2018-03-22 ENCOUNTER — Other Ambulatory Visit: Payer: Self-pay

## 2018-03-22 DIAGNOSIS — M6281 Muscle weakness (generalized): Secondary | ICD-10-CM

## 2018-03-22 DIAGNOSIS — M25611 Stiffness of right shoulder, not elsewhere classified: Secondary | ICD-10-CM | POA: Diagnosis not present

## 2018-03-22 DIAGNOSIS — M25511 Pain in right shoulder: Secondary | ICD-10-CM

## 2018-03-22 NOTE — Therapy (Signed)
[facility] [place], [place], [address] Phone: [phone]   Fax:  [phone]  Physical Therapy Treatment  Patient Details  Name: Tony Hodges MRN: [id number] Date of Birth: November 26, 1950 Referring Provider (PT): Sydnee Cabal, MD   Encounter Date: 03/22/2018  PT End of Session - 03/22/18 1259    Visit Number  20    Number of Visits  24    Date for PT Re-Evaluation  03/29/18    Authorization Type  FOTO; progress note every 10th visit     12th visit FOTO 55% limitation    PT Start Time  1301    PT Stop Time  1353    PT Time Calculation (min)  52 min    Activity Tolerance  Patient tolerated treatment well    Behavior During Therapy  [facility] for tasks assessed/performed       Past Medical History:  Diagnosis Date  . Diabetes mellitus   . Erectile dysfunction   . Hyperlipidemia   . Hypertension     Past Surgical History:  Procedure Laterality Date  . UMBILICAL HERNIA REPAIR  2010    There were no vitals filed for this visit.  Subjective Assessment - 03/22/18 1259    Subjective  No new complaints.    Pertinent History  HTN, DM, R RTC 01/24/2018    Limitations  Lifting;House hold activities    Diagnostic tests  MRI, X-Ray    Patient Stated Goals  improve movement, play golf    Currently in Pain?  No/denies         [facility] PT Assessment - 03/22/18 0001      Assessment   Medical Diagnosis  right shoulder scope SAD/DCR/biceps tenodesis RTC repair    Referring Provider (PT)  Sydnee Cabal, MD    Onset Date/Surgical Date  01/24/18    Hand Dominance  Left    Next MD Visit  04/12/2018    Prior Therapy  no      Precautions   Precautions  Shoulder    Type of Shoulder Precautions  RTC repair; see media for RTC repair [place] orthopedics protocol      Observation/Other Assessments   Focus on Therapeutic Outcomes (FOTO)   34%, CJ status                   [name] - 03/22/18 0001       Shoulder Exercises: Supine   Flexion  AROM;Both;20 reps   lawnchair reclined   Diagonals  AROM;Right;20 reps    Diagonals Limitations  D2      Shoulder Exercises: Prone   Retraction  AROM;Right;20 reps    Extension  AROM;Right;20 reps      Shoulder Exercises: Sidelying   External Rotation  AROM;Right;20 reps      Shoulder Exercises: ROM/Strengthening   UBE (Upper Arm Bike)  90 RPM x8 min   forward/backward     Shoulder Exercises: Stretch   Corner Stretch  3 reps;30 seconds    Table Stretch - Flexion  Other (comment)   x20 reps     Modalities   Modalities  Electrical Stimulation;Vasopneumatic      Acupuncturist Location  R shoulder     Electrical Stimulation Action  Pre-Mod    Electrical Stimulation Parameters  80-150 hz x15 min    Electrical Stimulation Goals  Pain      Vasopneumatic   Number Minutes Vasopneumatic   15 minutes  Vasopnuematic Location   Shoulder    Vasopneumatic Pressure  Low    Vasopneumatic Temperature   34      Manual Therapy   Manual Therapy  Joint mobilization;Passive ROM    Joint Mobilization  Grade I-II R glenohumeral mobilizations in A/P/I directions for ROM/pain    Passive ROM  PROM of R shoulder into flexion, ER, IR with gentle holds at end range               PT Short Term Goals - 02/02/18 1245      PT SHORT TERM GOAL #1   Title  STG=LTG        PT Long Term Goals - 02/15/18 1339      PT LONG TERM GOAL #1   Title  Patient will be independent with HEP and its progression    Time  4    Period  Weeks    Status  On-going      PT LONG TERM GOAL #2   Title  Patient will demonstrate 150+ degrees of right shoulder flexion AROM to improve ability to perform functional tasks.    Time  4    Period  Weeks    Status  On-going      PT LONG TERM GOAL #3   Title  Patient will demonstrate 60+ degrees of right shoulder external rotation AROM to improve ability to don/doff apparel.    Time  4     Period  Weeks    Status  On-going      PT LONG TERM GOAL #4   Title  Patient will demonstrate 4/5 or greater right shoulder MMT to improve stability during functional tasks.    Time  4    Period  Weeks    Status  On-going      PT LONG TERM GOAL #5   Title  Patient will demonstrate ability to perfrom all ADLs independently with pain less than 3/10 in right shoulder.    Time  4    Period  Weeks    Status  On-going            Plan - 03/22/18 1347    Clinical Impression Statement  Patient tolerated today's treatment of progressions well with no complaints of any pain. Patient may require additional demonstration of table stretch with ball into flexion at sink to improve technique. Patient progressed to lawnchair position for R shoulder flexion with no abnormal compensatory strategy. R glenohumeral joint mobilizations completed and followed by PROM of R shoulder to improve ROM. Firm end feels and smooth arc of motion noted during PROM of R shoulder although anterior movement of R humeral head noted during PROM into ER. Normal modalities response noted following removal of the modalities.    Rehab Potential  Excellent    Clinical Impairments Affecting Rehab Potential  surgery 01/24/18 8 weeks 03/21/18    PT Frequency  3x / week    PT Duration  4 weeks    PT Treatment/Interventions  Cryotherapy;Ultrasound;Moist Heat;ADLs/Self Care Home Management;Electrical Stimulation;Iontophoresis 4mg /ml Dexamethasone;Functional mobility training;Neuromuscular re-education;Therapeutic exercise;Therapeutic activities;Patient/family education;Manual techniques;Passive range of motion;Scar mobilization;Vasopneumatic Device    PT Next Visit Plan  Progress to lawnchair AROM progression and consider UBE.    PT Home Exercise Plan  see patient education section    Consulted and Agree with Plan of Care  Patient       Patient will benefit from skilled therapeutic intervention in order to improve the following  deficits and impairments:  Pain, Decreased strength, Decreased range of motion, Impaired UE functional use  Visit Diagnosis: Stiffness of right shoulder, not elsewhere classified  Acute pain of right shoulder  Muscle weakness (generalized)     Problem List Patient Active Problem List   Diagnosis Date Noted  . BPH (benign prostatic hyperplasia) 11/25/2015  . Sprain and strain of metatarsophalangeal joint 04/02/2015  . Erectile dysfunction   . Diabetes mellitus ([place])   . Hyperlipidemia associated with type 2 diabetes mellitus ([place]) 05/25/2012  . HTN (hypertension) 05/25/2012    Standley Brooking, PTA 03/22/2018, 1:55 PM  [facility] [address] [place], [place], [address] Phone: [phone]   Fax:  [phone]  Name: Tony Hodges MRN: [id number] Date of Birth: 10-20-1950

## 2018-03-24 ENCOUNTER — Ambulatory Visit: Payer: Medicare Other | Admitting: Physical Therapy

## 2018-03-24 ENCOUNTER — Other Ambulatory Visit: Payer: Self-pay

## 2018-03-24 DIAGNOSIS — M25611 Stiffness of right shoulder, not elsewhere classified: Secondary | ICD-10-CM

## 2018-03-24 DIAGNOSIS — M6281 Muscle weakness (generalized): Secondary | ICD-10-CM | POA: Diagnosis not present

## 2018-03-24 DIAGNOSIS — M25511 Pain in right shoulder: Secondary | ICD-10-CM

## 2018-03-24 NOTE — Therapy (Signed)
[facility] [place], [place], [address] Phone: [phone]   Fax:  [phone]  Physical Therapy Treatment  Patient Details  Name: Tony Hodges MRN: [id number] Date of Birth: 1950-02-18 Referring Provider (PT): Sydnee Cabal, MD   Encounter Date: 03/24/2018  PT End of Session - 03/24/18 0949    Visit Number  21    Number of Visits  24    Date for PT Re-Evaluation  03/29/18    Authorization Type  FOTO; progress note every 10th visit     12th visit FOTO 55% limitation    PT Start Time  0900    PT Stop Time  0952    PT Time Calculation (min)  52 min    Activity Tolerance  Patient tolerated treatment well    Behavior During Therapy  [facility] for tasks assessed/performed       Past Medical History:  Diagnosis Date  . Diabetes mellitus   . Erectile dysfunction   . Hyperlipidemia   . Hypertension     Past Surgical History:  Procedure Laterality Date  . UMBILICAL HERNIA REPAIR  2010    There were no vitals filed for this visit.  Subjective Assessment - 03/24/18 0900    Subjective  Reports only minor pain upon arrival this morning.    Pertinent History  HTN, DM, R RTC 01/24/2018    Limitations  Lifting;House hold activities    Diagnostic tests  MRI, X-Ray    Patient Stated Goals  improve movement, play golf    Currently in Pain?  Yes    Pain Score  2     Pain Location  Shoulder    Pain Orientation  Right    Pain Descriptors / Indicators  Discomfort    Pain Type  Surgical pain    Pain Onset  More than a month ago    Pain Frequency  Intermittent         [facility] PT Assessment - 03/24/18 0001      Assessment   Medical Diagnosis  right shoulder scope SAD/DCR/biceps tenodesis RTC repair    Referring Provider (PT)  Sydnee Cabal, MD    Onset Date/Surgical Date  01/24/18    Hand Dominance  Left    Next MD Visit  04/12/2018    Prior Therapy  no      Precautions   Precautions  Shoulder    Type of Shoulder  Precautions  RTC repair; see media for RTC repair [place] orthopedics protocol                   [facility] Adult PT Treatment/Exercise - 03/24/18 0001      Shoulder Exercises: Supine   Protraction  AROM;Both;20 reps   lawnchair reclined   External Rotation  AROM;Both;20 reps   lawnchair reclined   Flexion  AROM;Both;20 reps   lawnchair reclined   Diagonals  AROM;Right;20 reps    Diagonals Limitations  D2; lawnchair reclined      Shoulder Exercises: Sidelying   External Rotation  AROM;Right;20 reps    Flexion  AROM;Right;20 reps      Shoulder Exercises: Standing   Internal Rotation  AAROM;Both;20 reps    Extension  AAROM;Both;20 reps      Shoulder Exercises: ROM/Strengthening   UBE (Upper Arm Bike)  90 RPM x8 min    Wall Wash  flex, CW and CCW circles x20 reps each      Shoulder Exercises: IT sales professional  3  reps;30 seconds    Table Stretch - Flexion  5 reps;10 seconds      Modalities   Modalities  Psychologist, educational Location  R shoulder     Electrical Stimulation Action  IFC    Electrical Stimulation Parameters  80-150 hz x15 min    Electrical Stimulation Goals  Pain      Vasopneumatic   Number Minutes Vasopneumatic   15 minutes    Vasopnuematic Location   Shoulder    Vasopneumatic Pressure  Low    Vasopneumatic Temperature   34      Manual Therapy   Manual Therapy  Joint mobilization;Passive ROM    Joint Mobilization  Grade I-II R glenohumeral mobilizations in A/P/I directions for ROM/pain    Passive ROM  PROM of R shoulder into flexion, ER, IR with gentle holds at end range               PT Short Term Goals - 02/02/18 1245      PT SHORT TERM GOAL #1   Title  STG=LTG        PT Long Term Goals - 02/15/18 1339      PT LONG TERM GOAL #1   Title  Patient will be independent with HEP and its progression    Time  4    Period  Weeks    Status  On-going      PT  LONG TERM GOAL #2   Title  Patient will demonstrate 150+ degrees of right shoulder flexion AROM to improve ability to perform functional tasks.    Time  4    Period  Weeks    Status  On-going      PT LONG TERM GOAL #3   Title  Patient will demonstrate 60+ degrees of right shoulder external rotation AROM to improve ability to don/doff apparel.    Time  4    Period  Weeks    Status  On-going      PT LONG TERM GOAL #4   Title  Patient will demonstrate 4/5 or greater right shoulder MMT to improve stability during functional tasks.    Time  4    Period  Weeks    Status  On-going      PT LONG TERM GOAL #5   Title  Patient will demonstrate ability to perfrom all ADLs independently with pain less than 3/10 in right shoulder.    Time  4    Period  Weeks    Status  On-going            Plan - 03/24/18 0950    Clinical Impression Statement  Patient presented in clinic with minor R shoulder discomfort. Patient able to continue AROM and AAROM exercises well with good technique. New antigravity exercises initated in SL to improve periscapular strength. Grade II-III R glenohumeral mobilizations in A/P/I directions followed by PROM of R shoulder into flexion, ER , IR. Firm end feels and smooth arc of motion noted with PROM of R shoulder. Normal modalities response noted following removal of the modalities.     Rehab Potential  Excellent    Clinical Impairments Affecting Rehab Potential  surgery 01/24/18 8 weeks 03/21/18    PT Frequency  3x / week    PT Duration  4 weeks    PT Treatment/Interventions  Cryotherapy;Ultrasound;Moist Heat;ADLs/Self Care Home Management;Electrical Stimulation;Iontophoresis 4mg /ml Dexamethasone;Functional mobility training;Neuromuscular re-education;Therapeutic exercise;Therapeutic activities;Patient/family education;Manual techniques;Passive range of  motion;Scar mobilization;Vasopneumatic Device    PT Next Visit Plan  Continue lawnchair AROM progression and consider  UBE.    PT Home Exercise Plan  see patient education section    Consulted and Agree with Plan of Care  Patient       Patient will benefit from skilled therapeutic intervention in order to improve the following deficits and impairments:  Pain, Decreased strength, Decreased range of motion, Impaired UE functional use  Visit Diagnosis: Stiffness of right shoulder, not elsewhere classified  Acute pain of right shoulder  Muscle weakness (generalized)     Problem List Patient Active Problem List   Diagnosis Date Noted  . BPH (benign prostatic hyperplasia) 11/25/2015  . Sprain and strain of metatarsophalangeal joint 04/02/2015  . Erectile dysfunction   . Diabetes mellitus ([place])   . Hyperlipidemia associated with type 2 diabetes mellitus ([place]) 05/25/2012  . HTN (hypertension) 05/25/2012    Standley Brooking, PTA 03/24/2018, 10:06 AM  [facility] [place], [place], [address] Phone: [phone]   Fax:  [phone]  Name: Tony Hodges MRN: [id number] Date of Birth: May 28, 1950

## 2018-03-27 ENCOUNTER — Other Ambulatory Visit: Payer: Self-pay

## 2018-03-27 ENCOUNTER — Ambulatory Visit: Payer: Medicare Other | Admitting: Physical Therapy

## 2018-03-27 DIAGNOSIS — M25611 Stiffness of right shoulder, not elsewhere classified: Secondary | ICD-10-CM

## 2018-03-27 DIAGNOSIS — M25511 Pain in right shoulder: Secondary | ICD-10-CM

## 2018-03-27 DIAGNOSIS — M6281 Muscle weakness (generalized): Secondary | ICD-10-CM | POA: Diagnosis not present

## 2018-03-27 NOTE — Therapy (Signed)
[facility] [place], [place], [address] Phone: [phone]   Fax:  [phone]  Physical Therapy Treatment  Patient Details  Name: Tony Hodges MRN: [id number] Date of Birth: 23-Nov-1950 Referring Provider (PT): Sydnee Cabal, MD   Encounter Date: 03/27/2018  PT End of Session - 03/27/18 0903    Visit Number  22    Number of Visits  24    Date for PT Re-Evaluation  03/29/18    Authorization Type  FOTO; progress note every 10th visit     12th visit FOTO 55% limitation    PT Start Time  0900    PT Stop Time  0958    PT Time Calculation (min)  58 min    Activity Tolerance  Patient tolerated treatment well    Behavior During Therapy  [facility] for tasks assessed/performed       Past Medical History:  Diagnosis Date  . Diabetes mellitus   . Erectile dysfunction   . Hyperlipidemia   . Hypertension     Past Surgical History:  Procedure Laterality Date  . UMBILICAL HERNIA REPAIR  2010    There were no vitals filed for this visit.  Subjective Assessment - 03/27/18 0902    Subjective   No new complaints upon arrival.    Pertinent History  HTN, DM, R RTC 01/24/2018    Limitations  Lifting;House hold activities    Diagnostic tests  MRI, X-Ray    Patient Stated Goals  improve movement, play golf    Currently in Pain?  Yes    Pain Score  2     Pain Location  Shoulder    Pain Orientation  Right    Pain Descriptors / Indicators  Discomfort    Pain Type  Surgical pain    Pain Onset  More than a month ago    Pain Frequency  Intermittent         [facility] PT Assessment - 03/27/18 0001      Assessment   Medical Diagnosis  right shoulder scope SAD/DCR/biceps tenodesis RTC repair    Referring Provider (PT)  Sydnee Cabal, MD    Onset Date/Surgical Date  01/24/18    Hand Dominance  Left    Next MD Visit  04/12/2018    Prior Therapy  no      Precautions   Precautions  Shoulder    Type of Shoulder Precautions  RTC repair;  see media for RTC repair [place] orthopedics protocol                   [facility] Adult PT Treatment/Exercise - 03/27/18 0001      Shoulder Exercises: Prone   Retraction  AROM;Right;20 reps    Extension  AROM;Right;20 reps      Shoulder Exercises: Standing   Protraction  Strengthening;Right;20 reps;Theraband    Theraband Level (Shoulder Protraction)  Level 1 (Yellow)    External Rotation  Strengthening;Right;20 reps;Theraband    Theraband Level (Shoulder External Rotation)  Level 1 (Yellow)    Internal Rotation  Strengthening;Right;20 reps;Theraband    Theraband Level (Shoulder Internal Rotation)  Level 1 (Yellow)    Flexion  AROM;Right;20 reps    ABduction  AROM;Right;20 reps    Extension  Strengthening;Right;20 reps;Theraband    Theraband Level (Shoulder Extension)  Level 1 (Yellow)    Row  Strengthening;Right;20 reps;Theraband    Theraband Level (Shoulder Row)  Level 1 (Yellow)    Other Standing Exercises  AROM L shoulder scaption  x20 reps      Shoulder Exercises: ROM/Strengthening   UBE (Upper Arm Bike)  90 RPM x8 min    Ball on Wall  Flex x10 reps      Shoulder Exercises: IT sales professional  3 reps;30 seconds      Modalities   Modalities  Psychologist, educational Location  R shoulder     Electrical Stimulation Action  IFC    Electrical Stimulation Parameters  80-150 hz x15 min    Electrical Stimulation Goals  Pain      Vasopneumatic   Number Minutes Vasopneumatic   15 minutes    Vasopnuematic Location   Shoulder    Vasopneumatic Pressure  Low    Vasopneumatic Temperature   63      Manual Therapy   Manual Therapy  Joint mobilization;Passive ROM    Joint Mobilization  Grade I-II R glenohumeral mobilizations in A/P/I directions for ROM/pain    Passive ROM  PROM of R shoulder into flexion, ER, IR with gentle holds at end range             PT Education - 03/27/18 0920    Education  Details  HEP- AROM flex, abd, resisted ext, ER, IR    Person(s) Educated  Patient    Methods  Explanation;Demonstration;Handout    Comprehension  Verbalized understanding       PT Short Term Goals - 02/02/18 1245      PT SHORT TERM GOAL #1   Title  STG=LTG        PT Long Term Goals - 03/27/18 0947      PT LONG TERM GOAL #1   Title  Patient will be independent with HEP and its progression    Time  4    Period  Weeks    Status  Achieved      PT LONG TERM GOAL #2   Title  Patient will demonstrate 150+ degrees of right shoulder flexion AROM to improve ability to perform functional tasks.    Time  4    Period  Weeks    Status  On-going      PT LONG TERM GOAL #3   Title  Patient will demonstrate 60+ degrees of right shoulder external rotation AROM to improve ability to don/doff apparel.    Time  4    Period  Weeks    Status  On-going      PT LONG TERM GOAL #4   Title  Patient will demonstrate 4/5 or greater right shoulder MMT to improve stability during functional tasks.    Time  4    Period  Weeks    Status  On-going      PT LONG TERM GOAL #5   Title  Patient will demonstrate ability to perfrom all ADLs independently with pain less than 3/10 in right shoulder.    Time  4    Period  Weeks    Status  Achieved            Plan - 03/27/18 0953    Clinical Impression Statement  Patient presented in clinic with only minimal R shoulder discomfort. Patient progressed to more AROM shoulder exercises and lightly resisted RW4. Patient educated against compensatory strategies with demo and VCs for all exercises. Patient provided new AROM or lightly resisted RW4 for home along with yellow theraband. Patient educated regarding technique and parameters and verbalized understanding. Grade I-II  glenohumeral joint mobilzations completed again today with no restrictions followed by PROM of R shoulder in all directions. Firm end feels and smooth arc of motion noted during PROM. Normal  modalities response noted following removal of the modalities.    Rehab Potential  Excellent    Clinical Impairments Affecting Rehab Potential  surgery 01/24/18 8 weeks 03/21/18    PT Frequency  3x / week    PT Duration  4 weeks    PT Treatment/Interventions  Cryotherapy;Ultrasound;Moist Heat;ADLs/Self Care Home Management;Electrical Stimulation;Iontophoresis 4mg /ml Dexamethasone;Functional mobility training;Neuromuscular re-education;Therapeutic exercise;Therapeutic activities;Patient/family education;Manual techniques;Passive range of motion;Scar mobilization;Vasopneumatic Device    PT Next Visit Plan  Progress with AROM and strengthening poer POC.    PT Home Exercise Plan  see patient education section    Consulted and Agree with Plan of Care  Patient       Patient will benefit from skilled therapeutic intervention in order to improve the following deficits and impairments:  Pain, Decreased strength, Decreased range of motion, Impaired UE functional use  Visit Diagnosis: Stiffness of right shoulder, not elsewhere classified  Acute pain of right shoulder  Muscle weakness (generalized)     Problem List Patient Active Problem List   Diagnosis Date Noted  . BPH (benign prostatic hyperplasia) 11/25/2015  . Sprain and strain of metatarsophalangeal joint 04/02/2015  . Erectile dysfunction   . Diabetes mellitus ([place])   . Hyperlipidemia associated with type 2 diabetes mellitus ([place]) 05/25/2012  . HTN (hypertension) 05/25/2012    Standley Brooking, PTA 03/27/2018, 10:06 AM  [facility] [place], [place], [address] Phone: [phone]   Fax:  [phone]  Name: Tony Hodges MRN: [id number] Date of Birth: 1950/01/31

## 2018-03-29 ENCOUNTER — Ambulatory Visit: Payer: Medicare Other | Admitting: Physical Therapy

## 2018-03-29 ENCOUNTER — Other Ambulatory Visit: Payer: Self-pay

## 2018-03-29 DIAGNOSIS — M25511 Pain in right shoulder: Secondary | ICD-10-CM | POA: Diagnosis not present

## 2018-03-29 DIAGNOSIS — M25611 Stiffness of right shoulder, not elsewhere classified: Secondary | ICD-10-CM

## 2018-03-29 DIAGNOSIS — M6281 Muscle weakness (generalized): Secondary | ICD-10-CM | POA: Diagnosis not present

## 2018-03-29 NOTE — Therapy (Signed)
[facility] [place], [place], [address] Phone: [phone]   Fax:  [phone]  Physical Therapy Treatment  Patient Details  Name: Tony Hodges MRN: [id number] Date of Birth: Oct 07, 1950 Referring Provider (PT): Sydnee Cabal, MD   Encounter Date: 03/29/2018  PT End of Session - 03/29/18 1310    Visit Number  23    Number of Visits  28    Date for PT Re-Evaluation  04/21/18    Authorization Type  FOTO; progress note every 10th visit     12th visit FOTO 55% limitation    PT Start Time  1301    PT Stop Time  1353    PT Time Calculation (min)  52 min    Activity Tolerance  Patient tolerated treatment well    Behavior During Therapy  [facility] for tasks assessed/performed       Past Medical History:  Diagnosis Date  . Diabetes mellitus   . Erectile dysfunction   . Hyperlipidemia   . Hypertension     Past Surgical History:  Procedure Laterality Date  . UMBILICAL HERNIA REPAIR  2010    There were no vitals filed for this visit.  Subjective Assessment - 03/29/18 1309    Subjective  Denies any complaints and even forgot taking tylenol this morning.    Pertinent History  HTN, DM, R RTC 01/24/2018    Limitations  Lifting;House hold activities    Diagnostic tests  MRI, X-Ray    Patient Stated Goals  improve movement, play golf    Currently in Pain?  No/denies         [facility] PT Assessment - 03/29/18 0001      Assessment   Medical Diagnosis  right shoulder scope SAD/DCR/biceps tenodesis RTC repair    Referring Provider (PT)  Sydnee Cabal, MD    Onset Date/Surgical Date  01/24/18    Hand Dominance  Left    Next MD Visit  04/12/2018    Prior Therapy  no      Precautions   Precautions  Shoulder    Type of Shoulder Precautions  RTC repair; see media for RTC repair [place] orthopedics protocol      ROM / Strength   AROM / PROM / Strength  AROM      AROM   Overall AROM   Deficits    AROM Assessment Site  Shoulder     Right/Left Shoulder  Right    Right Shoulder Flexion  136 Degrees    Right Shoulder Internal Rotation  60 Degrees    Right Shoulder External Rotation  60 Degrees                   [name] - 03/29/18 0001      Shoulder Exercises: Prone   Retraction  Strengthening;Right;20 reps;Weights    Retraction Weight (lbs)  1    Extension  Strengthening;Right;20 reps;Weights    Extension Weight (lbs)  1    Horizontal ABduction 1  Strengthening;Right;20 reps;Weights    Horizontal ABduction 1 Weight (lbs)  1      Shoulder Exercises: Standing   Protraction  Strengthening;Right;20 reps;Theraband    Theraband Level (Shoulder Protraction)  Level 1 (Yellow)    External Rotation  Strengthening;Right;20 reps;Theraband    Theraband Level (Shoulder External Rotation)  Level 1 (Yellow)    Internal Rotation  Strengthening;Right;20 reps;Theraband    Theraband Level (Shoulder Internal Rotation)  Level 1 (Yellow)    Flexion  Strengthening;Right;20 reps;Weights    Shoulder Flexion Weight (lbs)  1    ABduction  Strengthening;Right;20 reps;Weights    Shoulder ABduction Weight (lbs)  1    Extension  Strengthening;Right;20 reps;Theraband    Theraband Level (Shoulder Extension)  Level 1 (Yellow)    Row  Strengthening;Right;20 reps;Theraband    Theraband Level (Shoulder Row)  Level 1 (Yellow)    Other Standing Exercises  L shoulder scaption 1# x20 reps      Shoulder Exercises: ROM/Strengthening   UBE (Upper Arm Bike)  90 RPM x8 min    Ball on Wall  Flex x10 reps      Modalities   Modalities  Psychologist, educational Location  R shoulder     Electrical Stimulation Action  Pre-Mod    Electrical Stimulation Parameters  80-150 hz x15 min    Electrical Stimulation Goals  Pain      Vasopneumatic   Number Minutes Vasopneumatic   15 minutes    Vasopnuematic Location   Shoulder    Vasopneumatic Pressure  Low     Vasopneumatic Temperature   65      Manual Therapy   Manual Therapy  Joint mobilization;Passive ROM    Joint Mobilization  Grade I-II R glenohumeral mobilizations in A/P/I directions for ROM/pain    Passive ROM  PROM of R shoulder into flexion, ER, IR with gentle holds at end range               PT Short Term Goals - 02/02/18 1245      PT SHORT TERM GOAL #1   Title  STG=LTG        PT Long Term Goals - 03/29/18 1340      PT LONG TERM GOAL #1   Title  Patient will be independent with HEP and its progression    Time  4    Period  Weeks    Status  Achieved      PT LONG TERM GOAL #2   Title  Patient will demonstrate 150+ degrees of right shoulder flexion AROM to improve ability to perform functional tasks.    Time  4    Period  Weeks    Status  On-going      PT LONG TERM GOAL #3   Title  Patient will demonstrate 60+ degrees of right shoulder external rotation AROM to improve ability to don/doff apparel.    Time  4    Period  Weeks    Status  Achieved      PT LONG TERM GOAL #4   Title  Patient will demonstrate 4/5 or greater right shoulder MMT to improve stability during functional tasks.    Time  4    Period  Weeks    Status  On-going      PT LONG TERM GOAL #5   Title  Patient will demonstrate ability to perfrom all ADLs independently with pain less than 3/10 in right shoulder.    Time  4    Period  Weeks    Status  Achieved            Plan - 03/29/18 1342    Clinical Impression Statement  Patient presented in clinic with no complaints upon arrival. Patient weaning himself off tylenol use at home per patient report. Patient progressed through strengthening exercises with no complaints during therex. R GH joint mobilizations continued today and followed by PROM of R shoulder.  Patient demonstrating more anterior capsule tightness with GH mobilizations and reports of anterior tightness with corner stretch. Firm end feels and smooth arc of motion noted  during PROM in all directions. AROM measurements of R shoulder provided in today's note. Discomfort reported at end range of ER. Normal modalities response noted following removal of the modalities.    Rehab Potential  Excellent    Clinical Impairments Affecting Rehab Potential  surgery 01/24/18 8 weeks 03/21/18    PT Frequency  3x / week    PT Duration  4 weeks    PT Treatment/Interventions  Cryotherapy;Ultrasound;Moist Heat;ADLs/Self Care Home Management;Electrical Stimulation;Iontophoresis 4mg /ml Dexamethasone;Functional mobility training;Neuromuscular re-education;Therapeutic exercise;Therapeutic activities;Patient/family education;Manual techniques;Passive range of motion;Scar mobilization;Vasopneumatic Device    PT Next Visit Plan  Progress with AROM and strengthening poer POC.    PT Home Exercise Plan  see patient education section    Consulted and Agree with Plan of Care  Patient       Patient will benefit from skilled therapeutic intervention in order to improve the following deficits and impairments:  Pain, Decreased strength, Decreased range of motion, Impaired UE functional use  Visit Diagnosis: Stiffness of right shoulder, not elsewhere classified  Acute pain of right shoulder  Muscle weakness (generalized)     Problem List Patient Active Problem List   Diagnosis Date Noted  . BPH (benign prostatic hyperplasia) 11/25/2015  . Sprain and strain of metatarsophalangeal joint 04/02/2015  . Erectile dysfunction   . Diabetes mellitus ([place])   . Hyperlipidemia associated with type 2 diabetes mellitus ([place]) 05/25/2012  . HTN (hypertension) 05/25/2012    Standley Brooking, PTA 03/29/18 2:45 PM   [facility] [address] [place], [place], [address] Phone: [phone]   Fax:  [phone]  Name: Tony Hodges MRN: [id number] Date of Birth: [birth year]

## 2018-03-31 ENCOUNTER — Encounter: Payer: Medicare Other | Admitting: Physical Therapy

## 2018-04-03 ENCOUNTER — Ambulatory Visit: Payer: Medicare Other | Admitting: Physical Therapy

## 2018-04-05 ENCOUNTER — Encounter: Payer: Medicare Other | Admitting: Physical Therapy

## 2018-04-05 ENCOUNTER — Ambulatory Visit: Payer: Medicare Other | Admitting: Family Medicine

## 2018-04-07 ENCOUNTER — Encounter: Payer: Medicare Other | Admitting: Physical Therapy

## 2018-04-11 ENCOUNTER — Encounter: Payer: Self-pay | Admitting: Physical Therapy

## 2018-04-11 ENCOUNTER — Other Ambulatory Visit: Payer: Self-pay

## 2018-04-11 ENCOUNTER — Ambulatory Visit: Payer: Medicare Other | Admitting: Physical Therapy

## 2018-04-11 DIAGNOSIS — M6281 Muscle weakness (generalized): Secondary | ICD-10-CM | POA: Diagnosis not present

## 2018-04-11 DIAGNOSIS — M25611 Stiffness of right shoulder, not elsewhere classified: Secondary | ICD-10-CM

## 2018-04-11 DIAGNOSIS — M25511 Pain in right shoulder: Secondary | ICD-10-CM

## 2018-04-11 NOTE — Therapy (Addendum)
[facility] [place], [place], [address] Phone: [phone]   Fax:  [phone]  Physical Therapy Treatment  Patient Details  Name: Tony Hodges MRN: [id number] Date of Birth: [birth year] Referring Provider (PT): Sydnee Cabal, MD   Encounter Date: 04/11/2018  PT End of Session - 04/11/18 1108    Visit Number  24    Number of Visits  28    Date for PT Re-Evaluation  04/21/18    Authorization Type  FOTO; progress note every 10th visit     12th visit FOTO 55% limitation    PT Start Time  1105    PT Stop Time  1209    PT Time Calculation (min)  64 min    Activity Tolerance  Patient tolerated treatment well    Behavior During Therapy  [facility] for tasks assessed/performed       Past Medical History:  Diagnosis Date  . Diabetes mellitus   . Erectile dysfunction   . Hyperlipidemia   . Hypertension     Past Surgical History:  Procedure Laterality Date  . UMBILICAL HERNIA REPAIR  2010    There were no vitals filed for this visit.  Subjective Assessment - 04/11/18 1112    Subjective  COVID-19 screening performed prior to patient entering the building. Patient arrives with minimal reports of pain; not enough to take a tylenol.    Pertinent History  HTN, DM, R RTC 01/24/2018    Limitations  Lifting;House hold activities    Diagnostic tests  MRI, X-Ray    Patient Stated Goals  improve movement, play golf    Currently in Pain?  Yes    Pain Score  1     Pain Location  Shoulder    Pain Orientation  Right    Pain Descriptors / Indicators  Discomfort    Pain Type  Surgical pain    Pain Onset  More than a month ago    Pain Frequency  Intermittent         [facility] PT Assessment - 04/11/18 0001      Assessment   Medical Diagnosis  right shoulder scope SAD/DCR/biceps tenodesis RTC repair    Referring Provider (PT)  Sydnee Cabal, MD    Onset Date/Surgical Date  01/24/18    Hand Dominance  Left    Next MD Visit  04/12/2018     Prior Therapy  no      Precautions   Precautions  Shoulder    Type of Shoulder Precautions  RTC repair; see media for RTC repair [place] orthopedics protocol      ROM / Strength   AROM / PROM / Strength  AROM;Strength      AROM   Overall AROM   Deficits    Right Shoulder Flexion  148 Degrees   standing   Right Shoulder ABduction  124 Degrees   standing   Right Shoulder Internal Rotation  63 Degrees    Right Shoulder External Rotation  59 Degrees      PROM   Overall PROM   Deficits    Right Shoulder Flexion  156 Degrees    Right Shoulder ABduction  130 Degrees      Strength   Overall Strength  Deficits    Strength Assessment Site  Shoulder    Right/Left Shoulder  Right    Right Shoulder Flexion  3+/5    Right Shoulder ABduction  3-/5    Right Shoulder Internal Rotation  3+/5  Right Shoulder External Rotation  3+/5                   [name] - 04/11/18 0001      Shoulder Exercises: Standing   Protraction  Strengthening;Right;20 reps;10 reps;Theraband    Theraband Level (Shoulder Protraction)  Level 2 (Red)    External Rotation  Strengthening;Right;20 reps;10 reps;Theraband    Theraband Level (Shoulder External Rotation)  Level 2 (Red)    Internal Rotation  Strengthening;Right;20 reps;10 reps;Theraband    Theraband Level (Shoulder Internal Rotation)  Level 2 (Red)    Flexion  Strengthening;Right;20 reps;Weights    Shoulder Flexion Weight (lbs)  2    ABduction  Strengthening;Right;20 reps;Weights    Shoulder ABduction Weight (lbs)  2    Extension  Strengthening;Right;20 reps;10 reps;Theraband    Theraband Level (Shoulder Extension)  Level 2 (Red)    Row  Strengthening;Right;20 reps;Theraband;10 reps    Theraband Level (Shoulder Row)  Level 2 (Red)    Other Standing Exercises  standing AAROM with PVC extension,  IR and horizontal adduction x30 each    Other Standing Exercises   L shoulder scaption #2 x30, standing scapular wall  slides x30       Shoulder Exercises: ROM/Strengthening   UBE (Upper Arm Bike)  90 RPM x8 min      Modalities   Modalities  Electrical Stimulation;Vasopneumatic      Electrical Stimulation   Electrical Stimulation Location  R shoulder    Electrical Stimulation Action  Pre-mod    Electrical Stimulation Parameters  80-150 hz x15 mins    Electrical Stimulation Goals  Pain      Vasopneumatic   Number Minutes Vasopneumatic   15 minutes    Vasopnuematic Location   Shoulder    Vasopneumatic Pressure  Low    Vasopneumatic Temperature   34      Manual Therapy   Manual Therapy  Joint mobilization;Passive ROM    Joint Mobilization  Grade I-II R glenohumeral mobilizations in A/P/I directions for ROM/pain    Passive ROM  PROM of R shoulder into flexion and abduction gentle holds at end range             PT Education - 04/11/18 1235    Education Details  AAROM shoulder extension, shoulder adduction, and IR, scapular wall slides, push ups    Person(s) Educated  Patient    Methods  Explanation;Demonstration;Handout    Comprehension  Verbalized understanding       PT Short Term Goals - 02/02/18 1245      PT SHORT TERM GOAL #1   Title  STG=LTG        PT Long Term Goals - 04/11/18 1158      PT LONG TERM GOAL #1   Title  Patient will be independent with HEP and its progression    Time  4    Period  Weeks    Status  Achieved      PT LONG TERM GOAL #2   Title  Patient will demonstrate 150+ degrees of right shoulder flexion AROM to improve ability to perform functional tasks.    Time  4    Period  Weeks    Status  On-going      PT LONG TERM GOAL #3   Title  Patient will demonstrate 60+ degrees of right shoulder external rotation AROM to improve ability to don/doff apparel.    Time  4    Period  Weeks  Status  Achieved      PT LONG TERM GOAL #4   Title  Patient will demonstrate 4/5 or greater right shoulder MMT to improve stability during functional tasks.    Time  4     Period  Weeks    Status  On-going      PT LONG TERM GOAL #5   Title  Patient will demonstrate ability to perfrom all ADLs independently with pain less than 3/10 in right shoulder.    Time  4    Period  Weeks    Status  Achieved            Plan - 04/11/18 1158    Clinical Impression Statement  Patient was able to tolerate treatment well and was able to progress into strengthening exercises well with minimal reports of increased pain. Patient noted with improved AROM and PROM yet goals are ongoing at this time. Patient is able to wean away from over the counter tylenol. Smooth arc of motion noted with PROM with ongoing tightness in abduction. Patient provided with new HEP to improve scapular stabilization as well as behind the back IR ROM. Normal response to modalities upon reomval.     Clinical Decision Making  Low    Rehab Potential  Excellent    Clinical Impairments Affecting Rehab Potential  surgery 01/24/18 11 weeks 04/11/18    PT Frequency  3x / week    PT Duration  4 weeks    PT Treatment/Interventions  Cryotherapy;Ultrasound;Moist Heat;ADLs/Self Care Home Management;Electrical Stimulation;Iontophoresis 4mg /ml Dexamethasone;Functional mobility training;Neuromuscular re-education;Therapeutic exercise;Therapeutic activities;Patient/family education;Manual techniques;Passive range of motion;Scar mobilization;Vasopneumatic Device    PT Next Visit Plan  Progress with AROM and strengthening per POC. MD note sent for 04/12/2018 follow up.     Consulted and Agree with Plan of Care  Patient       Patient will benefit from skilled therapeutic intervention in order to improve the following deficits and impairments:  Pain, Decreased strength, Decreased range of motion, Impaired UE functional use  Visit Diagnosis: Stiffness of right shoulder, not elsewhere classified  Acute pain of right shoulder  Muscle weakness (generalized)     Problem List Patient Active Problem List   Diagnosis  Date Noted  . BPH (benign prostatic hyperplasia) 11/25/2015  . Sprain and strain of metatarsophalangeal joint 04/02/2015  . Erectile dysfunction   . Diabetes mellitus ([place])   . Hyperlipidemia associated with type 2 diabetes mellitus ([place]) 05/25/2012  . HTN (hypertension) 05/25/2012   [name], PT, DPT 04/11/2018, 12:36 PM  [facility] [address] [place], [place], [address] Phone: [phone]   Fax:  [phone]  Name: Tony Hodges MRN: [id number] Date of Birth: 07-29-1950

## 2018-04-14 ENCOUNTER — Ambulatory Visit: Payer: Medicare Other | Attending: Specialist | Admitting: Physical Therapy

## 2018-04-14 ENCOUNTER — Other Ambulatory Visit: Payer: Self-pay

## 2018-04-14 DIAGNOSIS — M25611 Stiffness of right shoulder, not elsewhere classified: Secondary | ICD-10-CM

## 2018-04-14 DIAGNOSIS — M25511 Pain in right shoulder: Secondary | ICD-10-CM

## 2018-04-14 DIAGNOSIS — M6281 Muscle weakness (generalized): Secondary | ICD-10-CM | POA: Diagnosis not present

## 2018-04-14 NOTE — Therapy (Signed)
[facility] [place], [place], [address] Phone: [phone]   Fax:  [phone]  Physical Therapy Treatment  Patient Details  Name: Tony Hodges MRN: [id number] Date of Birth: May 23, 1950 Referring Provider (PT): Sydnee Cabal, MD   Encounter Date: 04/14/2018  PT End of Session - 04/14/18 1111    Visit Number  25    Number of Visits  28    Date for PT Re-Evaluation  04/21/18    Authorization Type  FOTO; progress note every 10th visit     12th visit FOTO 55% limitation    PT Start Time  1110    PT Stop Time  1208    PT Time Calculation (min)  58 min    Activity Tolerance  Patient tolerated treatment well    Behavior During Therapy  [facility] for tasks assessed/performed       Past Medical History:  Diagnosis Date  . Diabetes mellitus   . Erectile dysfunction   . Hyperlipidemia   . Hypertension     Past Surgical History:  Procedure Laterality Date  . UMBILICAL HERNIA REPAIR  2010    There were no vitals filed for this visit.  Subjective Assessment - 04/14/18 1110    Subjective  COVID-19 screening performed prior to patient entering the building. Patient reports feeling good with no pain in his shoulder. Patient also stated his follow up appointment went well and he can return to chipping and putting golf balls in two weeks.     Pertinent History  HTN, DM, R RTC 01/24/2018    Limitations  Lifting;House hold activities    Diagnostic tests  MRI, X-Ray    Patient Stated Goals  improve movement, play golf    Currently in Pain?  No/denies         [facility] PT Assessment - 04/14/18 0001      Assessment   Medical Diagnosis  right shoulder scope SAD/DCR/biceps tenodesis RTC repair    Referring Provider (PT)  Sydnee Cabal, MD    Onset Date/Surgical Date  01/24/18    Hand Dominance  Left    Next MD Visit  05/24/2018    Prior Therapy  no      Precautions   Precautions  Shoulder    Type of Shoulder Precautions  RTC  repair; see media for RTC repair [place] orthopedics protocol      Observation/Other Assessments   Focus on Therapeutic Outcomes (FOTO)   35%                   [name] - 04/14/18 0001      Shoulder Exercises: Supine   Protraction  Strengthening;Right;20 reps;Weights    Protraction Weight (lbs)  2    Horizontal ABduction  Strengthening;Right;20 reps    Horizontal ABduction Weight (lbs)  2    Flexion  Strengthening;Right;20 reps    Shoulder Flexion Weight (lbs)  2    Diagonals  AROM;Right;20 reps    Diagonals Weight (lbs)  2      Shoulder Exercises: Standing   Protraction  Strengthening;Right;20 reps;10 reps;Theraband    Theraband Level (Shoulder Protraction)  Level 2 (Red)    External Rotation  Strengthening;Right;20 reps;10 reps;Theraband    Theraband Level (Shoulder External Rotation)  Level 2 (Red)    Internal Rotation  Strengthening;Right;20 reps;10 reps;Theraband    Theraband Level (Shoulder Internal Rotation)  Level 2 (Red)    Extension  Strengthening;Right;20 reps;10 reps;Theraband    Theraband Level (  Shoulder Extension)  Level 2 (Red)    Diagonals  Strengthening;Both;20 reps    Diagonals Limitations  Pink XTS chop/lift x20      Shoulder Exercises: ROM/Strengthening   UBE (Upper Arm Bike)  90 RPM x8 min    Wall Pushups  20 reps      Modalities   Modalities  Electrical Stimulation;Vasopneumatic      Electrical Stimulation   Electrical Stimulation Location  R shoulder    Electrical Stimulation Action  pre-mod    Electrical Stimulation Parameters  80-150 hz x15 mins    Electrical Stimulation Goals  Pain      Vasopneumatic   Number Minutes Vasopneumatic   15 minutes    Vasopnuematic Location   Shoulder    Vasopneumatic Pressure  Low    Vasopneumatic Temperature   34      Manual Therapy   Manual Therapy  Joint mobilization;Passive ROM    Joint Mobilization  Grade I-II R glenohumeral mobilizations in A/P/I directions for ROM/pain     Passive ROM  PROM of R shoulder into flexion and abduction gentle holds at end range             PT Education - 04/14/18 1236    Education Details  Supine flexion horizontal abduction, sleeper stretch, IR towel stretch    Person(s) Educated  Patient    Methods  Explanation;Demonstration;Handout    Comprehension  Verbalized understanding;Returned demonstration       PT Short Term Goals - 02/02/18 1245      PT SHORT TERM GOAL #1   Title  STG=LTG        PT Long Term Goals - 04/11/18 1158      PT LONG TERM GOAL #1   Title  Patient will be independent with HEP and its progression    Time  4    Period  Weeks    Status  Achieved      PT LONG TERM GOAL #2   Title  Patient will demonstrate 150+ degrees of right shoulder flexion AROM to improve ability to perform functional tasks.    Time  4    Period  Weeks    Status  On-going      PT LONG TERM GOAL #3   Title  Patient will demonstrate 60+ degrees of right shoulder external rotation AROM to improve ability to don/doff apparel.    Time  4    Period  Weeks    Status  Achieved      PT LONG TERM GOAL #4   Title  Patient will demonstrate 4/5 or greater right shoulder MMT to improve stability during functional tasks.    Time  4    Period  Weeks    Status  On-going      PT LONG TERM GOAL #5   Title  Patient will demonstrate ability to perfrom all ADLs independently with pain less than 3/10 in right shoulder.    Time  4    Period  Weeks    Status  Achieved            Plan - 04/14/18 1229    Clinical Impression Statement  Patient responded well to new TEs with reports of fatigue towards end of session. Patient and PT discussed various movements required for returning to golf. An updated HEP was provided. Normal response to modalities upon removal.     Stability/Clinical Decision Making  Stable/Uncomplicated    Clinical Decision Making  Low  Rehab Potential  Excellent    Clinical Impairments Affecting Rehab  Potential  surgery 01/24/18 11 weeks 04/11/18    PT Frequency  3x / week    PT Duration  4 weeks    PT Treatment/Interventions  Cryotherapy;Ultrasound;Moist Heat;ADLs/Self Care Home Management;Electrical Stimulation;Iontophoresis 4mg /ml Dexamethasone;Functional mobility training;Neuromuscular re-education;Therapeutic exercise;Therapeutic activities;Patient/family education;Manual techniques;Passive range of motion;Scar mobilization;Vasopneumatic Device    PT Next Visit Plan  body blade, dynamic strengthening Progress with AROM and strengthening per POC. MD note sent for 04/12/2018 follow up.     Consulted and Agree with Plan of Care  Patient       Patient will benefit from skilled therapeutic intervention in order to improve the following deficits and impairments:  Pain, Decreased strength, Decreased range of motion, Impaired UE functional use  Visit Diagnosis: Stiffness of right shoulder, not elsewhere classified  Acute pain of right shoulder  Muscle weakness (generalized)     Problem List Patient Active Problem List   Diagnosis Date Noted  . BPH (benign prostatic hyperplasia) 11/25/2015  . Sprain and strain of metatarsophalangeal joint 04/02/2015  . Erectile dysfunction   . Diabetes mellitus ([place])   . Hyperlipidemia associated with type 2 diabetes mellitus ([place]) 05/25/2012  . HTN (hypertension) 05/25/2012    [name], PT, DPT 04/14/2018, 12:37 PM  [facility] [address] [place], [place], [address] Phone: [phone]   Fax:  [phone]  Name: Tony Hodges MRN: [id number] Date of Birth: 13-Mar-1950

## 2018-04-17 ENCOUNTER — Ambulatory Visit: Payer: Medicare Other | Admitting: Physical Therapy

## 2018-04-17 ENCOUNTER — Other Ambulatory Visit: Payer: Self-pay

## 2018-04-17 DIAGNOSIS — M6281 Muscle weakness (generalized): Secondary | ICD-10-CM

## 2018-04-17 DIAGNOSIS — M25611 Stiffness of right shoulder, not elsewhere classified: Secondary | ICD-10-CM

## 2018-04-17 DIAGNOSIS — M25511 Pain in right shoulder: Secondary | ICD-10-CM | POA: Diagnosis not present

## 2018-04-17 NOTE — Therapy (Signed)
[facility] [place], [place], [address] Phone: [phone]   Fax:  [phone]  Physical Therapy Treatment  Patient Details  Name: Tony Hodges MRN: [id number] Date of Birth: [birth year] Referring Provider (PT): Sydnee Cabal, MD   Encounter Date: 04/17/2018  PT End of Session - 04/17/18 0947    Visit Number  26    Number of Visits  28    Date for PT Re-Evaluation  04/21/18    Authorization Type  FOTO; progress note every 10th visit     12th visit FOTO 55% limitation    PT Start Time  0943    PT Stop Time  1047    PT Time Calculation (min)  64 min    Activity Tolerance  Patient tolerated treatment well    Behavior During Therapy  [facility] for tasks assessed/performed       Past Medical History:  Diagnosis Date  . Diabetes mellitus   . Erectile dysfunction   . Hyperlipidemia   . Hypertension     Past Surgical History:  Procedure Laterality Date  . UMBILICAL HERNIA REPAIR  2010    There were no vitals filed for this visit.  Subjective Assessment - 04/17/18 0947    Subjective  COVID-19 screening performed prior to patient entering the building. Patient reported no new complaints    Pertinent History  HTN, DM, R RTC 01/24/2018    Limitations  Lifting;House hold activities    Diagnostic tests  MRI, X-Ray    Patient Stated Goals  improve movement, play golf    Currently in Pain?  No/denies         [facility] PT Assessment - 04/17/18 0001      Assessment   Medical Diagnosis  right shoulder scope SAD/DCR/biceps tenodesis RTC repair    Referring Provider (PT)  Sydnee Cabal, MD    Onset Date/Surgical Date  01/24/18    Hand Dominance  Left    Next MD Visit  05/24/2018    Prior Therapy  no      Precautions   Precautions  Shoulder    Type of Shoulder Precautions  RTC repair; see media for RTC repair [place] orthopedics protocol                   [facility] Adult PT Treatment/Exercise - 04/17/18 0001      Shoulder Exercises: Standing   Protraction  Strengthening;Right;20 reps;10 reps;Theraband    Theraband Level (Shoulder Protraction)  Level 3 (Green)    External Rotation  Strengthening;Right;20 reps;10 reps;Theraband    Theraband Level (Shoulder External Rotation)  Level 3 (Green)    Internal Rotation  Strengthening;Right;20 reps;10 reps;Theraband    Theraband Level (Shoulder Internal Rotation)  Level 3 (Green)    Extension  Strengthening;Right;20 reps;10 reps;Theraband    Theraband Level (Shoulder Extension)  Level 3 (Green)    Row  Strengthening;Right;20 reps;Theraband;10 reps    Theraband Level (Shoulder Row)  Level 3 (Green)    Diagonals  Strengthening;Both;20 reps    Diagonals Limitations  Pink XTS, D1 flexion; followed by D2 flexion     Other Standing Exercises  thoracic rotation, with support of plinth x20 each followed by simulated golf swings with PVC x20      Shoulder Exercises: ROM/Strengthening   UBE (Upper Arm Bike)  60 RPM x8 mins    Other ROM/Strengthening Exercises  Pink XTS row/ext x20      Shoulder Exercises: Stretch   Other Shoulder Stretches  sleeper stretch  1x30 seconds      Electrical Stimulation   Electrical Stimulation Location  R shoulder    Electrical Stimulation Action  pre-mod    Electrical Stimulation Parameters  80-150 hz x15 mins    Electrical Stimulation Goals  Pain      Vasopneumatic   Number Minutes Vasopneumatic   15 minutes    Vasopnuematic Location   Shoulder    Vasopneumatic Pressure  Low    Vasopneumatic Temperature   34               PT Short Term Goals - 02/02/18 1245      PT SHORT TERM GOAL #1   Title  STG=LTG        PT Long Term Goals - 04/11/18 1158      PT LONG TERM GOAL #1   Title  Patient will be independent with HEP and its progression    Time  4    Period  Weeks    Status  Achieved      PT LONG TERM GOAL #2   Title  Patient will demonstrate 150+ degrees of right shoulder flexion AROM to improve ability to  perform functional tasks.    Time  4    Period  Weeks    Status  On-going      PT LONG TERM GOAL #3   Title  Patient will demonstrate 60+ degrees of right shoulder external rotation AROM to improve ability to don/doff apparel.    Time  4    Period  Weeks    Status  Achieved      PT LONG TERM GOAL #4   Title  Patient will demonstrate 4/5 or greater right shoulder MMT to improve stability during functional tasks.    Time  4    Period  Weeks    Status  On-going      PT LONG TERM GOAL #5   Title  Patient will demonstrate ability to perfrom all ADLs independently with pain less than 3/10 in right shoulder.    Time  4    Period  Weeks    Status  Achieved            Plan - 04/17/18 1059    Clinical Impression Statement  Patient was able to tolerate progression of treatment well with no reports of fatigue just some tightness at end ranges. More TEs added to improve return to golf and dynamic stability. Patient demonstrated good form with all exercises. Normal response to modalities upon removal.     Stability/Clinical Decision Making  Stable/Uncomplicated    Clinical Decision Making  Low    Rehab Potential  Excellent    Clinical Impairments Affecting Rehab Potential  surgery 01/24/18 11 weeks 04/11/18    PT Frequency  3x / week    PT Duration  4 weeks    PT Treatment/Interventions  Cryotherapy;Ultrasound;Moist Heat;ADLs/Self Care Home Management;Electrical Stimulation;Iontophoresis 4mg /ml Dexamethasone;Functional mobility training;Neuromuscular re-education;Therapeutic exercise;Therapeutic activities;Patient/family education;Manual techniques;Passive range of motion;Scar mobilization;Vasopneumatic Device    PT Next Visit Plan  body blade, dynamic strengthening Progress with AROM and strengthening per POC. MD note sent for 04/12/2018 follow up.     Consulted and Agree with Plan of Care  Patient       Patient will benefit from skilled therapeutic intervention in order to improve the  following deficits and impairments:  Pain, Decreased strength, Decreased range of motion, Impaired UE functional use  Visit Diagnosis: Stiffness of right shoulder, not elsewhere classified  Acute pain of right shoulder  Muscle weakness (generalized)     Problem List Patient Active Problem List   Diagnosis Date Noted  . BPH (benign prostatic hyperplasia) 11/25/2015  . Sprain and strain of metatarsophalangeal joint 04/02/2015  . Erectile dysfunction   . Diabetes mellitus ([place])   . Hyperlipidemia associated with type 2 diabetes mellitus ([place]) 05/25/2012  . HTN (hypertension) 05/25/2012   [name], PT, DPT 04/17/2018, 11:02 AM  [facility] [address] [place], [place], [address] Phone: [phone]   Fax:  [phone]  Name: Tony Hodges MRN: [id number] Date of Birth: June 18, 1950

## 2018-04-18 ENCOUNTER — Other Ambulatory Visit: Payer: Self-pay | Admitting: Family Medicine

## 2018-04-19 ENCOUNTER — Other Ambulatory Visit: Payer: Self-pay

## 2018-04-19 ENCOUNTER — Encounter: Payer: Self-pay | Admitting: Physical Therapy

## 2018-04-19 ENCOUNTER — Ambulatory Visit: Payer: Medicare Other | Admitting: Physical Therapy

## 2018-04-19 DIAGNOSIS — M6281 Muscle weakness (generalized): Secondary | ICD-10-CM | POA: Diagnosis not present

## 2018-04-19 DIAGNOSIS — M25511 Pain in right shoulder: Secondary | ICD-10-CM | POA: Diagnosis not present

## 2018-04-19 DIAGNOSIS — M25611 Stiffness of right shoulder, not elsewhere classified: Secondary | ICD-10-CM | POA: Diagnosis not present

## 2018-04-19 MED ORDER — GLUCOSE BLOOD VI STRP: ORAL_STRIP | 4 refills | Status: DC

## 2018-04-19 NOTE — Therapy (Signed)
[facility] [place], [place], [address] Phone: [phone]   Fax:  [phone]  Physical Therapy Treatment  Patient Details  Name: Tony Hodges MRN: [id number] Date of Birth: [birth year] Referring Provider (PT): Sydnee Cabal, MD   Encounter Date: 04/19/2018  PT End of Session - 04/19/18 1239    Visit Number  27    Number of Visits  28    Date for PT Re-Evaluation  04/21/18    Authorization Type  FOTO; progress note every 10th visit     12th visit FOTO 55% limitation    PT Start Time  1235    PT Stop Time  1324    PT Time Calculation (min)  49 min    Activity Tolerance  Patient tolerated treatment well    Behavior During Therapy  [facility] for tasks assessed/performed       Past Medical History:  Diagnosis Date  . Diabetes mellitus   . Erectile dysfunction   . Hyperlipidemia   . Hypertension     Past Surgical History:  Procedure Laterality Date  . UMBILICAL HERNIA REPAIR  2010    There were no vitals filed for this visit.  Subjective Assessment - 04/19/18 1238    Subjective  COVID-19 screening performed prior to patient entering the building. Patient reported no new complaints    Pertinent History  HTN, DM, R RTC 01/24/2018    Limitations  Lifting;House hold activities    Diagnostic tests  MRI, X-Ray    Patient Stated Goals  improve movement, play golf    Currently in Pain?  Yes    Pain Score  1     Pain Location  Shoulder    Pain Orientation  Right    Pain Descriptors / Indicators  Discomfort    Pain Type  Surgical pain    Pain Onset  More than a month ago         [facility] PT Assessment - 04/19/18 0001      Assessment   Medical Diagnosis  right shoulder scope SAD/DCR/biceps tenodesis RTC repair    Referring Provider (PT)  Sydnee Cabal, MD    Onset Date/Surgical Date  01/24/18    Hand Dominance  Left    Next MD Visit  05/24/2018    Prior Therapy  no      Precautions   Precautions  Shoulder    Type  of Shoulder Precautions  RTC repair; see media for RTC repair [place] orthopedics protocol                   [name] - 04/19/18 0001      Shoulder Exercises: Supine   Other Supine Exercises  R shoulder A-Z 4# x1 rep      Shoulder Exercises: Prone   Retraction  Strengthening;Right;Weights;Limitations    Retraction Weight (lbs)  4    Retraction Limitations  3x10 reps    Extension  Strengthening;Right;Weights;Limitations    Extension Weight (lbs)  4    Extension Limitations  3x10 reps    Horizontal ABduction 1  Strengthening;Right;Weights;Limitations    Horizontal ABduction 1 Weight (lbs)  4    Horizontal ABduction 1 Limitations  3x10 reps      Shoulder Exercises: Sidelying   External Rotation  Strengthening;Right;20 reps;Weights    External Rotation Weight (lbs)  3      Shoulder Exercises: Standing   Protraction  Strengthening;Right;Theraband;Limitations    Theraband Level (Shoulder Protraction)  Level 3 (  Green)    Protraction Limitations  3x10 reps    Transport planner;Theraband;Limitations    Theraband Level (Shoulder External Rotation)  Level 3 (Green)    External Rotation Limitations  3x10 reps    Internal Rotation  Strengthening;Right;Theraband;Limitations    Theraband Level (Shoulder Internal Rotation)  Level 3 (Green)    Internal Rotation Limitations  3x10 reps    Flexion  Strengthening;Right;20 reps;Weights    Shoulder Flexion Weight (lbs)  4    ABduction  Strengthening;Right;20 reps;Weights    Shoulder ABduction Weight (lbs)  4    Extension  Strengthening;Both;Limitations    Extension Limitations  3x10 reps pink XTS    Row  Strengthening;Right;Theraband;Limitations    Theraband Level (Shoulder Row)  Level 3 (Green)    Row Limitations  3x10 reps    Diagonals  Strengthening;Right;Theraband;Limitations    Diagonals Limitations  3x10 reps each of RUE D1 and D2    Other Standing Exercises  B chop wood pink XTSx20  reps    Other Standing Exercises  R shoulder scaption 4# x20 reps      Shoulder Exercises: ROM/Strengthening   UBE (Upper Arm Bike)  60 RPM x8 mins    Wall Pushups  20 reps    Other ROM/Strengthening Exercises  R shoulder wall clocks x15 reps green theraband      Modalities   Modalities  Electrical Stimulation;Vasopneumatic      Electrical Stimulation   Electrical Stimulation Location  R shoulder    Electrical Stimulation Action  Pre-Mod    Electrical Stimulation Parameters  80-150 hz x15 min    Electrical Stimulation Goals  Pain      Vasopneumatic   Number Minutes Vasopneumatic   15 minutes    Vasopnuematic Location   Shoulder    Vasopneumatic Pressure  Low    Vasopneumatic Temperature   34               PT Short Term Goals - 02/02/18 1245      PT SHORT TERM GOAL #1   Title  STG=LTG        PT Long Term Goals - 04/11/18 1158      PT LONG TERM GOAL #1   Title  Patient will be independent with HEP and its progression    Time  4    Period  Weeks    Status  Achieved      PT LONG TERM GOAL #2   Title  Patient will demonstrate 150+ degrees of right shoulder flexion AROM to improve ability to perform functional tasks.    Time  4    Period  Weeks    Status  On-going      PT LONG TERM GOAL #3   Title  Patient will demonstrate 60+ degrees of right shoulder external rotation AROM to improve ability to don/doff apparel.    Time  4    Period  Weeks    Status  Achieved      PT LONG TERM GOAL #4   Title  Patient will demonstrate 4/5 or greater right shoulder MMT to improve stability during functional tasks.    Time  4    Period  Weeks    Status  On-going      PT LONG TERM GOAL #5   Title  Patient will demonstrate ability to perfrom all ADLs independently with pain less than 3/10 in right shoulder.    Time  4    Period  Weeks  Status  Achieved            Plan - 04/19/18 1319    Clinical Impression Statement  Patient presented in clinic with very  "minor" R shoulder discomfort today. Patient progressed to more functional and recreational strengthening as patient is very motivated to return to golf as soon as he is fully released to. Patient to begin chipping and putting next week per permission from Dr. Theda Sers. No complaints of any increased pain with any therex today, only complaints of fatigue. Good overall technique noted with all exercises and required only minimal VCs to improve technique. Normal modalities response noted following removal of the modalities. Patient provided green theraband to progress HEP.    Stability/Clinical Decision Making  Stable/Uncomplicated    Rehab Potential  Excellent    Clinical Impairments Affecting Rehab Potential  surgery 01/24/18 11 weeks 04/11/18    PT Frequency  3x / week    PT Duration  4 weeks    PT Treatment/Interventions  Cryotherapy;Ultrasound;Moist Heat;Electrical Stimulation;Iontophoresis 4mg /ml Dexamethasone;Functional mobility training;Neuromuscular re-education;Therapeutic exercise;Therapeutic activities;Patient/family education;Manual techniques;Passive range of motion;Scar mobilization;Vasopneumatic Device;ADLs/Self Care Home Management    PT Next Visit Plan  body blade, dynamic strengthening Progress with AROM and strengthening per POC. MD note sent for 04/12/2018 follow up.     PT Home Exercise Plan  see patient education section    Consulted and Agree with Plan of Care  Patient       Patient will benefit from skilled therapeutic intervention in order to improve the following deficits and impairments:  Pain, Decreased strength, Decreased range of motion, Impaired UE functional use  Visit Diagnosis: Stiffness of right shoulder, not elsewhere classified  Acute pain of right shoulder  Muscle weakness (generalized)     Problem List Patient Active Problem List   Diagnosis Date Noted  . BPH (benign prostatic hyperplasia) 11/25/2015  . Sprain and strain of metatarsophalangeal joint  04/02/2015  . Erectile dysfunction   . Diabetes mellitus ([place])   . Hyperlipidemia associated with type 2 diabetes mellitus ([place]) 05/25/2012  . HTN (hypertension) 05/25/2012    Standley Brooking, PTA 04/19/2018, 1:33 PM  [facility] [address] [place], [place], [address] Phone: [phone]   Fax:  [phone]  Name: Tony Hodges MRN: [id number] Date of Birth: 06-17-1950

## 2018-04-24 ENCOUNTER — Encounter: Payer: Self-pay | Admitting: Physical Therapy

## 2018-04-24 ENCOUNTER — Ambulatory Visit: Payer: Medicare Other | Admitting: Physical Therapy

## 2018-04-24 ENCOUNTER — Other Ambulatory Visit: Payer: Self-pay

## 2018-04-24 DIAGNOSIS — M6281 Muscle weakness (generalized): Secondary | ICD-10-CM | POA: Diagnosis not present

## 2018-04-24 DIAGNOSIS — M25611 Stiffness of right shoulder, not elsewhere classified: Secondary | ICD-10-CM | POA: Diagnosis not present

## 2018-04-24 DIAGNOSIS — M25511 Pain in right shoulder: Secondary | ICD-10-CM | POA: Diagnosis not present

## 2018-04-24 NOTE — Therapy (Signed)
[facility] [place], [place], [address] Phone: [phone]   Fax:  [phone]  Physical Therapy Treatment  Patient Details  Name: Tony Hodges MRN: [id number] Date of Birth: 1950-10-12 Referring Provider (PT): Sydnee Cabal, MD   Encounter Date: 04/24/2018  PT End of Session - 04/24/18 0947    Visit Number  28    Number of Visits  36    Date for PT Re-Evaluation  05/26/18    Authorization Type  FOTO; progress note every 10th visit     12th visit FOTO 55% limitation    PT Start Time  0946    PT Stop Time  1038    PT Time Calculation (min)  52 min    Activity Tolerance  Patient tolerated treatment well    Behavior During Therapy  [facility] for tasks assessed/performed       Past Medical History:  Diagnosis Date  . Diabetes mellitus   . Erectile dysfunction   . Hyperlipidemia   . Hypertension     Past Surgical History:  Procedure Laterality Date  . UMBILICAL HERNIA REPAIR  2010    There were no vitals filed for this visit.  Subjective Assessment - 04/24/18 0947    Subjective  Reports only minimal tenderness of R shoulder upon arrival. COVID 19 screening performed on patient prior to entering building.    Pertinent History  HTN, DM, R RTC 01/24/2018    Limitations  Lifting;House hold activities    Diagnostic tests  MRI, X-Ray    Patient Stated Goals  improve movement, play golf    Currently in Pain?  Yes    Pain Score  --   "low"   Pain Location  Shoulder    Pain Orientation  Right    Pain Descriptors / Indicators  Tender    Pain Type  Surgical pain    Pain Onset  More than a month ago    Pain Frequency  Intermittent         [facility] PT Assessment - 04/24/18 0001      Assessment   Medical Diagnosis  right shoulder scope SAD/DCR/biceps tenodesis RTC repair    Referring Provider (PT)  Sydnee Cabal, MD    Onset Date/Surgical Date  01/24/18    Hand Dominance  Left    Next MD Visit  05/24/2018    Prior  Therapy  no      Precautions   Precautions  Shoulder    Type of Shoulder Precautions  RTC repair; see media for RTC repair [place] orthopedics protocol                   [name] - 04/24/18 0001      Shoulder Exercises: Prone   Retraction  Strengthening;Right;Weights;Limitations    Retraction Weight (lbs)  3    Retraction Limitations  3x10 reps    Extension  Strengthening;Right;Weights;Limitations    Extension Weight (lbs)  3    Extension Limitations  3x10 reps    Horizontal ABduction 1  Strengthening;Right;Weights;Limitations    Horizontal ABduction 1 Weight (lbs)  3    Horizontal ABduction 1 Limitations  3x10 reps      Shoulder Exercises: Sidelying   External Rotation  Strengthening;Right;Weights;Limitations    External Rotation Weight (lbs)  3    External Rotation Limitations  3x10 reps      Shoulder Exercises: Standing   Protraction  Strengthening;Right;Theraband;Limitations    Theraband Level (Shoulder Protraction)  Level  3 (Green)    Protraction Limitations  3x10 reps    External Rotation  Strengthening;Right;Theraband;Limitations    Theraband Level (Shoulder External Rotation)  Level 3 (Green)    External Rotation Limitations  3x10 reps    Internal Rotation  Strengthening;Right;Theraband;Limitations    Theraband Level (Shoulder Internal Rotation)  Level 3 (Green)    Internal Rotation Limitations  3x10 reps    Flexion  Strengthening;Right;Weights;Limitations    Shoulder Flexion Weight (lbs)  3    Flexion Limitations  3x10 reps    Extension  Strengthening;Both;Limitations    Extension Limitations  3x10 reps pink XTS    Row  Strengthening;Right;Theraband;Limitations    Row Limitations  3x10 reps pink XTS    Diagonals  Strengthening;Right;Theraband;Limitations    Diagonals Limitations  3x10 reps each of RUE D1 and D2    Other Standing Exercises  B chop wood pink XTS x20 reps    Other Standing Exercises  R shoulder scaption 3# x30 reps       Shoulder Exercises: ROM/Strengthening   Nustep  30 RPMs x8 min    Wall Pushups  Other (comment)   3x10 reps   Other ROM/Strengthening Exercises  R shoulder wall clocks x5 reps green theraband    Other ROM/Strengthening Exercises  Wall walks green theraband x3 RT      Shoulder Exercises: Body Blade   Flexion  60 seconds;3 reps      Modalities   Modalities  Electrical Stimulation;Vasopneumatic      Acupuncturist Location  R shoulder    Electrical Stimulation Action  Pre-Mod    Electrical Stimulation Parameters  80-150 hz x15 min    Electrical Stimulation Goals  Pain      Vasopneumatic   Number Minutes Vasopneumatic   15 minutes    Vasopnuematic Location   Shoulder    Vasopneumatic Pressure  Low    Vasopneumatic Temperature   34               PT Short Term Goals - 02/02/18 1245      PT SHORT TERM GOAL #1   Title  STG=LTG        PT Long Term Goals - 04/11/18 1158      PT LONG TERM GOAL #1   Title  Patient will be independent with HEP and its progression    Time  4    Period  Weeks    Status  Achieved      PT LONG TERM GOAL #2   Title  Patient will demonstrate 150+ degrees of right shoulder flexion AROM to improve ability to perform functional tasks.    Time  4    Period  Weeks    Status  On-going      PT LONG TERM GOAL #3   Title  Patient will demonstrate 60+ degrees of right shoulder external rotation AROM to improve ability to don/doff apparel.    Time  4    Period  Weeks    Status  Achieved      PT LONG TERM GOAL #4   Title  Patient will demonstrate 4/5 or greater right shoulder MMT to improve stability during functional tasks.    Time  4    Period  Weeks    Status  On-going      PT LONG TERM GOAL #5   Title  Patient will demonstrate ability to perfrom all ADLs independently with pain less than 3/10 in right shoulder.  Time  4    Period  Weeks    Status  Achieved            Plan - 04/24/18 1033     Clinical Impression Statement  Patient presented in clinic with "low" rating of R shoulder tenderness upon start of PT treatment. Patient progressed with resistance exercises but no complaints of any discomfort during the treatment. Muscle fatigue noted in RUE during today's session. Patient especially weak still in resisted flexion and SL ER. Normal modalities response noted following removal of the modalities.    Stability/Clinical Decision Making  Stable/Uncomplicated    Rehab Potential  Excellent    Clinical Impairments Affecting Rehab Potential  surgery 01/24/18 11 weeks 04/11/18    PT Frequency  3x / week    PT Duration  4 weeks    PT Treatment/Interventions  Cryotherapy;Ultrasound;Moist Heat;Electrical Stimulation;Iontophoresis 4mg /ml Dexamethasone;Functional mobility training;Neuromuscular re-education;Therapeutic exercise;Therapeutic activities;Patient/family education;Manual techniques;Passive range of motion;Scar mobilization;Vasopneumatic Device;ADLs/Self Care Home Management    PT Next Visit Plan  Continue with progressive and dynamic strengthening per POC.    PT Home Exercise Plan  see patient education section    Consulted and Agree with Plan of Care  Patient       Patient will benefit from skilled therapeutic intervention in order to improve the following deficits and impairments:  Pain, Decreased strength, Decreased range of motion, Impaired UE functional use  Visit Diagnosis: Stiffness of right shoulder, not elsewhere classified  Acute pain of right shoulder  Muscle weakness (generalized)     Problem List Patient Active Problem List   Diagnosis Date Noted  . BPH (benign prostatic hyperplasia) 11/25/2015  . Sprain and strain of metatarsophalangeal joint 04/02/2015  . Erectile dysfunction   . Diabetes mellitus ([place])   . Hyperlipidemia associated with type 2 diabetes mellitus ([place]) 05/25/2012  . HTN (hypertension) 05/25/2012    Standley Brooking, PTA 04/24/18 11:14  AM   [facility] [place], [place], [address] Phone: [phone]   Fax:  [phone]  Name: AMADU SCHLAGETER MRN: [id number] Date of Birth: 06/27/1950

## 2018-04-26 ENCOUNTER — Ambulatory Visit: Payer: Medicare Other | Admitting: Physical Therapy

## 2018-04-26 ENCOUNTER — Other Ambulatory Visit: Payer: Self-pay

## 2018-04-26 ENCOUNTER — Encounter: Payer: Self-pay | Admitting: Physical Therapy

## 2018-04-26 DIAGNOSIS — M25611 Stiffness of right shoulder, not elsewhere classified: Secondary | ICD-10-CM | POA: Diagnosis not present

## 2018-04-26 DIAGNOSIS — M6281 Muscle weakness (generalized): Secondary | ICD-10-CM | POA: Diagnosis not present

## 2018-04-26 DIAGNOSIS — M25511 Pain in right shoulder: Secondary | ICD-10-CM | POA: Diagnosis not present

## 2018-04-26 NOTE — Therapy (Signed)
[facility] [place], [place], [address] Phone: [phone]   Fax:  [phone]  Physical Therapy Treatment  Patient Details  Name: Tony Hodges MRN: [id number] Date of Birth: January 07, 1951 Referring Provider (PT): Sydnee Cabal, MD   Encounter Date: 04/26/2018  PT End of Session - 04/26/18 1254    Visit Number  29    Number of Visits  36    Date for PT Re-Evaluation  05/26/18    Authorization Type  FOTO; progress note every 10th visit     12th visit FOTO 55% limitation    PT Start Time  1247    PT Stop Time  1341    PT Time Calculation (min)  54 min    Activity Tolerance  Patient tolerated treatment well    Behavior During Therapy  [facility] for tasks assessed/performed       Past Medical History:  Diagnosis Date  . Diabetes mellitus   . Erectile dysfunction   . Hyperlipidemia   . Hypertension     Past Surgical History:  Procedure Laterality Date  . UMBILICAL HERNIA REPAIR  2010    There were no vitals filed for this visit.  Subjective Assessment - 04/26/18 1253    Subjective  Reports only minimal tenderness along bicep. Reports using 5# handweights at home and using a purple theraband from previous PT occurance from years ago at home. COVID 19 screening performed on patient prior to entering building.    Pertinent History  HTN, DM, R RTC 01/24/2018    Limitations  Lifting;House hold activities    Diagnostic tests  MRI, X-Ray    Patient Stated Goals  improve movement, play golf    Currently in Pain?  Yes    Pain Score  1     Pain Location  Shoulder    Pain Orientation  Right;Proximal    Pain Descriptors / Indicators  Tender    Pain Type  Surgical pain    Pain Onset  More than a month ago    Pain Frequency  Intermittent         [facility] PT Assessment - 04/26/18 0001      Assessment   Medical Diagnosis  right shoulder scope SAD/DCR/biceps tenodesis RTC repair    Referring Provider (PT)  Sydnee Cabal, MD     Onset Date/Surgical Date  01/24/18    Hand Dominance  Left    Next MD Visit  05/24/2018    Prior Therapy  no      Precautions   Precautions  Shoulder    Type of Shoulder Precautions  RTC repair; see media for RTC repair [place] orthopedics protocol                   [name] - 04/26/18 0001      Shoulder Exercises: Prone   Retraction  Strengthening;Right;Weights;Limitations    Retraction Weight (lbs)  4    Retraction Limitations  3x10 reps    Extension  Strengthening;Right;Weights;Limitations    Extension Weight (lbs)  4    Extension Limitations  3x10 reps      Shoulder Exercises: Sidelying   External Rotation  Strengthening;Right;Weights;Limitations    External Rotation Weight (lbs)  3    External Rotation Limitations  3x10 reps      Shoulder Exercises: Standing   Protraction  Strengthening;Right;Theraband;Limitations    Theraband Level (Shoulder Protraction)  Level 3 (Green)    Protraction Limitations  3x10 reps  External Rotation  Strengthening;Right;Theraband;Limitations    Theraband Level (Shoulder External Rotation)  Level 3 (Green)    External Rotation Limitations  3x10 reps    Internal Rotation  Strengthening;Right;Theraband;Limitations    Theraband Level (Shoulder Internal Rotation)  Level 3 (Green)    Internal Rotation Limitations  3x10 reps    Flexion  Strengthening;Right;Weights;Limitations    Shoulder Flexion Weight (lbs)  3    Flexion Limitations  3x10 reps    ABduction  Strengthening;Right;Weights;Limitations    Shoulder ABduction Weight (lbs)  3    ABduction Limitations  3x10 reps    Extension  Strengthening;Both;Limitations;20 reps    Extension Limitations  Orange XTS    Diagonals  Strengthening;Right;Theraband;Limitations    Theraband Level (Shoulder Diagonals)  Level 3 (Green)    Diagonals Limitations  3x10 reps each of RUE D1 and D2    Other Standing Exercises  B chop wood orange XTS x20 reps    Other Standing  Exercises  R shoulder scaption 3# x30 reps      Shoulder Exercises: ROM/Strengthening   Nustep  30 RPMs x8 min    Wall Pushups  Other (comment)   x30 reps   Other ROM/Strengthening Exercises  R shoulder wall clocks x10 reps green theraband    Other ROM/Strengthening Exercises  Scapular wall slides x20 reps      Shoulder Exercises: Body Blade   Flexion  60 seconds;3 reps    Flexion Limitations  large bodyblade      Modalities   Modalities  Electrical Stimulation;Vasopneumatic      Electrical Stimulation   Electrical Stimulation Location  R shoulder    Electrical Stimulation Action  Pre-Mod    Electrical Stimulation Parameters  80-150 hz x15 min    Electrical Stimulation Goals  Pain      Vasopneumatic   Number Minutes Vasopneumatic   15 minutes    Vasopnuematic Location   Shoulder    Vasopneumatic Pressure  Low    Vasopneumatic Temperature   34               PT Short Term Goals - 02/02/18 1245      PT SHORT TERM GOAL #1   Title  STG=LTG        PT Long Term Goals - 04/11/18 1158      PT LONG TERM GOAL #1   Title  Patient will be independent with HEP and its progression    Time  4    Period  Weeks    Status  Achieved      PT LONG TERM GOAL #2   Title  Patient will demonstrate 150+ degrees of right shoulder flexion AROM to improve ability to perform functional tasks.    Time  4    Period  Weeks    Status  On-going      PT LONG TERM GOAL #3   Title  Patient will demonstrate 60+ degrees of right shoulder external rotation AROM to improve ability to don/doff apparel.    Time  4    Period  Weeks    Status  Achieved      PT LONG TERM GOAL #4   Title  Patient will demonstrate 4/5 or greater right shoulder MMT to improve stability during functional tasks.    Time  4    Period  Weeks    Status  On-going      PT LONG TERM GOAL #5   Title  Patient will demonstrate ability to perfrom all  ADLs independently with pain less than 3/10 in right shoulder.    Time   4    Period  Weeks    Status  Achieved            Plan - 04/26/18 1335    Clinical Impression Statement  Patient presented in clinic with low level R bicep tenderness. No complaints during treatment today of any discomfort. No functional limitations reported by patient in his home enviroment. Patient has not attempted any light golf activities that he was permitted to by MD at last night. Patient progressed with resistance exercises today with no complaints. Normal modalities response noted following removal of the modalities.    Stability/Clinical Decision Making  Stable/Uncomplicated    Rehab Potential  Excellent    Clinical Impairments Affecting Rehab Potential  surgery 01/24/18 11 weeks 04/11/18    PT Frequency  3x / week    PT Duration  4 weeks    PT Treatment/Interventions  Cryotherapy;Ultrasound;Moist Heat;Electrical Stimulation;Iontophoresis 4mg /ml Dexamethasone;Functional mobility training;Neuromuscular re-education;Therapeutic exercise;Therapeutic activities;Patient/family education;Manual techniques;Passive range of motion;Scar mobilization;Vasopneumatic Device;ADLs/Self Care Home Management    PT Next Visit Plan  Continue with progressive and dynamic strengthening per POC.    PT Home Exercise Plan  see patient education section    Consulted and Agree with Plan of Care  Patient       Patient will benefit from skilled therapeutic intervention in order to improve the following deficits and impairments:  Pain, Decreased strength, Decreased range of motion, Impaired UE functional use  Visit Diagnosis: Stiffness of right shoulder, not elsewhere classified  Acute pain of right shoulder  Muscle weakness (generalized)     Problem List Patient Active Problem List   Diagnosis Date Noted  . BPH (benign prostatic hyperplasia) 11/25/2015  . Sprain and strain of metatarsophalangeal joint 04/02/2015  . Erectile dysfunction   . Diabetes mellitus ([place])   . Hyperlipidemia  associated with type 2 diabetes mellitus ([place]) 05/25/2012  . HTN (hypertension) 05/25/2012    Standley Brooking, PTA 04/26/2018, 1:54 PM  [facility] [address] [place], [place], [address] Phone: [phone]   Fax:  [phone]  Name: Tony Hodges MRN: [id number] Date of Birth: 14-Aug-1950

## 2018-04-28 ENCOUNTER — Encounter: Payer: Medicare Other | Admitting: Physical Therapy

## 2018-05-01 ENCOUNTER — Ambulatory Visit: Payer: Medicare Other | Admitting: Physical Therapy

## 2018-05-01 ENCOUNTER — Encounter: Payer: Self-pay | Admitting: Physical Therapy

## 2018-05-01 ENCOUNTER — Other Ambulatory Visit: Payer: Self-pay

## 2018-05-01 DIAGNOSIS — M25611 Stiffness of right shoulder, not elsewhere classified: Secondary | ICD-10-CM | POA: Diagnosis not present

## 2018-05-01 DIAGNOSIS — M25511 Pain in right shoulder: Secondary | ICD-10-CM | POA: Diagnosis not present

## 2018-05-01 DIAGNOSIS — M6281 Muscle weakness (generalized): Secondary | ICD-10-CM | POA: Diagnosis not present

## 2018-05-01 NOTE — Therapy (Signed)
[facility] [place], [place], [address] Phone: [phone]   Fax:  [phone]  Physical Therapy Treatment  Patient Details  Name: Tony Hodges MRN: [id number] Date of Birth: 1950/12/29 Referring Provider (PT): Sydnee Cabal, MD   Encounter Date: 05/01/2018  PT End of Session - 05/01/18 0947    Visit Number  30    Number of Visits  36    Date for PT Re-Evaluation  05/26/18    Authorization Type  FOTO; progress note every 10th visit     12th visit FOTO 55% limitation    PT Start Time  0941    PT Stop Time  1030    PT Time Calculation (min)  49 min    Activity Tolerance  Patient tolerated treatment well    Behavior During Therapy  [facility] for tasks assessed/performed       Past Medical History:  Diagnosis Date  . Diabetes mellitus   . Erectile dysfunction   . Hyperlipidemia   . Hypertension     Past Surgical History:  Procedure Laterality Date  . UMBILICAL HERNIA REPAIR  2010    There were no vitals filed for this visit.  Subjective Assessment - 05/01/18 0943    Subjective  Reports his normal stiffness this morning but no pain. Reports lifting a 5# weight overhead this week. COVID 19 screening performed on patient prior to entering clinic.    Pertinent History  HTN, DM, R RTC 01/24/2018    Limitations  Lifting;House hold activities    Diagnostic tests  MRI, X-Ray    Patient Stated Goals  improve movement, play golf    Currently in Pain?  No/denies         [facility] PT Assessment - 05/01/18 0001      Assessment   Medical Diagnosis  right shoulder scope SAD/DCR/biceps tenodesis RTC repair    Referring Provider (PT)  Sydnee Cabal, MD    Onset Date/Surgical Date  01/24/18    Hand Dominance  Left    Next MD Visit  05/24/2018    Prior Therapy  no      Precautions   Precautions  Shoulder    Type of Shoulder Precautions  RTC repair; see media for RTC repair [place] orth[facility]ics protocol                    [name] - 05/01/18 0001      Shoulder Exercises: Prone   Retraction  Strengthening;Right;Weights;Limitations    Retraction Weight (lbs)  5    Retraction Limitations  3x10 reps    Extension  Strengthening;Right;Weights;Limitations    Extension Weight (lbs)  5    Extension Limitations  3x10 reps      Shoulder Exercises: Standing   Protraction  Strengthening;Both;20 reps;Limitations    Protraction Limitations  Orange Advice worker;Theraband;Limitations    Theraband Level (Shoulder External Rotation)  Level 3 (Green)    External Rotation Limitations  3x10 reps    Internal Rotation  Strengthening;Right;Theraband;Limitations    Theraband Level (Shoulder Internal Rotation)  Level 3 (Green)    Internal Rotation Limitations  3x10 reps    Flexion  Strengthening;Both;20 reps;Theraband;Weights    Theraband Level (Shoulder Flexion)  Level 2 (Red)    Shoulder Flexion Weight (lbs)  3    Flexion Limitations  flexion and horizontal abduction    ABduction  Strengthening;Right;Weights;Limitations    Shoulder ABduction Weight (lbs)  3  ABduction Limitations  3x10 reps    Extension  Strengthening;Both;Limitations;20 reps    Extension Limitations  Orange XTS    Diagonals  Strengthening;Right;Theraband;Limitations    Theraband Level (Shoulder Diagonals)  Level 3 (Green)    Diagonals Limitations  3x10 reps each of RUE D1 and D2    Other Standing Exercises  B chop wood orange XTS x20 reps    Other Standing Exercises  R shoulder scaption 3# x30 reps; R shoulder 90/90 ER 3# x30 reps      Shoulder Exercises: ROM/Strengthening   Nustep  30 RPMs x8 min    Wall Pushups  Other (comment)   3x10 reps   Other ROM/Strengthening Exercises  snow angels x30 reps    Other ROM/Strengthening Exercises  Scapular wall slides x20 reps      Modalities   Modalities  Electrical Stimulation;Vasopneumatic      Electrical Stimulation    Electrical Stimulation Location  R shoulder    Electrical Stimulation Action  Pre-Mod    Electrical Stimulation Parameters  80-150 hz x15 min    Electrical Stimulation Goals  Other (comment)   post- treatment cool down from muscle fatigue     Vasopneumatic   Number Minutes Vasopneumatic   15 minutes    Vasopnuematic Location   Shoulder    Vasopneumatic Pressure  Low    Vasopneumatic Temperature   34               PT Short Term Goals - 02/02/18 1245      PT SHORT TERM GOAL #1   Title  STG=LTG        PT Long Term Goals - 04/11/18 1158      PT LONG TERM GOAL #1   Title  Patient will be independent with HEP and its progression    Time  4    Period  Weeks    Status  Achieved      PT LONG TERM GOAL #2   Title  Patient will demonstrate 150+ degrees of right shoulder flexion AROM to improve ability to perform functional tasks.    Time  4    Period  Weeks    Status  On-going      PT LONG TERM GOAL #3   Title  Patient will demonstrate 60+ degrees of right shoulder external rotation AROM to improve ability to don/doff apparel.    Time  4    Period  Weeks    Status  Achieved      PT LONG TERM GOAL #4   Title  Patient will demonstrate 4/5 or greater right shoulder MMT to improve stability during functional tasks.    Time  4    Period  Weeks    Status  On-going      PT LONG TERM GOAL #5   Title  Patient will demonstrate ability to perfrom all ADLs independently with pain less than 3/10 in right shoulder.    Time  4    Period  Weeks    Status  Achieved            Plan - 05/01/18 1028    Clinical Impression Statement  Patient presented in clinic with reports of "normal" stiffness but no complaints of pain. Patient's greatest complaint with today's progressed therex session was of muscle fatigue. Good overall form of exercises after initial VCs and demo for technique. Good R scapular mobility noted today as well. No deficits or complaints with attemps at  chipping and  putting over the weekend per patient report. Normal modalities response noted following removal of the modalities.    Stability/Clinical Decision Making  Stable/Uncomplicated    Rehab Potential  Excellent    Clinical Impairments Affecting Rehab Potential  surgery 01/24/18 11 weeks 04/11/18    PT Frequency  3x / week    PT Duration  4 weeks    PT Treatment/Interventions  Cryotherapy;Ultrasound;Moist Heat;Electrical Stimulation;Iontophoresis 4mg /ml Dexamethasone;Functional mobility training;Neuromuscular re-education;Therapeutic exercise;Therapeutic activities;Patient/family education;Manual techniques;Passive range of motion;Scar mobilization;Vasopneumatic Device;ADLs/Self Care Home Management    PT Next Visit Plan  Continue with progressive and dynamic strengthening per POC. Complete FOTO on 31st visit.    PT Home Exercise Plan  see patient education section    Consulted and Agree with Plan of Care  Patient       Patient will benefit from skilled therapeutic intervention in order to improve the following deficits and impairments:  Pain, Decreased strength, Decreased range of motion, Impaired UE functional use  Visit Diagnosis: Stiffness of right shoulder, not elsewhere classified  Acute pain of right shoulder  Muscle weakness (generalized)     Problem List Patient Active Problem List   Diagnosis Date Noted  . BPH (benign prostatic hyperplasia) 11/25/2015  . Sprain and strain of metatarsophalangeal joint 04/02/2015  . Erectile dysfunction   . Diabetes mellitus ([place])   . Hyperlipidemia associated with type 2 diabetes mellitus ([place]) 05/25/2012  . HTN (hypertension) 05/25/2012    Standley Brooking, PTA 05/01/2018, 10:41 AM  [facility] [address] [place], [place], [address] Phone: [phone]   Fax:  [phone]  Name: ALEXY HELDT MRN: [id number] Date of Birth: [birth year]

## 2018-05-05 ENCOUNTER — Encounter: Payer: Self-pay | Admitting: Physical Therapy

## 2018-05-05 ENCOUNTER — Other Ambulatory Visit: Payer: Self-pay

## 2018-05-05 ENCOUNTER — Ambulatory Visit: Payer: Medicare Other | Admitting: Physical Therapy

## 2018-05-05 DIAGNOSIS — M25511 Pain in right shoulder: Secondary | ICD-10-CM | POA: Diagnosis not present

## 2018-05-05 DIAGNOSIS — M25611 Stiffness of right shoulder, not elsewhere classified: Secondary | ICD-10-CM | POA: Diagnosis not present

## 2018-05-05 DIAGNOSIS — M6281 Muscle weakness (generalized): Secondary | ICD-10-CM

## 2018-05-05 NOTE — Therapy (Signed)
[facility] [place], [place], [address] Phone: [phone]   Fax:  [phone]  Physical Therapy Treatment  Patient Details  Name: Tony Hodges MRN: [id number] Date of Birth: Aug 26, 1950 Referring Provider (PT): Sydnee Cabal, MD   Encounter Date: 05/05/2018  PT End of Session - 05/05/18 0952    Visit Number  31    Number of Visits  36    Date for PT Re-Evaluation  05/26/18    Authorization Type  FOTO; progress note every 10th visit     12th visit FOTO 55% limitation    PT Start Time  0946    PT Stop Time  1038  (Pended)     PT Time Calculation (min)  52 min  (Pended)     Activity Tolerance  Patient tolerated treatment well    Behavior During Therapy  [facility] for tasks assessed/performed       Past Medical History:  Diagnosis Date  . Diabetes mellitus   . Erectile dysfunction   . Hyperlipidemia   . Hypertension     Past Surgical History:  Procedure Laterality Date  . UMBILICAL HERNIA REPAIR  2010    There were no vitals filed for this visit.  Subjective Assessment - 05/05/18 0951    Subjective  COVID 19 screening performed on patient prior to entering building. No new complaints.    Pertinent History  HTN, DM, R RTC 01/24/2018    Limitations  Lifting;House hold activities    Diagnostic tests  MRI, X-Ray    Patient Stated Goals  improve movement, play golf    Currently in Pain?  No/denies         [facility] PT Assessment - 05/05/18 0001      Assessment   Medical Diagnosis  right shoulder scope SAD/DCR/biceps tenodesis RTC repair    Referring Provider (PT)  Sydnee Cabal, MD    Onset Date/Surgical Date  01/24/18    Hand Dominance  Left    Next MD Visit  05/24/2018    Prior Therapy  no      Precautions   Precautions  Shoulder    Type of Shoulder Precautions  RTC repair; see media for RTC repair [place] orth[facility]ics protocol                   [name] - 05/05/18 0001       Shoulder Exercises: Standing   Protraction  Strengthening;Both;Limitations    Protraction Limitations  Pink XTS 3x10 reps    External Rotation  Strengthening;Right;Theraband;Limitations    Theraband Level (Shoulder External Rotation)  Level 3 (Green)    External Rotation Limitations  3x10 reps    Internal Rotation  Strengthening;Right;Theraband;Limitations    Theraband Level (Shoulder Internal Rotation)  Level 3 (Green)    Internal Rotation Limitations  3x10 reps    Flexion  Strengthening;Both;Theraband;Weights;Limitations    Theraband Level (Shoulder Flexion)  Level 2 (Red)    Shoulder Flexion Weight (lbs)  3    Flexion Limitations  flexion and horizontal abduction 3x10 reps    ABduction  Strengthening;Right;Weights;Limitations    Shoulder ABduction Weight (lbs)  3    ABduction Limitations  3x10 reps    Extension  Strengthening;Both;Limitations;20 reps    Extension Limitations  Pink XTS 3x10 reps    Diagonals  Strengthening;Right;Theraband;Limitations    Theraband Level (Shoulder Diagonals)  Level 3 (Green)    Diagonals Limitations  3x10 reps each of RUE D1 and D2  Other Standing Exercises  B chop wood pink XTS x30 reps    Other Standing Exercises  R shoulder scaption 3# x30 reps; R shoulder 90/90 ER 3# x30 reps      Shoulder Exercises: ROM/Strengthening   Nustep  30 RPMs x8 min    Wall Pushups  Other (comment)   x30 reps   Other ROM/Strengthening Exercises  snow angels x30 reps    Other ROM/Strengthening Exercises  Scapular wall slides x20 reps      Modalities   Modalities  Electrical Stimulation;Vasopneumatic      Electrical Stimulation   Electrical Stimulation Location  R shoulder    Electrical Stimulation Action  Pre-Mod    Electrical Stimulation Parameters  80-150 hz x15 min    Electrical Stimulation Goals  Other (comment)   post treatment cooldown     Vasopneumatic   Number Minutes Vasopneumatic   15 minutes    Vasopnuematic Location   Shoulder    Vasopneumatic  Pressure  Low    Vasopneumatic Temperature   34               PT Short Term Goals - 02/02/18 1245      PT SHORT TERM GOAL #1   Title  STG=LTG        PT Long Term Goals - 04/11/18 1158      PT LONG TERM GOAL #1   Title  Patient will be independent with HEP and its progression    Time  4    Period  Weeks    Status  Achieved      PT LONG TERM GOAL #2   Title  Patient will demonstrate 150+ degrees of right shoulder flexion AROM to improve ability to perform functional tasks.    Time  4    Period  Weeks    Status  On-going      PT LONG TERM GOAL #3   Title  Patient will demonstrate 60+ degrees of right shoulder external rotation AROM to improve ability to don/doff apparel.    Time  4    Period  Weeks    Status  Achieved      PT LONG TERM GOAL #4   Title  Patient will demonstrate 4/5 or greater right shoulder MMT to improve stability during functional tasks.    Time  4    Period  Weeks    Status  On-going      PT LONG TERM GOAL #5   Title  Patient will demonstrate ability to perfrom all ADLs independently with pain less than 3/10 in right shoulder.    Time  4    Period  Weeks    Status  Achieved            Plan - 05/05/18 1103    Clinical Impression Statement  Patient presented in clinic with no complaints. Patient able to progress with resistance and more advanced strengthenig with no complaints other than muscle fatigue. Normal modalities response noted following removal of the modalities. 19% FOTO score with current score of CI.    Stability/Clinical Decision Making  Stable/Uncomplicated    Rehab Potential  Excellent    Clinical Impairments Affecting Rehab Potential  surgery 01/24/18 11 weeks 04/11/18    PT Frequency  3x / week    PT Duration  4 weeks    PT Treatment/Interventions  Cryotherapy;Ultrasound;Moist Heat;Electrical Stimulation;Iontophoresis 4mg /ml Dexamethasone;Functional mobility training;Neuromuscular re-education;Therapeutic  exercise;Therapeutic activities;Patient/family education;Manual techniques;Passive range of motion;Scar mobilization;Vasopneumatic Device;ADLs/Self Care Home Management  PT Next Visit Plan  Continue with progressive and dynamic strengthening per POC.     PT Home Exercise Plan  see patient education section    Consulted and Agree with Plan of Care  Patient       Patient will benefit from skilled therapeutic intervention in order to improve the following deficits and impairments:  Pain, Decreased strength, Decreased range of motion, Impaired UE functional use  Visit Diagnosis: Stiffness of right shoulder, not elsewhere classified  Acute pain of right shoulder  Muscle weakness (generalized)     Problem List Patient Active Problem List   Diagnosis Date Noted  . BPH (benign prostatic hyperplasia) 11/25/2015  . Sprain and strain of metatarsophalangeal joint 04/02/2015  . Erectile dysfunction   . Diabetes mellitus ([place])   . Hyperlipidemia associated with type 2 diabetes mellitus ([place]) 05/25/2012  . HTN (hypertension) 05/25/2012    Standley Brooking, PTA 05/05/2018, 11:29 AM  [facility] [address] [place], [place], [address] Phone: [phone]   Fax:  [phone]  Name: Tony Hodges MRN: [id number] Date of Birth: 11-18-1950

## 2018-05-08 ENCOUNTER — Ambulatory Visit: Payer: Medicare Other | Admitting: Physical Therapy

## 2018-05-08 ENCOUNTER — Encounter: Payer: Self-pay | Admitting: Physical Therapy

## 2018-05-08 ENCOUNTER — Other Ambulatory Visit: Payer: Self-pay

## 2018-05-08 DIAGNOSIS — M25611 Stiffness of right shoulder, not elsewhere classified: Secondary | ICD-10-CM

## 2018-05-08 DIAGNOSIS — M25511 Pain in right shoulder: Secondary | ICD-10-CM | POA: Diagnosis not present

## 2018-05-08 DIAGNOSIS — M6281 Muscle weakness (generalized): Secondary | ICD-10-CM

## 2018-05-08 NOTE — Therapy (Signed)
[facility] [place], [place], [address] Phone: [phone]   Fax:  [phone]  Physical Therapy Treatment  Patient Details  Name: Tony Hodges MRN: [id number] Date of Birth: Oct 26, 1950 Referring Provider (PT): Sydnee Cabal, MD   Encounter Date: 05/08/2018  PT End of Session - 05/08/18 0946    Visit Number  32    Number of Visits  36    Date for PT Re-Evaluation  05/26/18    Authorization Type  FOTO; progress note every 10th visit     12th visit FOTO 55% limitation    PT Start Time  0943    PT Stop Time  1038    PT Time Calculation (min)  55 min    Activity Tolerance  Patient tolerated treatment well    Behavior During Therapy  [facility] for tasks assessed/performed       Past Medical History:  Diagnosis Date  . Diabetes mellitus   . Erectile dysfunction   . Hyperlipidemia   . Hypertension     Past Surgical History:  Procedure Laterality Date  . UMBILICAL HERNIA REPAIR  2010    There were no vitals filed for this visit.  Subjective Assessment - 05/08/18 0945    Subjective  COVID 19 screening performed on patient prior to entering building. Reporting only "a tad bit" of soreness.    Pertinent History  HTN, DM, R RTC 01/24/2018    Limitations  Lifting;House hold activities    Diagnostic tests  MRI, X-Ray    Patient Stated Goals  improve movement, play golf    Currently in Pain?  Yes    Pain Score  --   "a tad bit"   Pain Location  Shoulder    Pain Orientation  Right;Proximal    Pain Descriptors / Indicators  Sore    Pain Type  Surgical pain    Pain Onset  More than a month ago    Pain Frequency  Intermittent         [facility] PT Assessment - 05/08/18 0001      Assessment   Medical Diagnosis  right shoulder scope SAD/DCR/biceps tenodesis RTC repair    Referring Provider (PT)  Sydnee Cabal, MD    Onset Date/Surgical Date  01/24/18    Hand Dominance  Left    Next MD Visit  05/24/2018    Prior Therapy   no      Precautions   Precautions  Shoulder    Type of Shoulder Precautions  RTC repair; see media for RTC repair [place] [facility]ics protocol                   [name] - 05/08/18 0001      Shoulder Exercises: Prone   Retraction  Strengthening;Right;Weights;Limitations    Retraction Weight (lbs)  5    Retraction Limitations  3x10 reps    Extension  Strengthening;Right;Weights;Limitations    Extension Weight (lbs)  5    Extension Limitations  3x10 reps    Horizontal ABduction 1  Strengthening;Right;Weights;Limitations    Horizontal ABduction 1 Weight (lbs)  3    Horizontal ABduction 1 Limitations  3x15 reps with trunk rotation      Shoulder Exercises: Standing   Protraction  Strengthening;Both;Limitations    Theraband Level (Shoulder Protraction)  Level 3 (Green)    Protraction Limitations  3x15 reps    External Rotation  Strengthening;Right;Theraband;Limitations    Theraband Level (Shoulder External Rotation)  Level 3 (Green)  External Rotation Limitations  3x15 reps    Internal Rotation  Strengthening;Right;Theraband;Limitations    Theraband Level (Shoulder Internal Rotation)  Level 3 (Green)    Internal Rotation Limitations  3x15 reps    Flexion  Strengthening;Weights;Limitations;Right    Shoulder Flexion Weight (lbs)  3    Flexion Limitations  3x10 reps    Extension  Strengthening;Both;Limitations;20 reps    Extension Limitations  Orange XTS 3x15 reps    Diagonals  Strengthening;Right;Theraband;Limitations    Theraband Level (Shoulder Diagonals)  Level 3 (Green)    Diagonals Limitations  3x15 reps each of RUE D1 and D2    Other Standing Exercises  B chop wood orange XTS 3x15 reps    Other Standing Exercises  R shoulder scaption 3# x30 reps; R shoulder 90/90 ER 3# x30 reps      Shoulder Exercises: ROM/Strengthening   Nustep  30 RPMs x8 min    Wall Pushups  Other (comment)   x30 reps   Other ROM/Strengthening Exercises  Protract with  horizontal abduction red theraband 3# 3x15 reps    Other ROM/Strengthening Exercises  Scapular Y slides x20 reps with hold at end range      Shoulder Exercises: Stretch   Internal Rotation Stretch  1 rep    Internal Rotation Stretch Limitations  x30 reps with R UT stretch      Modalities   Modalities  Cryotherapy;Electrical Stimulation      Cryotherapy   Number Minutes Cryotherapy  15 Minutes    Cryotherapy Location  Shoulder    Type of Cryotherapy  Ice pack      Electrical Stimulation   Electrical Stimulation Location  R shoulder    Electrical Stimulation Action  Pre-Mod    Electrical Stimulation Parameters  80-150 hz x15 min    Electrical Stimulation Goals  Other (comment)   post-treatment cool down              PT Short Term Goals - 02/02/18 1245      PT SHORT TERM GOAL #1   Title  STG=LTG        PT Long Term Goals - 04/11/18 1158      PT LONG TERM GOAL #1   Title  Patient will be independent with HEP and its progression    Time  4    Period  Weeks    Status  Achieved      PT LONG TERM GOAL #2   Title  Patient will demonstrate 150+ degrees of right shoulder flexion AROM to improve ability to perform functional tasks.    Time  4    Period  Weeks    Status  On-going      PT LONG TERM GOAL #3   Title  Patient will demonstrate 60+ degrees of right shoulder external rotation AROM to improve ability to don/doff apparel.    Time  4    Period  Weeks    Status  Achieved      PT LONG TERM GOAL #4   Title  Patient will demonstrate 4/5 or greater right shoulder MMT to improve stability during functional tasks.    Time  4    Period  Weeks    Status  On-going      PT LONG TERM GOAL #5   Title  Patient will demonstrate ability to perfrom all ADLs independently with pain less than 3/10 in right shoulder.    Time  4    Period  Weeks  Status  Achieved            Plan - 05/08/18 1028    Clinical Impression Statement  Patient presented in clinic with  only minimal complaints of R shoulder fatigue. Patient progressed in regards to parameters to promote strengthening and functional endurance. Good technique even with muscle fatigue present during therex. Patient demonstrated pull of R superior UT with reaching of RUE across to L shoulder. Towel stretch instructed with UT stretch completed and instructed for patient to complete at home. Patient still very compliant with HEP at home. Normal modalities response noted following removal of the modalities.    Stability/Clinical Decision Making  Stable/Uncomplicated    Rehab Potential  Excellent    Clinical Impairments Affecting Rehab Potential  surgery 01/24/18 11 weeks 04/11/18    PT Frequency  3x / week    PT Duration  4 weeks    PT Treatment/Interventions  Cryotherapy;Ultrasound;Moist Heat;Electrical Stimulation;Iontophoresis 4mg /ml Dexamethasone;Functional mobility training;Neuromuscular re-education;Therapeutic exercise;Therapeutic activities;Patient/family education;Manual techniques;Passive range of motion;Scar mobilization;Vasopneumatic Device;ADLs/Self Care Home Management    PT Next Visit Plan  Continue with progressive and dynamic strengthening per POC.     PT Home Exercise Plan  see patient education section    Consulted and Agree with Plan of Care  Patient       Patient will benefit from skilled therapeutic intervention in order to improve the following deficits and impairments:  Pain, Decreased strength, Decreased range of motion, Impaired UE functional use  Visit Diagnosis: Stiffness of right shoulder, not elsewhere classified  Acute pain of right shoulder  Muscle weakness (generalized)     Problem List Patient Active Problem List   Diagnosis Date Noted  . BPH (benign prostatic hyperplasia) 11/25/2015  . Sprain and strain of metatarsophalangeal joint 04/02/2015  . Erectile dysfunction   . Diabetes mellitus ([place])   . Hyperlipidemia associated with type 2 diabetes mellitus ([place])  05/25/2012  . HTN (hypertension) 05/25/2012    Standley Brooking, PTA 05/08/2018, 10:48 AM  [facility] [place], [place], [address] Phone: [phone]   Fax:  [phone]  Name: Tony Hodges MRN: [id number] Date of Birth: 30-Dec-1950

## 2018-05-09 ENCOUNTER — Encounter: Payer: Self-pay | Admitting: Family Medicine

## 2018-05-09 ENCOUNTER — Other Ambulatory Visit: Payer: Self-pay

## 2018-05-09 ENCOUNTER — Ambulatory Visit (INDEPENDENT_AMBULATORY_CARE_PROVIDER_SITE_OTHER): Payer: Medicare Other | Admitting: Family Medicine

## 2018-05-09 DIAGNOSIS — I10 Essential (primary) hypertension: Secondary | ICD-10-CM

## 2018-05-09 DIAGNOSIS — E785 Hyperlipidemia, unspecified: Secondary | ICD-10-CM

## 2018-05-09 DIAGNOSIS — E1169 Type 2 diabetes mellitus with other specified complication: Secondary | ICD-10-CM | POA: Diagnosis not present

## 2018-05-09 DIAGNOSIS — E0869 Diabetes mellitus due to underlying condition with other specified complication: Secondary | ICD-10-CM | POA: Diagnosis not present

## 2018-05-09 MED ORDER — AMLODIPINE BESY-BENAZEPRIL HCL 5-10 MG PO CAPS: 1.0000 | ORAL_CAPSULE | Freq: Every day | ORAL | 1 refills | Status: DC

## 2018-05-09 NOTE — Progress Notes (Signed)
Virtual Visit via telephone Note  I connected with Anette Riedel on 05/09/18 at 0835 by telephone and verified that I am speaking with the correct person using two identifiers. BENNO BRENSINGER is currently located at home and no other people are currently with her during visit. The provider, Fransisca Kaufmann Ricci Paff, MD is located in their office at time of visit.  Call ended at [place]  I discussed the limitations, risks, security and privacy concerns of performing an evaluation and management service by telephone and the availability of in person appointments. I also discussed with the patient that there may be a patient responsible charge related to this service. The patient expressed understanding and agreed to proceed.   History and Present Illness: Type 2 diabetes mellitus Patient comes in today for recheck of his diabetes. Patient has been currently taking actos and janumet and blood sugar 30 day average 117. Patient is currently on an ACE inhibitor/ARB. Patient has not seen an ophthalmologist this year. Patient denies any issues with their feet.   Hypertension Patient is currently on amlodipine-benazepril, and their blood pressure today is 128/78, 138/73, 139/74. Patient denies any lightheadedness or dizziness. Patient denies headaches, blurred vision, chest pains, shortness of breath, or weakness. Denies any side effects from medication and is content with current medication.   Hyperlipidemia Patient is coming in for recheck of his hyperlipidemia. The patient is currently taking lipitor. They deny any issues with myalgias or history of liver damage from it. They deny any focal numbness or weakness or chest pain.   No diagnosis found.  Outpatient Encounter Medications as of 05/09/2018  Medication Sig  . amLODipine-benazepril (LOTREL) 5-10 MG capsule TAKE 1 CAPSULE BY MOUTH EVERY DAY  . aspirin (LONGS ADULT LOW STRENGTH ASA) 81 MG EC tablet Take 81 mg by mouth daily.    Marland Kitchen atorvastatin  (LIPITOR) 20 MG tablet Take 0.5 tablets (10 mg total) by mouth at bedtime.  . diclofenac sodium (VOLTAREN) 1 % GEL Apply 2 g topically 4 (four) times daily.  Marland Kitchen glucose blood (ONE TOUCH ULTRA TEST) test strip Use as instructed to check blood sugar  . Lancets (ONETOUCH ULTRASOFT) lancets Check blood sugar bid. Dx E11.9  . pioglitazone (ACTOS) 30 MG tablet Take 1 tablet (30 mg total) by mouth daily.  . sitaGLIPtin-metformin (JANUMET) 50-1000 MG tablet Take 1 tablet by mouth 2 (two) times daily with a meal.  . traZODone (DESYREL) 50 MG tablet TAKE 0.5-1 TABLETS (25-50 MG TOTAL) BY MOUTH AT BEDTIME AS NEEDED FOR SLEEP.   No facility-administered encounter medications on file as of 05/09/2018.     Review of Systems  Constitutional: Negative for chills and fever.  Eyes: Negative for visual disturbance.  Respiratory: Negative for shortness of breath and wheezing.   Cardiovascular: Negative for chest pain and leg swelling.  Musculoskeletal: Negative for back pain and gait problem.  Skin: Negative for rash.  Neurological: Negative for dizziness, weakness and light-headedness.  All other systems reviewed and are negative.   Observations/Objective: Patient sounds comfortable and in no acute distress  Assessment and Plan: Problem List Items Addressed This Visit      Cardiovascular and Mediastinum   HTN (hypertension)   Relevant Medications   amLODipine-benazepril (LOTREL) 5-10 MG capsule     Endocrine   Hyperlipidemia associated with type 2 diabetes mellitus ([place]) - Primary   Relevant Medications   amLODipine-benazepril (LOTREL) 5-10 MG capsule   Diabetes mellitus ([place])   Relevant Medications   amLODipine-benazepril (LOTREL) 5-10  MG capsule       Follow Up Instructions:  Follow up in 3 months   I discussed the assessment and treatment plan with the patient. The patient was provided an opportunity to ask questions and all were answered. The patient agreed with the plan and  demonstrated an understanding of the instructions.   The patient was advised to call back or seek an in-person evaluation if the symptoms worsen or if the condition fails to improve as anticipated.  The above assessment and management plan was discussed with the patient. The patient verbalized understanding of and has agreed to the management plan. Patient is aware to call the clinic if symptoms persist or worsen. Patient is aware when to return to the clinic for a follow-up visit. Patient educated on when it is appropriate to go to the emergency department.    I provided 10 minutes of non-face-to-face time during this encounter.    Worthy Rancher, MD

## 2018-05-11 ENCOUNTER — Other Ambulatory Visit: Payer: Self-pay

## 2018-05-11 ENCOUNTER — Ambulatory Visit: Payer: Medicare Other | Admitting: Physical Therapy

## 2018-05-11 DIAGNOSIS — M25511 Pain in right shoulder: Secondary | ICD-10-CM

## 2018-05-11 DIAGNOSIS — M6281 Muscle weakness (generalized): Secondary | ICD-10-CM | POA: Diagnosis not present

## 2018-05-11 DIAGNOSIS — M25611 Stiffness of right shoulder, not elsewhere classified: Secondary | ICD-10-CM

## 2018-05-11 NOTE — Therapy (Signed)
[facility] [place], [place], [address] Phone: [phone]   Fax:  [phone]  Physical Therapy Treatment  Patient Details  Name: Tony Hodges MRN: [id number] Date of Birth: 1950/12/12 Referring Provider (PT): Sydnee Cabal, MD   Encounter Date: 05/11/2018  PT End of Session - 05/11/18 1057    Visit Number  33    Number of Visits  36    Date for PT Re-Evaluation  05/26/18    Authorization Type  FOTO; progress note every 10th visit     12th visit FOTO 55% limitation    PT Start Time  0946    PT Stop Time  1047    PT Time Calculation (min)  61 min    Activity Tolerance  Patient tolerated treatment well    Behavior During Therapy  [facility] for tasks assessed/performed       Past Medical History:  Diagnosis Date  . Diabetes mellitus   . Erectile dysfunction   . Hyperlipidemia   . Hypertension     Past Surgical History:  Procedure Laterality Date  . UMBILICAL HERNIA REPAIR  2010    There were no vitals filed for this visit.  Subjective Assessment - 05/11/18 1031    Subjective  COVID-19 screen performed prior to patient entering clinic.  No new complaints.    Pertinent History  HTN, DM, R RTC 01/24/2018    Limitations  Lifting;House hold activities    Diagnostic tests  MRI, X-Ray    Patient Stated Goals  improve movement, play golf    Currently in Pain?  Yes    Pain Score  1     Pain Location  Shoulder    Pain Orientation  Right    Pain Descriptors / Indicators  Sore    Pain Type  Surgical pain    Pain Onset  More than a month ago                       [facility] Adult PT Treatment/Exercise - 05/11/18 0001      Shoulder Exercises: ROM/Strengthening   Nustep  30 RPM's x 10 minutes.      Modalities   Modalities  Cryotherapy;Electrical Stimulation;Ultrasound      Cryotherapy   Number Minutes Cryotherapy  20 Minutes    Cryotherapy Location  --   Right shoulder.   Type of Cryotherapy  Ice pack       Electrical Stimulation   Electrical Stimulation Location  Right shoulder    Electrical Stimulation Action  IFC    Electrical Stimulation Parameters  80-150 Hz x 20 minutes.      Ultrasound   Ultrasound Location  Right anterior shoulder.    Ultrasound Parameters  Performed U/S at 1.50 W/CM2 to patient's right anterior shoulder while performing a manual low load long duration stretch into ER in plane of the scapula x 10 minutes.             PT Education - 05/11/18 1041    Education Details  Instruct in post right post and ant cap stretch and reviewed corner stretch and doorway stretch to increase right shoulder flexion and ER.    Person(s) Educated  Patient       PT Short Term Goals - 02/02/18 1245      PT SHORT TERM GOAL #1   Title  STG=LTG        PT Long Term Goals - 04/11/18 1158  PT LONG TERM GOAL #1   Title  Patient will be independent with HEP and its progression    Time  4    Period  Weeks    Status  Achieved      PT LONG TERM GOAL #2   Title  Patient will demonstrate 150+ degrees of right shoulder flexion AROM to improve ability to perform functional tasks.    Time  4    Period  Weeks    Status  On-going      PT LONG TERM GOAL #3   Title  Patient will demonstrate 60+ degrees of right shoulder external rotation AROM to improve ability to don/doff apparel.    Time  4    Period  Weeks    Status  Achieved      PT LONG TERM GOAL #4   Title  Patient will demonstrate 4/5 or greater right shoulder MMT to improve stability during functional tasks.    Time  4    Period  Weeks    Status  On-going      PT LONG TERM GOAL #5   Title  Patient will demonstrate ability to perfrom all ADLs independently with pain less than 3/10 in right shoulder.    Time  4    Period  Weeks    Status  Achieved            Plan - 05/11/18 1039    Clinical Impression Statement  Focused on stretching today as the patient continues to exhibit right shoulder capsular  tightness and multi-directional tightness.      PT Treatment/Interventions  Cryotherapy;Ultrasound;Moist Heat;Electrical Stimulation;Iontophoresis 4mg /ml Dexamethasone;Functional mobility training;Neuromuscular re-education;Therapeutic exercise;Therapeutic activities;Patient/family education;Manual techniques;Passive range of motion;Scar mobilization;Vasopneumatic Device;ADLs/Self Care Home Management    PT Next Visit Plan  Continue with progressive and dynamic strengthening per POC.     PT Home Exercise Plan  see patient education section    Consulted and Agree with Plan of Care  Patient       Patient will benefit from skilled therapeutic intervention in order to improve the following deficits and impairments:  Pain, Decreased strength, Decreased range of motion, Impaired UE functional use  Visit Diagnosis: Acute pain of right shoulder  Stiffness of right shoulder, not elsewhere classified  Muscle weakness (generalized)     Problem List Patient Active Problem List   Diagnosis Date Noted  . BPH (benign prostatic hyperplasia) 11/25/2015  . Sprain and strain of metatarsophalangeal joint 04/02/2015  . Erectile dysfunction   . Diabetes mellitus ([place])   . Hyperlipidemia associated with type 2 diabetes mellitus ([place]) 05/25/2012  . HTN (hypertension) 05/25/2012    Jameka Ivie, [place] MPT 05/11/2018, 11:06 AM  [facility] [place], [place], [address] Phone: [phone]   Fax:  [phone]  Name: JARL SELLITTO MRN: [id number] Date of Birth: [birth year]

## 2018-05-16 ENCOUNTER — Ambulatory Visit: Payer: Medicare Other | Attending: Specialist | Admitting: Physical Therapy

## 2018-05-16 ENCOUNTER — Other Ambulatory Visit: Payer: Self-pay

## 2018-05-16 DIAGNOSIS — M25611 Stiffness of right shoulder, not elsewhere classified: Secondary | ICD-10-CM | POA: Diagnosis not present

## 2018-05-16 DIAGNOSIS — M6281 Muscle weakness (generalized): Secondary | ICD-10-CM | POA: Insufficient documentation

## 2018-05-16 DIAGNOSIS — M25511 Pain in right shoulder: Secondary | ICD-10-CM | POA: Diagnosis not present

## 2018-05-16 NOTE — Therapy (Signed)
[facility] [place], [place], [address] Phone: [phone]   Fax:  [phone]  Physical Therapy Treatment  Patient Details  Name: Tony Hodges MRN: [id number] Date of Birth: July 04, 1950 Referring Provider (PT): Sydnee Cabal, MD   Encounter Date: 05/16/2018  PT End of Session - 05/16/18 1038    Visit Number  34    Number of Visits  36    Date for PT Re-Evaluation  05/26/18    Authorization Type  FOTO; progress note every 10th visit     12th visit FOTO 55% limitation    PT Start Time  0946    PT Stop Time  1047    PT Time Calculation (min)  61 min    Activity Tolerance  Patient tolerated treatment well    Behavior During Therapy  [facility] for tasks assessed/performed       Past Medical History:  Diagnosis Date  . Diabetes mellitus   . Erectile dysfunction   . Hyperlipidemia   . Hypertension     Past Surgical History:  Procedure Laterality Date  . UMBILICAL HERNIA REPAIR  2010    There were no vitals filed for this visit.  Subjective Assessment - 05/16/18 1009    Subjective  COVID-19 screen performed prior to patient entering clinic.  Patient very pleased with progress.    Patient Stated Goals  improve movement, play golf    Currently in Pain?  Yes    Pain Score  1     Pain Location  Shoulder    Pain Orientation  Right    Pain Descriptors / Indicators  Sore    Pain Type  Surgical pain    Pain Onset  More than a month ago                       [facility] Adult PT Treatment/Exercise - 05/16/18 0001      Shoulder Exercises: ROM/Strengthening   Nustep  30 RPM's x 10 minutes.    Lat Pull Limitations  30# 3 sets to fatigue.    Cybex Press Limitations  20# 3 sets to fatigue.    Other ROM/Strengthening Exercises  Scap retraction on Apollo machine with 30# 3 sets to fatigue.    Other ROM/Strengthening Exercises  RW5 on Appollo pulleys (ER with 10#) other 4 with 20#  2 sets to fatigue.      Cryotherapy   Number Minutes Cryotherapy  20 Minutes    Cryotherapy Location  --   Right shoulder.   Type of Cryotherapy  Ice pack      Electrical Stimulation   Electrical Stimulation Location  Right shoulder.    Electrical Stimulation Action  IFC    Electrical Stimulation Parameters  80-150 Hz x 20 minutes.               PT Short Term Goals - 02/02/18 1245      PT SHORT TERM GOAL #1   Title  STG=LTG        PT Long Term Goals - 04/11/18 1158      PT LONG TERM GOAL #1   Title  Patient will be independent with HEP and its progression    Time  4    Period  Weeks    Status  Achieved      PT LONG TERM GOAL #2   Title  Patient will demonstrate 150+ degrees of right shoulder flexion AROM to improve ability to perform functional  tasks.    Time  4    Period  Weeks    Status  On-going      PT LONG TERM GOAL #3   Title  Patient will demonstrate 60+ degrees of right shoulder external rotation AROM to improve ability to don/doff apparel.    Time  4    Period  Weeks    Status  Achieved      PT LONG TERM GOAL #4   Title  Patient will demonstrate 4/5 or greater right shoulder MMT to improve stability during functional tasks.    Time  4    Period  Weeks    Status  On-going      PT LONG TERM GOAL #5   Title  Patient will demonstrate ability to perfrom all ADLs independently with pain less than 3/10 in right shoulder.    Time  4    Period  Weeks    Status  Achieved            Plan - 05/16/18 1059    Clinical Impression Statement  Excellnet prgression to weight machine today with no complaints.    PT Treatment/Interventions  Cryotherapy;Ultrasound;Moist Heat;Electrical Stimulation;Iontophoresis 4mg /ml Dexamethasone;Functional mobility training;Neuromuscular re-education;Therapeutic exercise;Therapeutic activities;Patient/family education;Manual techniques;Passive range of motion;Scar mobilization;Vasopneumatic Device;ADLs/Self Care Home Management    PT Next Visit  Plan  Continue with progressive and dynamic strengthening per POC.     PT Home Exercise Plan  see patient education section    Consulted and Agree with Plan of Care  Patient       Patient will benefit from skilled therapeutic intervention in order to improve the following deficits and impairments:  Pain, Decreased strength, Decreased range of motion, Impaired UE functional use  Visit Diagnosis: Acute pain of right shoulder  Stiffness of right shoulder, not elsewhere classified     Problem List Patient Active Problem List   Diagnosis Date Noted  . BPH (benign prostatic hyperplasia) 11/25/2015  . Sprain and strain of metatarsophalangeal joint 04/02/2015  . Erectile dysfunction   . Diabetes mellitus ([place])   . Hyperlipidemia associated with type 2 diabetes mellitus ([place]) 05/25/2012  . HTN (hypertension) 05/25/2012    , [place]  MPT 05/16/2018, 11:01 AM  [facility] [place], [place], [address] Phone: [phone]   Fax:  [phone]  Name: EDRIC FETTERMAN MRN: [id number] Date of Birth: 15-Sep-1950

## 2018-05-23 ENCOUNTER — Other Ambulatory Visit: Payer: Self-pay

## 2018-05-23 ENCOUNTER — Encounter: Payer: Self-pay | Admitting: Physical Therapy

## 2018-05-23 ENCOUNTER — Ambulatory Visit: Payer: Medicare Other | Admitting: Physical Therapy

## 2018-05-23 DIAGNOSIS — M25511 Pain in right shoulder: Secondary | ICD-10-CM

## 2018-05-23 DIAGNOSIS — M25611 Stiffness of right shoulder, not elsewhere classified: Secondary | ICD-10-CM

## 2018-05-23 DIAGNOSIS — M6281 Muscle weakness (generalized): Secondary | ICD-10-CM

## 2018-05-23 NOTE — Therapy (Signed)
[facility] [place], [place], [address] Phone: [phone]   Fax:  [phone]  Physical Therapy Treatment  Patient Details  Name: Tony Hodges MRN: [id number] Date of Birth: 03-07-1950 Referring Provider (PT): Sydnee Cabal, MD   Encounter Date: 05/23/2018  PT End of Session - 05/23/18 0949    Visit Number  35    Number of Visits  36    Date for PT Re-Evaluation  05/26/18    Authorization Type  FOTO; progress note every 10th visit     12th visit FOTO 55% limitation    PT Start Time  0948    PT Stop Time  1037    PT Time Calculation (min)  49 min    Activity Tolerance  Patient tolerated treatment well    Behavior During Therapy  [facility] for tasks assessed/performed       Past Medical History:  Diagnosis Date  . Diabetes mellitus   . Erectile dysfunction   . Hyperlipidemia   . Hypertension     Past Surgical History:  Procedure Laterality Date  . UMBILICAL HERNIA REPAIR  2010    There were no vitals filed for this visit.  Subjective Assessment - 05/23/18 0949    Subjective  COVID-19 screen performed prior to patient entering clinic. Goes back for MD appointment tomorrow.    Pertinent History  HTN, DM, R RTC 01/24/2018    Limitations  Lifting;House hold activities    Diagnostic tests  MRI, X-Ray    Patient Stated Goals  improve movement, play golf    Currently in Pain?  Yes    Pain Score  1     Pain Location  Shoulder    Pain Orientation  Right    Pain Descriptors / Indicators  Discomfort    Pain Type  Surgical pain    Pain Onset  More than a month ago    Pain Frequency  Intermittent         [facility] PT Assessment - 05/23/18 0001      Assessment   Medical Diagnosis  right shoulder scope SAD/DCR/biceps tenodesis RTC repair    Referring Provider (PT)  Sydnee Cabal, MD    Onset Date/Surgical Date  01/24/18    Hand Dominance  Left    Next MD Visit  05/24/2018    Prior Therapy  no      Precautions    Precautions  Shoulder    Type of Shoulder Precautions  RTC repair; see media for RTC repair [place] orth[facility]ics protocol      ROM / Strength   AROM / PROM / Strength  AROM;Strength      AROM   Overall AROM   Within functional limits for tasks performed    AROM Assessment Site  Shoulder    Right/Left Shoulder  Right    Right Shoulder Flexion  150 Degrees   Standing   Right Shoulder ABduction  153 Degrees    Right Shoulder Internal Rotation  55 Degrees    Right Shoulder External Rotation  60 Degrees      Strength   Overall Strength  Within functional limits for tasks performed    Strength Assessment Site  Shoulder    Right/Left Shoulder  Right    Right Shoulder Flexion  5/5    Right Shoulder ABduction  4+/5    Right Shoulder Internal Rotation  4+/5    Right Shoulder External Rotation  4+/5  [place] Adult PT Treatment/Exercise - 05/23/18 0001      Shoulder Exercises: Prone   Other Prone Exercises  Prone W back press 4# x15 reps   over ball   Other Prone Exercises  Prone 4# reverse scaption BUE over ball x20 reps      Shoulder Exercises: Standing   Protraction  Strengthening;Right;Weights;Limitations    Protraction Weight (lbs)  30    Protraction Limitations  3x10 reps    External Rotation  Strengthening;Right;Weights;Limitations    External Rotation Weight (lbs)  20    External Rotation Limitations  3x10 reps    Internal Rotation  Strengthening;Right;Weights;Limitations    Internal Rotation Weight (lbs)  50    Internal Rotation Limitations  3x10 reps    Extension  Strengthening;Right;Weights    Extension Weight (lbs)  40    Extension Limitations  3x10 reps    Row  Strengthening;Right;Weights;Limitations    Row Weight (lbs)  40    Row Limitations  3x10 reps    Diagonals  Strengthening;Right;20 reps;Weights    Diagonals Weight (lbs)  40# x12 reps D2 R shoulder, 50# R shoulder D2 into ext x30 reps      Shoulder Exercises: ROM/Strengthening    Nustep  30 RPM's x 10 minutes.    Lat Pull Limitations  30# 1x12 reps, 40# 3x10 reps    Cybex Press  Other (comment)   30# x10 reps, 40# x20 reps   Cybex Row  Other (comment)   40# 3x10 reps     Modalities   Modalities  Electrical Stimulation;Vasopneumatic      Electrical Stimulation   Electrical Stimulation Location  R shoulder    Electrical Stimulation Action  Pre-Mod    Electrical Stimulation Parameters  80-150 hz x15 min    Electrical Stimulation Goals  Other (comment)   post therex cool down     Vasopneumatic   Number Minutes Vasopneumatic   15 minutes    Vasopnuematic Location   Shoulder    Vasopneumatic Pressure  Low    Vasopneumatic Temperature   34               PT Short Term Goals - 02/02/18 1245      PT SHORT TERM GOAL #1   Title  STG=LTG        PT Long Term Goals - 05/23/18 1001      PT LONG TERM GOAL #1   Title  Patient will be independent with HEP and its progression    Time  4    Period  Weeks    Status  Achieved      PT LONG TERM GOAL #2   Title  Patient will demonstrate 150+ degrees of right shoulder flexion AROM to improve ability to perform functional tasks.    Time  4    Period  Weeks    Status  Achieved      PT LONG TERM GOAL #3   Title  Patient will demonstrate 60+ degrees of right shoulder external rotation AROM to improve ability to don/doff apparel.    Time  4    Period  Weeks    Status  Achieved      PT LONG TERM GOAL #4   Title  Patient will demonstrate 4/5 or greater right shoulder MMT to improve stability during functional tasks.    Time  4    Period  Weeks    Status  Achieved      PT LONG TERM GOAL #5  Title  Patient will demonstrate ability to perfrom all ADLs independently with pain less than 3/10 in right shoulder.    Time  4    Period  Weeks    Status  Achieved            Plan - 05/23/18 1034    Clinical Impression Statement  Patient has progressed very well following arthroscopic R RCR in 01/2018.  Patient has progressed through protocol and now able to progress to Apollo machine strengthening without complaints. Patient still weaker in ER and educated regarding the anatomy of the RTC for explanation. Patient's goals assessed today with patient meeting all goals set at evaluation. Patient able to hit golfballs within his yard with no issues per patient report. Patient able to touch L shoulder only a very minimal lack of horizontal adduction and able to reach belt line functionally for IR. Normal modalities response noted following removal of the modalities.    Stability/Clinical Decision Making  Stable/Uncomplicated    Rehab Potential  Excellent    Clinical Impairments Affecting Rehab Potential  surgery 01/24/18 11 weeks 04/11/18    PT Frequency  3x / week    PT Duration  4 weeks    PT Treatment/Interventions  Cryotherapy;Ultrasound;Moist Heat;Electrical Stimulation;Iontophoresis 4mg /ml Dexamethasone;Functional mobility training;Neuromuscular re-education;Therapeutic exercise;Therapeutic activities;Patient/family education;Manual techniques;Passive range of motion;Scar mobilization;Vasopneumatic Device;ADLs/Self Care Home Management    PT Next Visit Plan  Continue with progressive and dynamic strengthening per POC.     PT Home Exercise Plan  see patient education section    Consulted and Agree with Plan of Care  Patient       Patient will benefit from skilled therapeutic intervention in order to improve the following deficits and impairments:  Pain, Decreased strength, Decreased range of motion, Impaired UE functional use  Visit Diagnosis: Acute pain of right shoulder  Stiffness of right shoulder, not elsewhere classified  Muscle weakness (generalized)     Problem List Patient Active Problem List   Diagnosis Date Noted  . BPH (benign prostatic hyperplasia) 11/25/2015  . Sprain and strain of metatarsophalangeal joint 04/02/2015  . Erectile dysfunction   . Diabetes mellitus ([place])    . Hyperlipidemia associated with type 2 diabetes mellitus ([place]) 05/25/2012  . HTN (hypertension) 05/25/2012    Standley Brooking, PTA 05/23/18 10:41 AM   [facility] [place], [place], [address] Phone: [phone]   Fax:  [phone]  Name: MAHKI SPIKES MRN: [id number] Date of Birth: 11-20-1950

## 2018-05-24 DIAGNOSIS — M25511 Pain in right shoulder: Secondary | ICD-10-CM | POA: Diagnosis not present

## 2018-05-24 DIAGNOSIS — Z9889 Other specified postprocedural states: Secondary | ICD-10-CM | POA: Diagnosis not present

## 2018-05-30 ENCOUNTER — Other Ambulatory Visit: Payer: Self-pay

## 2018-05-30 ENCOUNTER — Ambulatory Visit: Payer: Medicare Other | Admitting: Physical Therapy

## 2018-05-30 DIAGNOSIS — M25511 Pain in right shoulder: Secondary | ICD-10-CM | POA: Diagnosis not present

## 2018-05-30 DIAGNOSIS — M6281 Muscle weakness (generalized): Secondary | ICD-10-CM | POA: Diagnosis not present

## 2018-05-30 DIAGNOSIS — M25611 Stiffness of right shoulder, not elsewhere classified: Secondary | ICD-10-CM | POA: Diagnosis not present

## 2018-05-30 NOTE — Therapy (Signed)
[facility] [place], [place], [address] Phone: [phone]   Fax:  [phone]  Physical Therapy Treatment  Patient Details  Name: Tony Hodges MRN: [id number] Date of Birth: 1950/06/02 Referring Provider (PT): Sydnee Cabal, MD   Encounter Date: 05/30/2018  PT End of Session - 05/30/18 1118    Visit Number  36    Number of Visits  36    Date for PT Re-Evaluation  05/26/18    Authorization Type  FOTO; progress note every 10th visit     12th visit FOTO 55% limitation    PT Start Time  0948    PT Stop Time  1037    PT Time Calculation (min)  49 min    Activity Tolerance  Patient tolerated treatment well    Behavior During Therapy  [facility] for tasks assessed/performed       Past Medical History:  Diagnosis Date  . Diabetes mellitus   . Erectile dysfunction   . Hyperlipidemia   . Hypertension     Past Surgical History:  Procedure Laterality Date  . UMBILICAL HERNIA REPAIR  2010    There were no vitals filed for this visit.  Subjective Assessment - 05/30/18 1117    Subjective  COVID-19 screen performed prior to patient entering clinic.  Doctor was very pleased.  I'm allowed to go back to golf one month earlier than expected.    Pertinent History  HTN, DM, R RTC 01/24/2018    Limitations  Lifting;House hold activities    Diagnostic tests  MRI, X-Ray    Patient Stated Goals  improve movement, play golf    Currently in Pain?  Yes    Pain Score  1     Pain Location  Shoulder    Pain Orientation  Right    Pain Descriptors / Indicators  Discomfort    Pain Onset  More than a month ago                       [facility] Adult PT Treatment/Exercise - 05/30/18 0001      Exercises   Exercises  Shoulder      Shoulder Exercises: ROM/Strengthening   Nustep  30 RPM's x 10 minutes.      Acupuncturist Location  Right shoulder.    Electrical Stimulation Action  IFC    Electrical Stimulation Parameters  80-150 Hz x 20 minutes.      Vasopneumatic   Number Minutes Vasopneumatic   20 minutes    Vasopnuematic Location   --   Right shoulder.   Vasopneumatic Pressure  Medium      Manual Therapy   Passive ROM  1-1 sustained stretching to right shoulder anterior capsule x 5 minutes and then posterior capsule x 5 minutes while receiving U/S at 1.50 W/CM2 x 10 minutes.               PT Short Term Goals - 02/02/18 1245      PT SHORT TERM GOAL #1   Title  STG=LTG        PT Long Term Goals - 05/23/18 1001      PT LONG TERM GOAL #1   Title  Patient will be independent with HEP and its progression    Time  4    Period  Weeks    Status  Achieved      PT LONG TERM GOAL #2   Title  Patient will demonstrate 150+ degrees of right shoulder flexion AROM to improve ability to perform functional tasks.    Time  4    Period  Weeks    Status  Achieved      PT LONG TERM GOAL #3   Title  Patient will demonstrate 60+ degrees of right shoulder external rotation AROM to improve ability to don/doff apparel.    Time  4    Period  Weeks    Status  Achieved      PT LONG TERM GOAL #4   Title  Patient will demonstrate 4/5 or greater right shoulder MMT to improve stability during functional tasks.    Time  4    Period  Weeks    Status  Achieved      PT LONG TERM GOAL #5   Title  Patient will demonstrate ability to perfrom all ADLs independently with pain less than 3/10 in right shoulder.    Time  4    Period  Weeks    Status  Achieved            Plan - 05/30/18 1122    Clinical Impression Statement  Patient has done an excellent job.  His doctor is releasing him to play golf a month ahead of schedule.  He continues to have right shoulder ant/post capsular restrictions but he is much improved and is very complinat to his HEP.    Stability/Clinical Decision Making  Stable/Uncomplicated    PT Next Visit Plan  Discharge next visit.    PT Home Exercise  Plan  see patient education section    Consulted and Agree with Plan of Care  Patient       Patient will benefit from skilled therapeutic intervention in order to improve the following deficits and impairments:  Pain, Decreased strength, Decreased range of motion, Impaired UE functional use  Visit Diagnosis: Stiffness of right shoulder, not elsewhere classified  Acute pain of right shoulder  Muscle weakness (generalized)     Problem List Patient Active Problem List   Diagnosis Date Noted  . BPH (benign prostatic hyperplasia) 11/25/2015  . Sprain and strain of metatarsophalangeal joint 04/02/2015  . Erectile dysfunction   . Diabetes mellitus ([place])   . Hyperlipidemia associated with type 2 diabetes mellitus ([place]) 05/25/2012  . HTN (hypertension) 05/25/2012    Dinna Severs, [place] MPT 05/30/2018, 11:26 AM  [facility] [place], [place], [address] Phone: [phone]   Fax:  [phone]  Name: AYDIN HINK MRN: [id number] Date of Birth: 13-Sep-1950

## 2018-06-06 ENCOUNTER — Encounter: Payer: Medicare Other | Admitting: Physical Therapy

## 2018-06-06 ENCOUNTER — Ambulatory Visit: Payer: Medicare Other | Admitting: Physical Therapy

## 2018-06-06 ENCOUNTER — Other Ambulatory Visit: Payer: Self-pay

## 2018-06-06 ENCOUNTER — Encounter: Payer: Self-pay | Admitting: Physical Therapy

## 2018-06-06 DIAGNOSIS — M6281 Muscle weakness (generalized): Secondary | ICD-10-CM | POA: Diagnosis not present

## 2018-06-06 DIAGNOSIS — M25511 Pain in right shoulder: Secondary | ICD-10-CM

## 2018-06-06 DIAGNOSIS — M25611 Stiffness of right shoulder, not elsewhere classified: Secondary | ICD-10-CM

## 2018-06-06 NOTE — Therapy (Signed)
[facility] [place], [place], [address] Phone: [phone]   Fax:  [phone]  Physical Therapy Treatment PHYSICAL THERAPY DISCHARGE SUMMARY  Visits from Start of Care: 37  Current functional level related to goals / functional outcomes: See below   Remaining deficits: See goals   Education / Equipment: HEP Plan: Patient agrees to discharge.  Patient goals were met. Patient is being discharged due to meeting the stated rehab goals.  ?????    [name], PT, DPT  Patient Details  Name: Tony Hodges MRN: [id number] Date of Birth: 1950-04-24 Referring Provider (PT): Sydnee Cabal, MD   Encounter Date: 06/06/2018  PT End of Session - 06/06/18 1017    Visit Number  37    Number of Visits  36    Date for PT Re-Evaluation  05/26/18    Authorization Type  FOTO; progress note every 10th visit     12th visit FOTO 55% limitation    PT Start Time  1016    PT Stop Time  1059    PT Time Calculation (min)  43 min    Activity Tolerance  Patient tolerated treatment well    Behavior During Therapy  WFL for tasks assessed/performed       Past Medical History:  Diagnosis Date  . Diabetes mellitus   . Erectile dysfunction   . Hyperlipidemia   . Hypertension     Past Surgical History:  Procedure Laterality Date  . UMBILICAL HERNIA REPAIR  2010    There were no vitals filed for this visit.  Subjective Assessment - 06/06/18 1017    Subjective  COVID 19 screening performed on patient prior to entering building. Reports that he should begin playing golf on June 14th. Reports that he works the shoulder "all the time."    Pertinent History  HTN, DM, R RTC 01/24/2018    Limitations  Lifting;House hold activities    Diagnostic tests  MRI, X-Ray    Patient Stated Goals  improve movement, play golf    Currently in Pain?  Yes    Pain Score  --   "a little"   Pain Location  Shoulder    Pain Orientation  Right     Pain Descriptors / Indicators  Discomfort    Pain Type  Surgical pain    Pain Onset  More than a month ago    Pain Frequency  Intermittent         [facility] PT Assessment - 06/06/18 0001      Assessment   Medical Diagnosis  right shoulder scope SAD/DCR/biceps tenodesis RTC repair    Referring Provider (PT)  Sydnee Cabal, MD    Onset Date/Surgical Date  01/24/18    Hand Dominance  Left    Next MD Visit  07/05/2018    Prior Therapy  no      Precautions   Precautions  Shoulder    Type of Shoulder Precautions  RTC repair; see media for RTC repair [place] orthopedics protocol                   [name] - 06/06/18 0001      Shoulder Exercises: Standing   Protraction  Strengthening;Right;Weights;Limitations    Protraction Weight (lbs)  50    Protraction Limitations  3x10 reps    External Rotation  Strengthening;Right;Weights;Limitations    External Rotation Weight (lbs)  20    External Rotation Limitations  3x10 reps  Internal Rotation  Strengthening;Right;Weights;Limitations    Internal Rotation Weight (lbs)  50    Internal Rotation Limitations  3x10 reps    Extension  Strengthening;Right;Weights    Extension Weight (lbs)  50    Extension Limitations  3x10 reps    Row  Strengthening;Right;Weights;Limitations    Row Weight (lbs)  50    Row Limitations  3x10 reps    Other Standing Exercises  Lat pulldown 40# x10 reps, 50# x30 reps    Other Standing Exercises  Chest press 50# x30 reps, R shoulder 90/90 4# x20 reps      Shoulder Exercises: ROM/Strengthening   Nustep  30 RPM x8 min    Wall Pushups  20 reps   NBOS, WBOS each     Modalities   Modalities  Electrical Stimulation;Vasopneumatic      Electrical Stimulation   Electrical Stimulation Location  R shoulder    Electrical Stimulation Action  Pre-Mod    Electrical Stimulation Parameters  80-150 hz x15 min    Electrical Stimulation Goals  Other (comment)   post therex cool down      Vasopneumatic   Number Minutes Vasopneumatic   15 minutes    Vasopnuematic Location   Shoulder    Vasopneumatic Pressure  Medium    Vasopneumatic Temperature   34               PT Short Term Goals - 02/02/18 1245      PT SHORT TERM GOAL #1   Title  STG=LTG        PT Long Term Goals - 05/23/18 1001      PT LONG TERM GOAL #1   Title  Patient will be independent with HEP and its progression    Time  4    Period  Weeks    Status  Achieved      PT LONG TERM GOAL #2   Title  Patient will demonstrate 150+ degrees of right shoulder flexion AROM to improve ability to perform functional tasks.    Time  4    Period  Weeks    Status  Achieved      PT LONG TERM GOAL #3   Title  Patient will demonstrate 60+ degrees of right shoulder external rotation AROM to improve ability to don/doff apparel.    Time  4    Period  Weeks    Status  Achieved      PT LONG TERM GOAL #4   Title  Patient will demonstrate 4/5 or greater right shoulder MMT to improve stability during functional tasks.    Time  4    Period  Weeks    Status  Achieved      PT LONG TERM GOAL #5   Title  Patient will demonstrate ability to perfrom all ADLs independently with pain less than 3/10 in right shoulder.    Time  4    Period  Weeks    Status  Achieved              Patient will benefit from skilled therapeutic intervention in order to improve the following deficits and impairments:     Visit Diagnosis: Acute pain of right shoulder  Stiffness of right shoulder, not elsewhere classified  Muscle weakness (generalized)     Problem List Patient Active Problem List   Diagnosis Date Noted  . BPH (benign prostatic hyperplasia) 11/25/2015  . Sprain and strain of metatarsophalangeal joint 04/02/2015  . Erectile dysfunction   .  Diabetes mellitus ([place])   . Hyperlipidemia associated with type 2 diabetes mellitus ([place]) 05/25/2012  . HTN (hypertension) 05/25/2012    Standley Brooking,  PTA 06/06/18 11:05 AM   [facility] [place], [place], [address] Phone: [phone]   Fax:  [phone]  Name: CANDLER GINSBERG MRN: [id number] Date of Birth: [birth year]

## 2018-07-05 DIAGNOSIS — M25511 Pain in right shoulder: Secondary | ICD-10-CM | POA: Diagnosis not present

## 2018-07-05 DIAGNOSIS — Z9889 Other specified postprocedural states: Secondary | ICD-10-CM | POA: Diagnosis not present

## 2018-08-04 ENCOUNTER — Other Ambulatory Visit: Payer: Self-pay

## 2018-08-07 ENCOUNTER — Ambulatory Visit (INDEPENDENT_AMBULATORY_CARE_PROVIDER_SITE_OTHER): Payer: Medicare Other | Admitting: Family Medicine

## 2018-08-07 ENCOUNTER — Encounter: Payer: Self-pay | Admitting: Family Medicine

## 2018-08-07 ENCOUNTER — Other Ambulatory Visit: Payer: Self-pay

## 2018-08-07 VITALS — BP 132/65 | HR 69 | Temp 97.3°F | Ht 71.25 in | Wt 179.4 lb

## 2018-08-07 DIAGNOSIS — R3912 Poor urinary stream: Secondary | ICD-10-CM

## 2018-08-07 DIAGNOSIS — E785 Hyperlipidemia, unspecified: Secondary | ICD-10-CM

## 2018-08-07 DIAGNOSIS — M79672 Pain in left foot: Secondary | ICD-10-CM | POA: Diagnosis not present

## 2018-08-07 DIAGNOSIS — E0869 Diabetes mellitus due to underlying condition with other specified complication: Secondary | ICD-10-CM | POA: Diagnosis not present

## 2018-08-07 DIAGNOSIS — N401 Enlarged prostate with lower urinary tract symptoms: Secondary | ICD-10-CM | POA: Diagnosis not present

## 2018-08-07 DIAGNOSIS — I10 Essential (primary) hypertension: Secondary | ICD-10-CM | POA: Diagnosis not present

## 2018-08-07 DIAGNOSIS — E1169 Type 2 diabetes mellitus with other specified complication: Secondary | ICD-10-CM | POA: Diagnosis not present

## 2018-08-07 LAB — BAYER DCA HB A1C WAIVED: HB A1C (BAYER DCA - WAIVED): 6.6 % (ref <7.0)

## 2018-08-07 MED ORDER — METHYLPREDNISOLONE ACETATE 40 MG/ML IJ SUSP
40.0000 mg | Freq: Once | INTRAMUSCULAR | Status: AC
  Administered 2018-08-07: 40 mg via INTRAMUSCULAR

## 2018-08-07 NOTE — Progress Notes (Signed)
BP 132/65   Pulse 69   Temp (!) 97.3 F (36.3 C) (Temporal)   Ht 5' 11.25" (1.81 m)   Wt 179 lb 6.4 oz (81.4 kg)   BMI 24.85 kg/m    Subjective:   Patient ID: Tony Hodges, male    DOB: 1950-05-12, 68 y.o.   MRN: [id number]  HPI: Tony Hodges is a 68 y.o. male presenting on 08/07/2018 for Hyperlipidemia (3 month follow up), Diabetes, and Plantar Fasciitis (left- x 2 months)   HPI Type 2 diabetes mellitus Patient comes in today for recheck of his diabetes. Patient has been currently taking Janumet and Actos. Patient is currently on an ACE inhibitor/ARB. Patient has not seen an ophthalmologist this year. Patient denies any issues with their feet.   Hyperlipidemia Patient is coming in for recheck of his hyperlipidemia. The patient is currently taking atorvastatin. They deny any issues with myalgias or history of liver damage from it. They deny any focal numbness or weakness or chest pain.   Hypertension Patient is currently on amlodipine-benazepril, and their blood pressure today is 132/65. Patient denies any lightheadedness or dizziness. Patient denies headaches, blurred vision, chest pains, shortness of breath, or weakness. Denies any side effects from medication and is content with current medication.   Left heel pain has been bothering him for 2 months.  It hurts when he is up and on his feet more and it is stiff in the mornings when he first wakes up.  The pain is near the base of his left heel.  He denies any redness or warmth but does have a little bit of swelling there.  Relevant past medical, surgical, family and social history reviewed and updated as indicated. Interim medical history since our last visit reviewed. Allergies and medications reviewed and updated.  Review of Systems  Constitutional: Negative for chills and fever.  Eyes: Negative for visual disturbance.  Respiratory: Negative for shortness of breath and wheezing.   Cardiovascular: Negative for chest  pain and leg swelling.  Musculoskeletal: Positive for arthralgias. Negative for back pain and gait problem.  Skin: Negative for rash.  All other systems reviewed and are negative.   Per HPI unless specifically indicated above   Allergies as of 08/07/2018   No Known Allergies     Medication List       Accurate as of August 07, 2018 11:59 PM. If you have any questions, ask your nurse or doctor.        amLODipine-benazepril 5-10 MG capsule Commonly known as: LOTREL Take 1 capsule by mouth daily.   atorvastatin 20 MG tablet Commonly known as: LIPITOR Take 0.5 tablets (10 mg total) by mouth at bedtime.   diclofenac sodium 1 % Gel Commonly known as: Voltaren Apply 2 g topically 4 (four) times daily.   glucose blood test strip Commonly known as: ONE TOUCH ULTRA TEST Use as instructed to check blood sugar   Longs Adult Low Strength ASA 81 MG EC tablet Generic drug: aspirin Take 81 mg by mouth daily.   onetouch ultrasoft lancets Check blood sugar bid. Dx E11.9   pioglitazone 30 MG tablet Commonly known as: ACTOS Take 1 tablet (30 mg total) by mouth daily.   sitaGLIPtin-metformin 50-1000 MG tablet Commonly known as: Janumet Take 1 tablet by mouth 2 (two) times daily with a meal.   traZODone 50 MG tablet Commonly known as: DESYREL TAKE 0.5-1 TABLETS (25-50 MG TOTAL) BY MOUTH AT BEDTIME AS NEEDED FOR SLEEP.  Objective:   BP 132/65   Pulse 69   Temp (!) 97.3 F (36.3 C) (Temporal)   Ht 5' 11.25" (1.81 m)   Wt 179 lb 6.4 oz (81.4 kg)   BMI 24.85 kg/m   Wt Readings from Last 3 Encounters:  08/07/18 179 lb 6.4 oz (81.4 kg)  01/02/18 188 lb 6.4 oz (85.5 kg)  10/03/17 183 lb 12.8 oz (83.4 kg)    Physical Exam Vitals signs and nursing note reviewed.  Constitutional:      General: He is not in acute distress.    Appearance: He is well-developed. He is not diaphoretic.  Eyes:     General: No scleral icterus.    Conjunctiva/sclera: Conjunctivae normal.   Neck:     Thyroid: No thyromegaly.  Cardiovascular:     Rate and Rhythm: Normal rate and regular rhythm.     Heart sounds: Normal heart sounds. No murmur.  Pulmonary:     Effort: Pulmonary effort is normal. No respiratory distress.     Breath sounds: Normal breath sounds. No wheezing.  Musculoskeletal: Normal range of motion.     Left foot: Normal range of motion. Tenderness present.       Feet:  Neurological:     Mental Status: He is alert and oriented to person, place, and time.     Coordination: Coordination normal.  Psychiatric:        Behavior: Behavior normal.    Left heel injection: Consent form signed. Risk factors of bleeding and infection discussed with patient and patient is agreeable towards injection. Patient prepped with Betadine. Lateral approach towards injection used. Injected 40 mg of Depo-Medrol and 1 mL of 2% lidocaine. Patient tolerated procedure well and no side effects from noted. Minimal to no bleeding. Simple bandage applied after.   Assessment & Plan:   Problem List Items Addressed This Visit      Cardiovascular and Mediastinum   HTN (hypertension)   Relevant Orders   CMP14+EGFR (Completed)   Bayer DCA Hb A1c Waived (Completed)     Endocrine   Hyperlipidemia associated with type 2 diabetes mellitus (HCC) - Primary   Relevant Orders   Lipid panel (Completed)   Diabetes mellitus (HCC)   Relevant Orders   CBC with Differential/Platelet (Completed)   CMP14+EGFR (Completed)   Bayer DCA Hb A1c Waived (Completed)     Genitourinary   BPH (benign prostatic hyperplasia)    Other Visit Diagnoses    Left foot pain       Relevant Medications   methylPREDNISolone acetate (DEPO-MEDROL) injection 40 mg (Completed)      Continue current medications include amlodipine-benazepril and atorvastatin and Actos and Janumet, he also uses trazodone as needed for sleep. Follow up plan: Return in about 3 months (around 11/07/2018), or if symptoms worsen or fail to  improve, for Diabetes recheck.  Counseling provided for all of the vaccine components Orders Placed This Encounter  Procedures  . CBC with Differential/Platelet  . CMP14+EGFR  . Lipid panel  . Bayer DCA Hb A1c Waived    Joshua Dettinger, MD [facility] 08/10/2018, 9:48 PM     

## 2018-08-08 LAB — CMP14+EGFR
ALT: 16 IU/L (ref 0–44)
AST: 19 IU/L (ref 0–40)
Albumin/Globulin Ratio: 1.8 (ref 1.2–2.2)
Albumin: 4.4 g/dL (ref 3.8–4.8)
Alkaline Phosphatase: 60 IU/L (ref 39–117)
BUN/Creatinine Ratio: 16 (ref 10–24)
BUN: 20 mg/dL (ref 8–27)
Bilirubin Total: 0.3 mg/dL (ref 0.0–1.2)
CO2: 26 mmol/L (ref 20–29)
Calcium: 9.3 mg/dL (ref 8.6–10.2)
Chloride: 102 mmol/L (ref 96–106)
Creatinine, Ser: 1.23 mg/dL (ref 0.76–1.27)
GFR calc Af Amer: 70 mL/min/{1.73_m2} (ref >59)
GFR calc non Af Amer: 60 mL/min/{1.73_m2} (ref >59)
Globulin, Total: 2.4 g/dL (ref 1.5–4.5)
Glucose: 109 mg/dL — ABNORMAL HIGH (ref 65–99)
Potassium: 4.7 mmol/L (ref 3.5–5.2)
Sodium: 140 mmol/L (ref 134–144)
Total Protein: 6.8 g/dL (ref 6.0–8.5)

## 2018-08-08 LAB — CBC WITH DIFFERENTIAL/PLATELET
Basophils Absolute: 0.1 10*3/uL (ref 0.0–0.2)
Basos: 1 %
EOS (ABSOLUTE): 0.1 10*3/uL (ref 0.0–0.4)
Eos: 2 %
Hematocrit: 42.9 % (ref 37.5–51.0)
Hemoglobin: 13.6 g/dL (ref 13.0–17.7)
Immature Grans (Abs): 0 10*3/uL (ref 0.0–0.1)
Immature Granulocytes: 0 %
Lymphocytes Absolute: 1.5 10*3/uL (ref 0.7–3.1)
Lymphs: 29 %
MCH: 28.7 pg (ref 26.6–33.0)
MCHC: 31.7 g/dL (ref 31.5–35.7)
MCV: 91 fL (ref 79–97)
Monocytes Absolute: 0.5 10*3/uL (ref 0.1–0.9)
Monocytes: 9 %
Neutrophils Absolute: 3.1 10*3/uL (ref 1.4–7.0)
Neutrophils: 59 %
Platelets: 234 10*3/uL (ref 150–450)
RBC: 4.74 x10E6/uL (ref 4.14–5.80)
RDW: 12.5 % (ref 11.6–15.4)
WBC: 5.3 10*3/uL (ref 3.4–10.8)

## 2018-08-08 LAB — LIPID PANEL
Chol/HDL Ratio: 2.4 ratio (ref 0.0–5.0)
Cholesterol, Total: 129 mg/dL (ref 100–199)
HDL: 53 mg/dL (ref >39)
LDL Calculated: 59 mg/dL (ref 0–99)
Triglycerides: 84 mg/dL (ref 0–149)
VLDL Cholesterol Cal: 17 mg/dL (ref 5–40)

## 2018-08-14 ENCOUNTER — Telehealth: Payer: Self-pay | Admitting: Family Medicine

## 2018-08-14 NOTE — Telephone Encounter (Signed)
Patient aware of lab results.

## 2018-08-16 DIAGNOSIS — M25511 Pain in right shoulder: Secondary | ICD-10-CM | POA: Diagnosis not present

## 2018-08-16 DIAGNOSIS — Z9889 Other specified postprocedural states: Secondary | ICD-10-CM | POA: Diagnosis not present

## 2018-08-21 ENCOUNTER — Telehealth: Payer: Self-pay | Admitting: Family Medicine

## 2018-08-21 MED ORDER — PIOGLITAZONE HCL 30 MG PO TABS: 30.0000 mg | ORAL_TABLET | Freq: Every day | ORAL | 0 refills | Status: DC

## 2018-08-21 NOTE — Telephone Encounter (Signed)
5 day supply of actos sent to [facility].  Patient aware

## 2018-10-10 ENCOUNTER — Other Ambulatory Visit: Payer: Self-pay | Admitting: Family Medicine

## 2018-11-07 ENCOUNTER — Other Ambulatory Visit: Payer: Self-pay

## 2018-11-08 ENCOUNTER — Encounter: Payer: Self-pay | Admitting: Family Medicine

## 2018-11-08 ENCOUNTER — Ambulatory Visit (INDEPENDENT_AMBULATORY_CARE_PROVIDER_SITE_OTHER): Payer: Medicare Other | Admitting: Family Medicine

## 2018-11-08 VITALS — BP 133/77 | HR 67 | Temp 96.9°F | Ht 71.25 in | Wt 177.8 lb

## 2018-11-08 DIAGNOSIS — E785 Hyperlipidemia, unspecified: Secondary | ICD-10-CM | POA: Diagnosis not present

## 2018-11-08 DIAGNOSIS — Z23 Encounter for immunization: Secondary | ICD-10-CM | POA: Diagnosis not present

## 2018-11-08 DIAGNOSIS — E0869 Diabetes mellitus due to underlying condition with other specified complication: Secondary | ICD-10-CM | POA: Diagnosis not present

## 2018-11-08 DIAGNOSIS — I10 Essential (primary) hypertension: Secondary | ICD-10-CM | POA: Diagnosis not present

## 2018-11-08 DIAGNOSIS — E1169 Type 2 diabetes mellitus with other specified complication: Secondary | ICD-10-CM | POA: Diagnosis not present

## 2018-11-08 LAB — BAYER DCA HB A1C WAIVED: HB A1C (BAYER DCA - WAIVED): 6.2 % (ref <7.0)

## 2018-11-08 MED ORDER — JANUMET 50-1000 MG PO TABS: 1.0000 | ORAL_TABLET | Freq: Two times a day (BID) | ORAL | 3 refills | Status: DC

## 2018-11-08 MED ORDER — ATORVASTATIN CALCIUM 20 MG PO TABS: 10.0000 mg | ORAL_TABLET | Freq: Every day | ORAL | 3 refills | Status: DC

## 2018-11-08 MED ORDER — PIOGLITAZONE HCL 30 MG PO TABS: 30.0000 mg | ORAL_TABLET | Freq: Every day | ORAL | 3 refills | Status: DC

## 2018-11-08 MED ORDER — AMLODIPINE BESY-BENAZEPRIL HCL 5-10 MG PO CAPS: 1.0000 | ORAL_CAPSULE | Freq: Every day | ORAL | 3 refills | Status: DC

## 2018-11-08 NOTE — Progress Notes (Signed)
BP 133/77   Pulse 67   Temp (!) 96.9 F (36.1 C) (Temporal)   Ht 5' 11.25" (1.81 m)   Wt 177 lb 12.8 oz (80.6 kg)   SpO2 98%   BMI 24.62 kg/m    Subjective:   Patient ID: Tony Hodges, male    DOB: May 15, 1950, 68 y.o.   MRN: [id number]  HPI: Tony Hodges is a 68 y.o. male presenting on 11/08/2018 for Diabetes (3 month) and Hyperlipidemia   HPI Type 2 diabetes mellitus Patient comes in today for recheck of his diabetes. Patient has been currently taking Actos and Janumet. Patient is currently on an ACE inhibitor/ARB. Patient has not seen an ophthalmologist this year. Patient denies any issues with their feet.   Hyperlipidemia Patient is coming in for recheck of his hyperlipidemia. The patient is currently taking Lipitor. They deny any issues with myalgias or history of liver damage from it. They deny any focal numbness or weakness or chest pain.   Hypertension Patient is currently on amlodipine-benazepril, and their blood pressure today is 133/77. Patient denies any lightheadedness or dizziness. Patient denies headaches, blurred vision, chest pains, shortness of breath, or weakness. Denies any side effects from medication and is content with current medication.   Fasciitis left foot Patient says he has been having fasciitis of his left foot again like he had previously.  He just wanted to mention that it was bothering him but he does not want to do an injection today because he does want to get his flu vaccine and he will reschedule for a 4 weeks in the future to get it done.  Is not as bad as what he had previously but it is starting to increase some and he wants to get it before it gets as bad as previous.  He is still using ice water bottle and bought a new wrap for his ankle that is helping a lot.  Relevant past medical, surgical, family and social history reviewed and updated as indicated. Interim medical history since our last visit reviewed. Allergies and medications  reviewed and updated.  Review of Systems  Constitutional: Negative for chills and fever.  Eyes: Negative for visual disturbance.  Respiratory: Negative for shortness of breath and wheezing.   Cardiovascular: Negative for chest pain and leg swelling.  Musculoskeletal: Positive for arthralgias. Negative for back pain and gait problem.  Skin: Negative for rash.  Neurological: Negative for dizziness, weakness and light-headedness.  All other systems reviewed and are negative.   Per HPI unless specifically indicated above   Allergies as of 11/08/2018      Reactions   Other       Medication List       Accurate as of November 08, 2018 10:00 AM. If you have any questions, ask your nurse or doctor.        amLODipine-benazepril 5-10 MG capsule Commonly known as: LOTREL Take 1 capsule by mouth daily.   atorvastatin 20 MG tablet Commonly known as: LIPITOR Take 0.5 tablets (10 mg total) by mouth at bedtime.   diclofenac sodium 1 % Gel Commonly known as: VOLTAREN APPLY 2 GRAMS TO AFFECTED AREA 4 TIMES A DAY   glucose blood test strip Commonly known as: ONE TOUCH ULTRA TEST Use as instructed to check blood sugar   Longs Adult Low Strength ASA 81 MG EC tablet Generic drug: aspirin Take 81 mg by mouth daily.   onetouch ultrasoft lancets Check blood sugar bid. Dx E11.9  pioglitazone 30 MG tablet Commonly known as: ACTOS Take 1 tablet (30 mg total) by mouth daily.   sitaGLIPtin-metformin 50-1000 MG tablet Commonly known as: Janumet Take 1 tablet by mouth 2 (two) times daily with a meal.   traZODone 50 MG tablet Commonly known as: DESYREL TAKE 0.5-1 TABLETS (25-50 MG TOTAL) BY MOUTH AT BEDTIME AS NEEDED FOR SLEEP.        Objective:   BP 133/77   Pulse 67   Temp (!) 96.9 F (36.1 C) (Temporal)   Ht 5' 11.25" (1.81 m)   Wt 177 lb 12.8 oz (80.6 kg)   SpO2 98%   BMI 24.62 kg/m   Wt Readings from Last 3 Encounters:  11/08/18 177 lb 12.8 oz (80.6 kg)  08/07/18  179 lb 6.4 oz (81.4 kg)  01/02/18 188 lb 6.4 oz (85.5 kg)    Physical Exam Vitals signs and nursing note reviewed.  Constitutional:      General: He is not in acute distress.    Appearance: He is well-developed. He is not diaphoretic.  Eyes:     General: No scleral icterus.    Conjunctiva/sclera: Conjunctivae normal.  Neck:     Musculoskeletal: Neck supple.     Thyroid: No thyromegaly.  Cardiovascular:     Rate and Rhythm: Normal rate and regular rhythm.     Heart sounds: Normal heart sounds. No murmur.  Pulmonary:     Effort: Pulmonary effort is normal. No respiratory distress.     Breath sounds: Normal breath sounds. No wheezing.  Musculoskeletal: Normal range of motion.  Lymphadenopathy:     Cervical: No cervical adenopathy.  Skin:    General: Skin is warm and dry.     Findings: No rash.  Neurological:     Mental Status: He is alert and oriented to person, place, and time.     Coordination: Coordination normal.  Psychiatric:        Behavior: Behavior normal.       Assessment & Plan:   Problem List Items Addressed This Visit      Cardiovascular and Mediastinum   HTN (hypertension)   Relevant Medications   amLODipine-benazepril (LOTREL) 5-10 MG capsule   atorvastatin (LIPITOR) 20 MG tablet     Endocrine   Hyperlipidemia associated with type 2 diabetes mellitus (HCC)   Relevant Medications   amLODipine-benazepril (LOTREL) 5-10 MG capsule   sitaGLIPtin-metformin (JANUMET) 50-1000 MG tablet   pioglitazone (ACTOS) 30 MG tablet   atorvastatin (LIPITOR) 20 MG tablet   Diabetes mellitus (HCC) - Primary   Relevant Medications   amLODipine-benazepril (LOTREL) 5-10 MG capsule   sitaGLIPtin-metformin (JANUMET) 50-1000 MG tablet   pioglitazone (ACTOS) 30 MG tablet   atorvastatin (LIPITOR) 20 MG tablet   Other Relevant Orders   Bayer DCA Hb A1c Waived (Completed)    Other Visit Diagnoses    Need for immunization against influenza       Relevant Orders   Flu  Vaccine QUAD High Dose(Fluad) (Completed)    Instructed patient that he can go ahead and start cutting his Actos in half because his A1c is 6.2 and we will see how it goes in the future. Patient will manage plantar fasciitis of his left foot conservatively and may want injection in the future but since he is getting a flu vaccine he will not get the injection today.  It is greatly improved from previous. Follow up plan: Return in about 3 months (around 02/08/2019), or if symptoms worsen or fail to  improve, for Diabetes and cholesterol.  Counseling provided for all of the vaccine components Orders Placed This Encounter  Procedures  . Bayer [facility] Hb A1c [place], MD [facility] 11/08/2018, 10:00 AM

## 2018-11-09 ENCOUNTER — Telehealth: Payer: Self-pay | Admitting: Family Medicine

## 2018-11-09 NOTE — Telephone Encounter (Signed)
Apt scheduled.  

## 2018-11-09 NOTE — Telephone Encounter (Signed)
[email or web address]

## 2018-11-30 DIAGNOSIS — M25521 Pain in right elbow: Secondary | ICD-10-CM | POA: Insufficient documentation

## 2018-12-01 DIAGNOSIS — G5621 Lesion of ulnar nerve, right upper limb: Secondary | ICD-10-CM | POA: Insufficient documentation

## 2018-12-01 DIAGNOSIS — M25521 Pain in right elbow: Secondary | ICD-10-CM | POA: Diagnosis not present

## 2018-12-11 ENCOUNTER — Other Ambulatory Visit: Payer: Self-pay

## 2018-12-11 ENCOUNTER — Ambulatory Visit (INDEPENDENT_AMBULATORY_CARE_PROVIDER_SITE_OTHER): Payer: Medicare Other | Admitting: Family Medicine

## 2018-12-11 ENCOUNTER — Encounter: Payer: Self-pay | Admitting: Family Medicine

## 2018-12-11 VITALS — BP 131/67 | HR 67 | Temp 96.9°F | Ht 71.25 in | Wt 181.8 lb

## 2018-12-11 DIAGNOSIS — M722 Plantar fascial fibromatosis: Secondary | ICD-10-CM | POA: Diagnosis not present

## 2018-12-11 MED ORDER — METHYLPREDNISOLONE ACETATE 40 MG/ML IJ SUSP
40.0000 mg | Freq: Once | INTRAMUSCULAR | Status: AC
  Administered 2018-12-11: 10:00:00 40 mg via INTRAMUSCULAR

## 2018-12-11 MED ORDER — METHYLPREDNISOLONE ACETATE 80 MG/ML IJ SUSP
40.0000 mg | Freq: Once | INTRAMUSCULAR | Status: AC
  Administered 2019-12-13: 40 mg via INTRAMUSCULAR

## 2018-12-11 NOTE — Addendum Note (Signed)
Addended by: Nigel Berthold C on: 12/11/2018 09:51 AM   Modules accepted: Orders

## 2018-12-11 NOTE — Progress Notes (Signed)
   BP 131/67   Pulse 67   Temp (!) 96.9 F (36.1 C) (Temporal)   Ht 5' 11.25" (1.81 m)   Wt 181 lb 12.8 oz (82.5 kg)   SpO2 98%   BMI 25.18 kg/m    Subjective:   Patient ID: Tony Hodges, male    DOB: 14-Nov-1950, 68 y.o.   MRN: [id number]  HPI: Tony Hodges is a 68 y.o. male presenting on 12/11/2018 for heel injection (left- Patient states he has been having left heel pain since June)   HPI Is coming in for reinjection of left plantar fasciitis.  He still having some issues with that although much improved from the previous injections.  He had a previous injection earlier in the year around July.  He denies any worsening but just not completely improving.  He does golf and that is probably where it bothers him the most although he is still able to do it.  The pain is just under his left heel   Relevant past medical, surgical, family and social history reviewed and updated as indicated. Interim medical history since our last visit reviewed. Allergies and medications reviewed and updated.  Review of Systems  Constitutional: Negative for chills and fever.  Respiratory: Negative for shortness of breath and wheezing.   Cardiovascular: Negative for chest pain and leg swelling.  Musculoskeletal: Positive for arthralgias. Negative for gait problem and joint swelling.  All other systems reviewed and are negative.   Per HPI unless specifically indicated above      Objective:   BP 131/67   Pulse 67   Temp (!) 96.9 F (36.1 C) (Temporal)   Ht 5' 11.25" (1.81 m)   Wt 181 lb 12.8 oz (82.5 kg)   SpO2 98%   BMI 25.18 kg/m   Wt Readings from Last 3 Encounters:  12/11/18 181 lb 12.8 oz (82.5 kg)  11/08/18 177 lb 12.8 oz (80.6 kg)  08/07/18 179 lb 6.4 oz (81.4 kg)    Physical Exam Musculoskeletal:     Left foot: Normal range of motion and normal capillary refill. Tenderness present. No bony tenderness.       Feet:     Left plantar fasciitis injection: Consent form  signed. Risk factors of bleeding and infection discussed with patient and patient is agreeable towards injection. Patient prepped with Betadine. Lateral approach towards injection used. Injected 40 mg of Depo-Medrol and 1 mL of 2% lidocaine. Patient tolerated procedure well and no side effects from noted. Minimal to no bleeding. Simple bandage applied after.   Assessment & Plan:   Problem List Items Addressed This Visit    None    Visit Diagnoses    Plantar fasciitis of left foot    -  Primary   Relevant Medications   methylPREDNISolone acetate (DEPO-MEDROL) injection 40 mg       Follow up plan: No follow-ups on file.  Counseling provided for all of the vaccine components No orders of the defined types were placed in this encounter.   Caryl Pina, MD [facility] 12/11/2018, 9:02 AM

## 2018-12-20 ENCOUNTER — Ambulatory Visit: Payer: Medicare Other | Admitting: Family Medicine

## 2018-12-30 IMAGING — DX DG KNEE 1-2V*R*
2 series · 2 of 2 positions shown · non-contrast
Comparison: None.

CLINICAL DATA: Fall.  Acute right knee pain.

EXAM:
RIGHT KNEE - 1-2 VIEW

[knee ap]
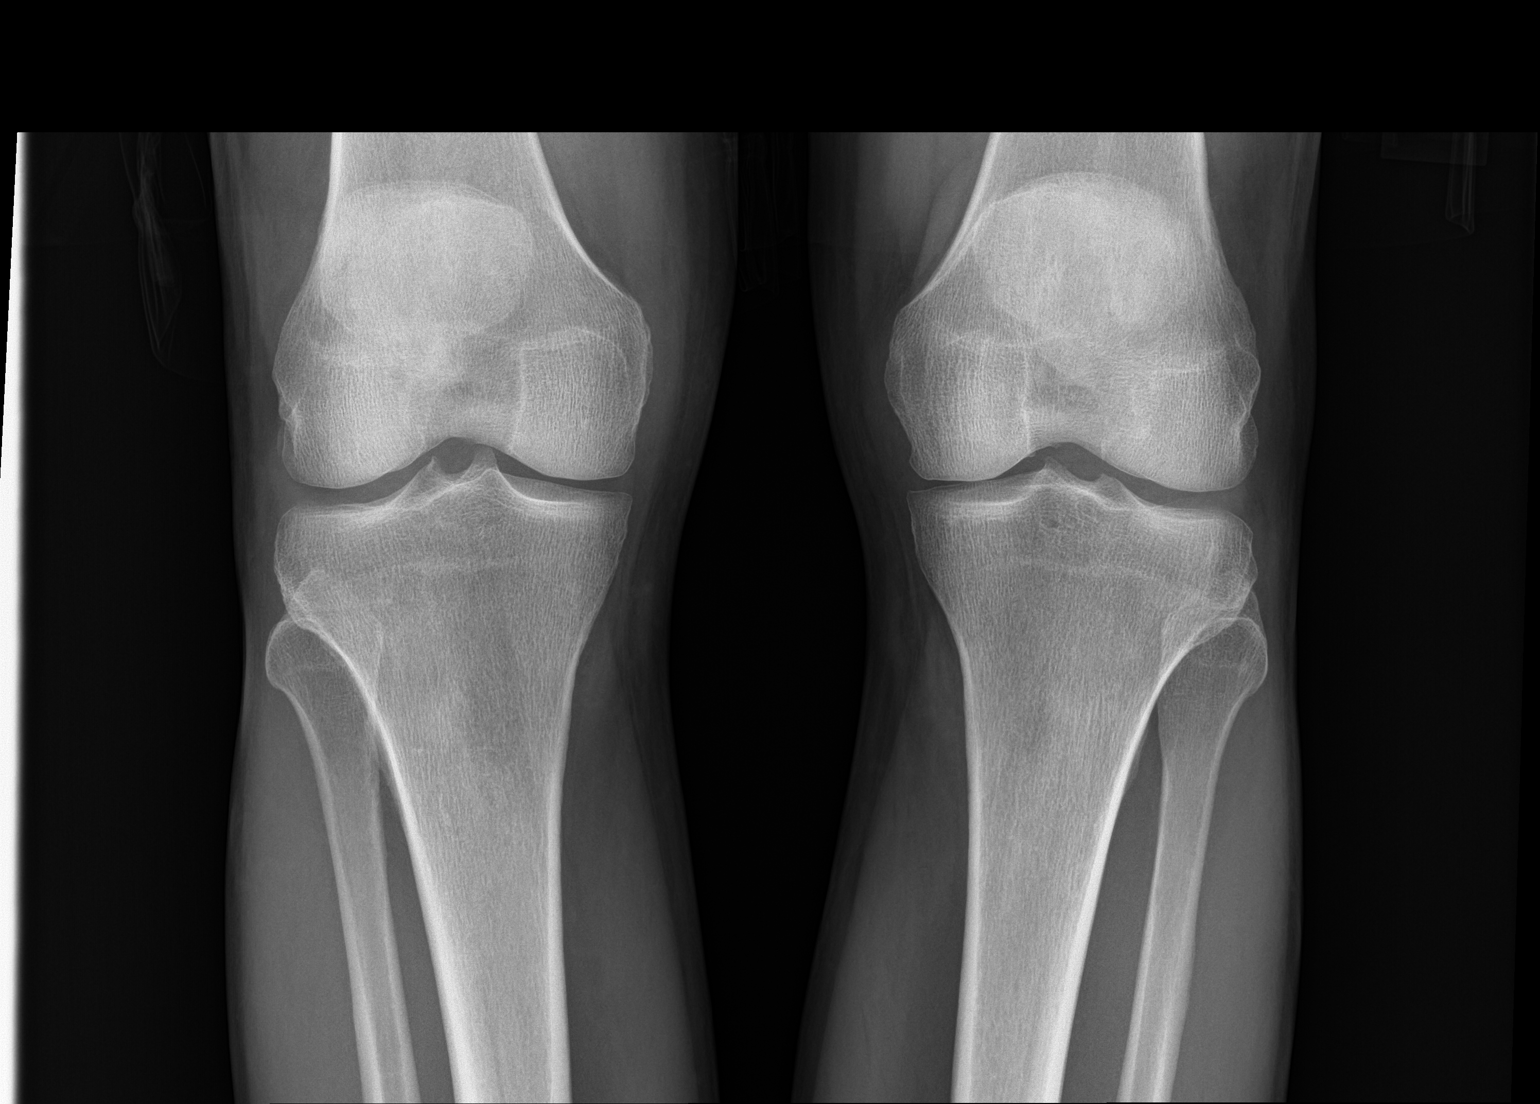

[knee lat]
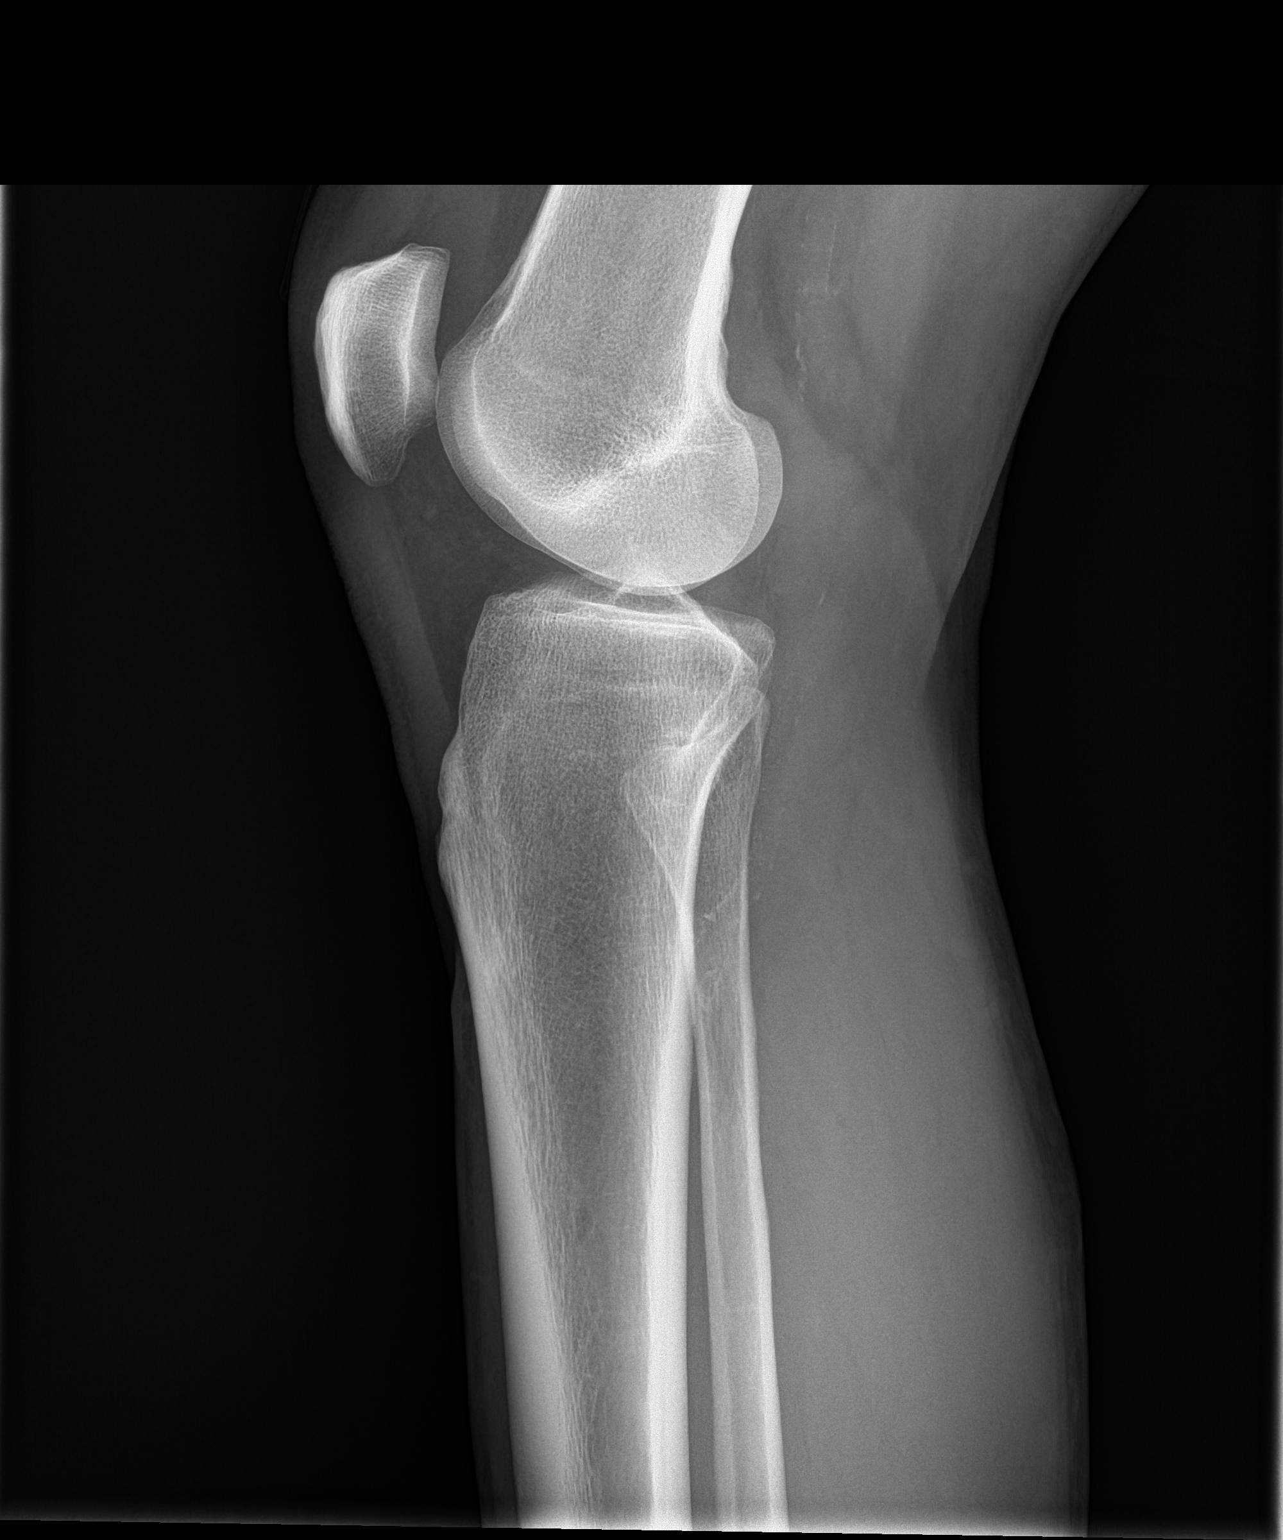

[2 of 2 positions shown; findings below may reference images not displayed]

FINDINGS: Small right knee joint effusion noted. No evidence fracture or
dislocation. No evidence of arthropathy or other focal bone
abnormality. Mild peripheral vascular calcification is seen
posteriorly .
IMPRESSION: Small right knee joint effusion.  No evidence of fracture.

## 2019-01-31 ENCOUNTER — Ambulatory Visit: Payer: Medicare Other | Attending: Internal Medicine

## 2019-01-31 DIAGNOSIS — Z23 Encounter for immunization: Secondary | ICD-10-CM | POA: Insufficient documentation

## 2019-01-31 NOTE — Progress Notes (Signed)
   Covid-19 Vaccination Clinic  Name:  Tony Hodges    MRN: [id number] DOB: 22-Jul-1950  01/31/2019  Tony Hodges was observed post Covid-19 immunization for 15 minutes without incidence. He was provided with Vaccine Information Sheet and instruction to access the V-Safe system.   Tony Hodges was instructed to call 911 with any severe reactions post vaccine: Marland Kitchen Difficulty breathing  . Swelling of your face and throat  . A fast heartbeat  . A bad rash all over your body  . Dizziness and weakness    Immunizations Administered    Name Date Dose VIS Date Route   Pfizer COVID-19 Vaccine 01/31/2019  2:36 PM 0.3 mL 12/22/2018 Intramuscular   Manufacturer: [place]   Lot: [id number]   [place]: [id number]

## 2019-02-13 ENCOUNTER — Ambulatory Visit (INDEPENDENT_AMBULATORY_CARE_PROVIDER_SITE_OTHER): Payer: Medicare Other | Admitting: Family Medicine

## 2019-02-13 ENCOUNTER — Encounter: Payer: Self-pay | Admitting: Family Medicine

## 2019-02-13 DIAGNOSIS — E1169 Type 2 diabetes mellitus with other specified complication: Secondary | ICD-10-CM | POA: Diagnosis not present

## 2019-02-13 DIAGNOSIS — E785 Hyperlipidemia, unspecified: Secondary | ICD-10-CM | POA: Diagnosis not present

## 2019-02-13 DIAGNOSIS — E0869 Diabetes mellitus due to underlying condition with other specified complication: Secondary | ICD-10-CM

## 2019-02-13 DIAGNOSIS — I10 Essential (primary) hypertension: Secondary | ICD-10-CM | POA: Diagnosis not present

## 2019-02-13 NOTE — Progress Notes (Signed)
Virtual Visit via telephone Note  I connected with Anette Riedel on 02/13/19 at [phone] by telephone and verified that I am speaking with the correct person using two identifiers. ALMOND FRIES is currently located at home and no other people are currently with her during visit. The provider, Fransisca Kaufmann Lorik Guo, MD is located in their office at time of visit.  Call ended at [phone]  I discussed the limitations, risks, security and privacy concerns of performing an evaluation and management service by telephone and the availability of in person appointments. I also discussed with the patient that there may be a patient responsible charge related to this service. The patient expressed understanding and agreed to proceed.  137/76,  History and Present Illness: Type 2 diabetes mellitus Patient comes in today for recheck of his diabetes. Patient has been currently taking janumet and actos and blood sugar is 113 and averag. Patient is currently on an ACE inhibitor/ARB. Patient has not seen an ophthalmologist this year. Patient denies any issues with their feet.   Hypertension Patient is currently on amlodipine and benazepril, and their blood pressure today is 137/76. Patient denies any lightheadedness or dizziness. Patient denies headaches, blurred vision, chest pains, shortness of breath, or weakness. Denies any side effects from medication and is content with current medication.   Hyperlipidemia Patient is coming in for recheck of his hyperlipidemia. The patient is currently taking lipitor. They deny any issues with myalgias or history of liver damage from it. They deny any focal numbness or weakness or chest pain.    No diagnosis found.  Outpatient Encounter Medications as of 02/13/2019  Medication Sig  . amLODipine-benazepril (LOTREL) 5-10 MG capsule Take 1 capsule by mouth daily.  Marland Kitchen aspirin (LONGS ADULT LOW STRENGTH ASA) 81 MG EC tablet Take 81 mg by mouth daily.    Marland Kitchen atorvastatin (LIPITOR)  20 MG tablet Take 0.5 tablets (10 mg total) by mouth at bedtime.  . diclofenac sodium (VOLTAREN) 1 % GEL APPLY 2 GRAMS TO AFFECTED AREA 4 TIMES A DAY  . glucose blood (ONE TOUCH ULTRA TEST) test strip Use as instructed to check blood sugar  . Lancets (ONETOUCH ULTRASOFT) lancets Check blood sugar bid. Dx E11.9  . pioglitazone (ACTOS) 30 MG tablet Take 1 tablet (30 mg total) by mouth daily.  . sitaGLIPtin-metformin (JANUMET) 50-1000 MG tablet Take 1 tablet by mouth 2 (two) times daily with a meal.  . traZODone (DESYREL) 50 MG tablet TAKE 0.5-1 TABLETS (25-50 MG TOTAL) BY MOUTH AT BEDTIME AS NEEDED FOR SLEEP.   Facility-Administered Encounter Medications as of 02/13/2019  Medication  . methylPREDNISolone acetate (DEPO-MEDROL) injection 40 mg    Review of Systems  Constitutional: Negative for chills and fever.  Eyes: Negative for visual disturbance.  Respiratory: Negative for shortness of breath and wheezing.   Cardiovascular: Negative for chest pain and leg swelling.  Musculoskeletal: Negative for arthralgias, back pain and gait problem.  Skin: Negative for color change and rash.  Neurological: Negative for dizziness and light-headedness.  All other systems reviewed and are negative.   Observations/Objective: Patient sounds comfortable and in no acute distress  Assessment and Plan: Problem List Items Addressed This Visit      Cardiovascular and Mediastinum   HTN (hypertension)     Endocrine   Hyperlipidemia associated with type 2 diabetes mellitus ([place]) - Primary   Diabetes mellitus ([place])      Continue current medications, no changes  Follow up plan: Return in about 3 months (  around 05/13/2019), or if symptoms worsen or fail to improve, for diabetes and htn.  Had 1st vaccine for covid    I discussed the assessment and treatment plan with the patient. The patient was provided an opportunity to ask questions and all were answered. The patient agreed with the plan and demonstrated  an understanding of the instructions.   The patient was advised to call back or seek an in-person evaluation if the symptoms worsen or if the condition fails to improve as anticipated.  The above assessment and management plan was discussed with the patient. The patient verbalized understanding of and has agreed to the management plan. Patient is aware to call the clinic if symptoms persist or worsen. Patient is aware when to return to the clinic for a follow-up visit. Patient educated on when it is appropriate to go to the emergency department.    I provided 12 minutes of non-face-to-face time during this encounter.    Worthy Rancher, MD

## 2019-02-19 ENCOUNTER — Ambulatory Visit: Payer: Medicare Other

## 2019-02-21 ENCOUNTER — Ambulatory Visit: Payer: Medicare Other | Attending: Internal Medicine

## 2019-02-21 DIAGNOSIS — Z23 Encounter for immunization: Secondary | ICD-10-CM

## 2019-02-21 NOTE — Progress Notes (Signed)
   Covid-19 Vaccination Clinic  Name:  Tony Hodges    MRN: [id number] DOB: [birth year]  02/21/2019  Mr. Skillern was observed post Covid-19 immunization for 15 minutes without incidence. He was provided with Vaccine Information Sheet and instruction to access the V-Safe system.   Mr. Vanous was instructed to call 911 with any severe reactions post vaccine: Marland Kitchen Difficulty breathing  . Swelling of your face and throat  . A fast heartbeat  . A bad rash all over your body  . Dizziness and weakness    Immunizations Administered    Name Date Dose VIS Date Route   Pfizer COVID-19 Vaccine 02/21/2019  4:21 PM 0.3 mL 12/22/2018 Intramuscular   Manufacturer: [place]   Lot: [id number]   [place]: [id number]

## 2019-02-27 ENCOUNTER — Ambulatory Visit: Payer: Medicare Other

## 2019-03-16 DIAGNOSIS — G5621 Lesion of ulnar nerve, right upper limb: Secondary | ICD-10-CM | POA: Diagnosis not present

## 2019-03-16 DIAGNOSIS — M79601 Pain in right arm: Secondary | ICD-10-CM | POA: Diagnosis not present

## 2019-03-27 DIAGNOSIS — G5621 Lesion of ulnar nerve, right upper limb: Secondary | ICD-10-CM | POA: Diagnosis not present

## 2019-04-03 DIAGNOSIS — G5621 Lesion of ulnar nerve, right upper limb: Secondary | ICD-10-CM | POA: Diagnosis not present

## 2019-04-12 DIAGNOSIS — E119 Type 2 diabetes mellitus without complications: Secondary | ICD-10-CM | POA: Diagnosis not present

## 2019-04-23 DIAGNOSIS — G5621 Lesion of ulnar nerve, right upper limb: Secondary | ICD-10-CM | POA: Diagnosis not present

## 2019-04-23 DIAGNOSIS — G5601 Carpal tunnel syndrome, right upper limb: Secondary | ICD-10-CM | POA: Diagnosis not present

## 2019-04-27 DIAGNOSIS — G5621 Lesion of ulnar nerve, right upper limb: Secondary | ICD-10-CM | POA: Diagnosis not present

## 2019-05-08 DIAGNOSIS — M79672 Pain in left foot: Secondary | ICD-10-CM | POA: Diagnosis not present

## 2019-05-08 DIAGNOSIS — M2041 Other hammer toe(s) (acquired), right foot: Secondary | ICD-10-CM | POA: Diagnosis not present

## 2019-05-08 DIAGNOSIS — M722 Plantar fascial fibromatosis: Secondary | ICD-10-CM | POA: Diagnosis not present

## 2019-05-08 DIAGNOSIS — M79671 Pain in right foot: Secondary | ICD-10-CM | POA: Diagnosis not present

## 2019-05-14 ENCOUNTER — Ambulatory Visit: Payer: Medicare Other | Admitting: Family Medicine

## 2019-05-21 ENCOUNTER — Other Ambulatory Visit: Payer: Self-pay

## 2019-05-21 ENCOUNTER — Encounter: Payer: Self-pay | Admitting: Family Medicine

## 2019-05-21 ENCOUNTER — Ambulatory Visit (INDEPENDENT_AMBULATORY_CARE_PROVIDER_SITE_OTHER): Payer: Medicare Other | Admitting: Family Medicine

## 2019-05-21 VITALS — BP 118/66 | HR 73 | Temp 98.2°F | Ht 71.0 in | Wt 186.5 lb

## 2019-05-21 DIAGNOSIS — R7989 Other specified abnormal findings of blood chemistry: Secondary | ICD-10-CM | POA: Diagnosis not present

## 2019-05-21 DIAGNOSIS — R3912 Poor urinary stream: Secondary | ICD-10-CM

## 2019-05-21 DIAGNOSIS — E785 Hyperlipidemia, unspecified: Secondary | ICD-10-CM | POA: Diagnosis not present

## 2019-05-21 DIAGNOSIS — N401 Enlarged prostate with lower urinary tract symptoms: Secondary | ICD-10-CM | POA: Diagnosis not present

## 2019-05-21 DIAGNOSIS — I10 Essential (primary) hypertension: Secondary | ICD-10-CM

## 2019-05-21 DIAGNOSIS — E1169 Type 2 diabetes mellitus with other specified complication: Secondary | ICD-10-CM | POA: Diagnosis not present

## 2019-05-21 LAB — BAYER DCA HB A1C WAIVED: HB A1C (BAYER DCA - WAIVED): 7.1 % — ABNORMAL HIGH (ref <7.0)

## 2019-05-21 NOTE — Progress Notes (Signed)
BP 118/66   Pulse 73   Temp 98.2 F (36.8 C)   Ht '5\' 11"'  (1.803 m)   Wt 84.6 kg   SpO2 98%   BMI 26.01 kg/m    Subjective:   Patient ID: Tony Hodges, male    DOB: [birth year], 69 y.o.   MRN: [id number]  HPI: Tony Hodges is a 69 y.o. male presenting on 05/21/2019 for T2DM, HTN, And Hyperlipidemia Follow Up.  1. T2DM:  Tony Hodges is currently taking Janumet and Pioglitazone for his T2DM. He checks his blood sugars pretty regularly in the morning and states that they average about 112. He had a diabetic eye exam about a month ago and he is due for his diabetic foot exam today. Tony Hodges denies any fatigue, weight gain/loss, dizziness, nausea, vomiting, and dysuria. Tony Hodges has recently had two episodes of ulnar neuritis which he has seen Ortho about. He currently reports some numbness on the medial side of his right forearm and a little above his elbow.  2. HTN:  Tony Hodges is currently taking 1  Amlodipine-Benazepril 5-10 mg capsule QD for his HTN. He reports that he checks his BP at home sporadically and that it is usually in the 130s/60s at home. Tony Hodges BP in clinic today was 118/66. He denies headaches, dizziness, shortness of breath, and coughing.  3. Hyperlipidemia:  Tony Hodges is currently taking Atorvastatin 10 mg QD for his Hyperlipidemia. He stays active with golf and walks a lot when he plays golf.  Relevant past medical, surgical, family and social history reviewed and updated as indicated. Interim medical history since our last visit reviewed.  Allergies and medications reviewed and updated.  Review of Systems  Constitutional: Negative for fatigue and unexpected weight change.  Respiratory: Negative for cough and shortness of breath.   Cardiovascular: Negative for chest pain.  Gastrointestinal: Negative for constipation, diarrhea, nausea and vomiting.  Genitourinary: Negative for difficulty urinating and dysuria.  Neurological: Positive for numbness  (Medial right forearm and a little above the elbow). Negative for dizziness and headaches.   Per HPI unless specifically indicated above  Allergies as of 05/21/2019      Reactions   Other       Medication List      - Accurate as of May 21, 2019 11:46 AM. If you have any questions, ask your nurse or doctor.        amLODipine-benazepril 5-10 MG capsule Commonly known as: LOTREL Take 1 capsule by mouth daily.   atorvastatin 20 MG tablet Commonly known as: LIPITOR Take 0.5 tablets (10 mg total) by mouth at bedtime.   cholecalciferol 25 MCG (1000 UNIT) tablet Commonly known as: VITAMIN D3 Take 2,000 Units by mouth daily.   diclofenac sodium 1 % Gel Commonly known as: VOLTAREN APPLY 2 GRAMS TO AFFECTED AREA 4 TIMES A DAY   glucose blood test strip Commonly known as: ONE TOUCH ULTRA TEST Use as instructed to check blood sugar   Janumet 50-1000 MG tablet Generic drug: sitaGLIPtin-metformin Take 1 tablet by mouth 2 (two) times daily with a meal.   [place] Adult Low Strength ASA 81 MG EC tablet Generic drug: aspirin Take 81 mg by mouth daily.   multivitamin capsule Take 1 capsule by mouth daily.   onetouch ultrasoft lancets Check blood sugar bid. Dx E11.9   pioglitazone 30 MG tablet Commonly known as: ACTOS Take 1 tablet (30 mg total) by mouth daily.   traZODone 50 MG tablet Commonly  known as: DESYREL TAKE 0.5-1 TABLETS (25-50 MG TOTAL) BY MOUTH AT BEDTIME AS NEEDED FOR SLEEP.   vitamin C with rose hips 500 MG tablet Take 500 mg by mouth daily.   zinc gluconate 50 MG tablet Take 50 mg by mouth daily.       Objective:   BP 118/66   Pulse 73   Temp 98.2 F (36.8 C)   Ht '5\' 11"'  (1.803 m)   Wt 84.6 kg   SpO2 98%   BMI 26.01 kg/m   Wt Readings from Last 3 Encounters:  05/21/19 84.6 kg  12/11/18 82.5 kg  11/08/18 80.6 kg    Physical Exam Constitutional:      General: He is not in acute distress.    Appearance: Normal appearance.  Neck:     Thyroid:  No thyromegaly or thyroid tenderness.  Cardiovascular:     Rate and Rhythm: Normal rate and regular rhythm.     Comments: S1/S2 auscultated. Radial and DP pulses palpated bilaterally. Pulmonary:     Effort: Pulmonary effort is normal.     Breath sounds: Normal breath sounds. No wheezing, rhonchi or rales.  Skin:    General: Skin is warm and dry.  Neurological:     Mental Status: He is alert and oriented to person, place, and time.     Coordination: Coordination normal.     Gait: Gait normal.  Psychiatric:        Mood and Affect: Mood normal.        Behavior: Behavior normal.        Thought Content: Thought content normal.        Judgment: Judgment normal.      Assessment & Plan:   Problem List Items Addressed This Visit      Cardiovascular and Mediastinum   HTN (hypertension)   Relevant Orders   CBC with Differential/Platelet (Completed)   ABO AND RH  (Completed)     Endocrine   Hyperlipidemia associated with type 2 diabetes mellitus ([place]) - Primary   Relevant Orders   CMP14+EGFR (Completed)   Lipid panel (Completed)   ABO AND RH  (Completed)   Diabetes mellitus ([place])   Relevant Orders   Bayer DCA Hb A1c Waived (Completed)   CBC with Differential/Platelet (Completed)   CMP14+EGFR (Completed)   ABO AND RH  (Completed)     Genitourinary   BPH (benign prostatic hyperplasia)   Relevant Orders   PSA, total and free (Completed)   ABO AND RH  (Completed)    Other Visit Diagnoses    Elevated serum creatinine       Relevant Orders   Basic Metabolic Panel      1. T2DM:  Tony Hodges A1C is 7.1 today in clinic, which is up from 6.2 in October 2020. Continue current medication regimen of Janumet and Pioglitazone. Tony Hodges is advised to continue with his exercise while playing golf but to also watch his diet carefully and avoid sugary drinks, excess carbs, etc. Will recheck his A1C at his next follow up appointment in about 3 months.  2. HTN:  Tony Hodges HTN is  well-controlled with Amlodipine-Benazepril, so will continue this medication regimen. Continue to monitor BP occasionally at home. Will check CMP today.  3. Hyperlipidemia:  Continue with Atorvastatin for Hyperlipidemia medication regimen. Will check Lipid Panel today.  4. Health Maintenance/BPH:  Will order PSA and CBC with the bloodwork today. Tony Hodges was also requesting to know his blood type, so will also  order ABO and Rh blood test.  Follow up plan: Return in about 3 months (around 08/21/2019), or if symptoms worsen or fail to improve, for diabetes and hld.   Orders Placed This Encounter  Procedures  . Bayer DCA Hb A1c Waived  . CBC with Differential/Platelet  . CMP14+EGFR  . Lipid panel  . PSA, total and free  . ABO AND RH     Gaynelle Arabian, PA-S2 [facility] 05/21/2019, 11:46 AM  Patient seen and examined with Gaynelle Arabian, PA student, agree with assessment and plan above.  Patient's A1c is up, we discussed diet and exercise and he will focus on his and to improve things.  No other change medication. Caryl Pina, MD [facility] 05/27/2019, 8:40 PM

## 2019-05-21 NOTE — Patient Instructions (Signed)

## 2019-05-22 LAB — CBC WITH DIFFERENTIAL/PLATELET
Basophils Absolute: 0.1 10*3/uL (ref 0.0–0.2)
Basos: 1 %
EOS (ABSOLUTE): 0.1 10*3/uL (ref 0.0–0.4)
Eos: 2 %
Hematocrit: 42.1 % (ref 37.5–51.0)
Hemoglobin: 14.1 g/dL (ref 13.0–17.7)
Immature Grans (Abs): 0 10*3/uL (ref 0.0–0.1)
Immature Granulocytes: 0 %
Lymphocytes Absolute: 1.7 10*3/uL (ref 0.7–3.1)
Lymphs: 32 %
MCH: 30.3 pg (ref 26.6–33.0)
MCHC: 33.5 g/dL (ref 31.5–35.7)
MCV: 91 fL (ref 79–97)
Monocytes Absolute: 0.4 10*3/uL (ref 0.1–0.9)
Monocytes: 8 %
Neutrophils Absolute: 3 10*3/uL (ref 1.4–7.0)
Neutrophils: 57 %
Platelets: 228 10*3/uL (ref 150–450)
RBC: 4.65 x10E6/uL (ref 4.14–5.80)
RDW: 12.8 % (ref 11.6–15.4)
WBC: 5.3 10*3/uL (ref 3.4–10.8)

## 2019-05-22 LAB — ABO AND RH: Rh Factor: POSITIVE

## 2019-05-22 LAB — PSA, TOTAL AND FREE
PSA, Free Pct: 30 %
PSA, Free: 0.42 ng/mL
Prostate Specific Ag, Serum: 1.4 ng/mL (ref 0.0–4.0)

## 2019-05-22 LAB — CMP14+EGFR
ALT: 16 IU/L (ref 0–44)
AST: 21 IU/L (ref 0–40)
Albumin/Globulin Ratio: 1.9 (ref 1.2–2.2)
Albumin: 4.5 g/dL (ref 3.8–4.8)
Alkaline Phosphatase: 64 IU/L (ref 39–117)
BUN/Creatinine Ratio: 12 (ref 10–24)
BUN: 18 mg/dL (ref 8–27)
Bilirubin Total: 0.4 mg/dL (ref 0.0–1.2)
CO2: 25 mmol/L (ref 20–29)
Calcium: 9.5 mg/dL (ref 8.6–10.2)
Chloride: 101 mmol/L (ref 96–106)
Creatinine, Ser: 1.46 mg/dL — ABNORMAL HIGH (ref 0.76–1.27)
GFR calc Af Amer: 56 mL/min/{1.73_m2} — ABNORMAL LOW (ref >59)
GFR calc non Af Amer: 49 mL/min/{1.73_m2} — ABNORMAL LOW (ref >59)
Globulin, Total: 2.4 g/dL (ref 1.5–4.5)
Glucose: 115 mg/dL — ABNORMAL HIGH (ref 65–99)
Potassium: 5.1 mmol/L (ref 3.5–5.2)
Sodium: 137 mmol/L (ref 134–144)
Total Protein: 6.9 g/dL (ref 6.0–8.5)

## 2019-05-22 LAB — LIPID PANEL
Chol/HDL Ratio: 2.3 ratio (ref 0.0–5.0)
Cholesterol, Total: 127 mg/dL (ref 100–199)
HDL: 55 mg/dL (ref >39)
LDL Chol Calc (NIH): 58 mg/dL (ref 0–99)
Triglycerides: 69 mg/dL (ref 0–149)
VLDL Cholesterol Cal: 14 mg/dL (ref 5–40)

## 2019-05-26 ENCOUNTER — Other Ambulatory Visit: Payer: Self-pay | Admitting: Family Medicine

## 2019-06-12 ENCOUNTER — Other Ambulatory Visit: Payer: Self-pay

## 2019-06-12 ENCOUNTER — Other Ambulatory Visit: Payer: Medicare Other

## 2019-06-12 DIAGNOSIS — R7989 Other specified abnormal findings of blood chemistry: Secondary | ICD-10-CM

## 2019-06-13 LAB — BASIC METABOLIC PANEL
BUN/Creatinine Ratio: 18 (ref 10–24)
BUN: 23 mg/dL (ref 8–27)
CO2: 24 mmol/L (ref 20–29)
Calcium: 9.5 mg/dL (ref 8.6–10.2)
Chloride: 103 mmol/L (ref 96–106)
Creatinine, Ser: 1.31 mg/dL — ABNORMAL HIGH (ref 0.76–1.27)
GFR calc Af Amer: 64 mL/min/{1.73_m2} (ref >59)
GFR calc non Af Amer: 56 mL/min/{1.73_m2} — ABNORMAL LOW (ref >59)
Glucose: 104 mg/dL — ABNORMAL HIGH (ref 65–99)
Potassium: 4.7 mmol/L (ref 3.5–5.2)
Sodium: 140 mmol/L (ref 134–144)

## 2019-06-20 ENCOUNTER — Encounter: Payer: Self-pay | Admitting: *Deleted

## 2019-08-22 ENCOUNTER — Ambulatory Visit: Payer: Medicare Other | Admitting: Family Medicine

## 2019-08-24 ENCOUNTER — Ambulatory Visit (INDEPENDENT_AMBULATORY_CARE_PROVIDER_SITE_OTHER): Payer: Medicare Other

## 2019-08-24 ENCOUNTER — Other Ambulatory Visit: Payer: Self-pay

## 2019-08-24 ENCOUNTER — Encounter: Payer: Self-pay | Admitting: Family Medicine

## 2019-08-24 ENCOUNTER — Telehealth (INDEPENDENT_AMBULATORY_CARE_PROVIDER_SITE_OTHER): Payer: Medicare Other | Admitting: Family Medicine

## 2019-08-24 DIAGNOSIS — M65352 Trigger finger, left little finger: Secondary | ICD-10-CM

## 2019-08-24 DIAGNOSIS — I10 Essential (primary) hypertension: Secondary | ICD-10-CM | POA: Diagnosis not present

## 2019-08-24 DIAGNOSIS — E785 Hyperlipidemia, unspecified: Secondary | ICD-10-CM | POA: Diagnosis not present

## 2019-08-24 DIAGNOSIS — E0869 Diabetes mellitus due to underlying condition with other specified complication: Secondary | ICD-10-CM

## 2019-08-24 DIAGNOSIS — E1169 Type 2 diabetes mellitus with other specified complication: Secondary | ICD-10-CM

## 2019-08-24 DIAGNOSIS — Z23 Encounter for immunization: Secondary | ICD-10-CM

## 2019-08-24 LAB — CMP14+EGFR
ALT: 15 IU/L (ref 0–44)
AST: 17 IU/L (ref 0–40)
Albumin/Globulin Ratio: 1.5 (ref 1.2–2.2)
Albumin: 4.3 g/dL (ref 3.8–4.8)
Alkaline Phosphatase: 63 IU/L (ref 48–121)
BUN/Creatinine Ratio: 18 (ref 10–24)
BUN: 26 mg/dL (ref 8–27)
Bilirubin Total: 0.3 mg/dL (ref 0.0–1.2)
CO2: 25 mmol/L (ref 20–29)
Calcium: 9.4 mg/dL (ref 8.6–10.2)
Chloride: 102 mmol/L (ref 96–106)
Creatinine, Ser: 1.45 mg/dL — ABNORMAL HIGH (ref 0.76–1.27)
GFR calc Af Amer: 57 mL/min/{1.73_m2} — ABNORMAL LOW (ref >59)
GFR calc non Af Amer: 49 mL/min/{1.73_m2} — ABNORMAL LOW (ref >59)
Globulin, Total: 2.9 g/dL (ref 1.5–4.5)
Glucose: 121 mg/dL — ABNORMAL HIGH (ref 65–99)
Potassium: 4.8 mmol/L (ref 3.5–5.2)
Sodium: 140 mmol/L (ref 134–144)
Total Protein: 7.2 g/dL (ref 6.0–8.5)

## 2019-08-24 LAB — BAYER DCA HB A1C WAIVED: HB A1C (BAYER DCA - WAIVED): 6.6 % (ref <7.0)

## 2019-08-24 NOTE — Progress Notes (Signed)
Virtual Visit via mychart video Note  I connected with Tony Hodges on 08/24/19 at [place] by video and verified that I am speaking with the correct person using two identifiers. Tony Hodges is currently located at home and no other people are currently with her during visit. The provider, Fransisca Kaufmann Ociel Retherford, MD is located in their office at time of visit.  Call ended at [phone]  I discussed the limitations, risks, security and privacy concerns of performing an evaluation and management service by video and the availability of in person appointments. I also discussed with the patient that there may be a patient responsible charge related to this service. The patient expressed understanding and agreed to proceed.  147/80, bs 121 History and Present Illness: Type 2 diabetes mellitus Patient comes in today for recheck of his diabetes. Patient has been currently taking actos and janumet. Patient is currently on an ACE inhibitor/ARB. Patient has seen an ophthalmologist this year. Patient denies any issues with their feet. The symptom started onset as an adult htn and hld ARE RELATED TO DM   Hypertension Patient is currently on amlodipine- benazepril, and their blood pressure today is 147/80. Patient denies any lightheadedness or dizziness. Patient denies headaches, blurred vision, chest pains, shortness of breath, or weakness. Denies any side effects from medication and is content with current medication.   Hyperlipidemia Patient is coming in for recheck of his hyperlipidemia. The patient is currently taking lipitor. They deny any issues with myalgias or history of liver damage from it. They deny any focal numbness or weakness or chest pain.   Numbness near right elbow, had imaging and nerve testing and did not find source, no motor or strength loss.   No diagnosis found.  Outpatient Encounter Medications as of 08/24/2019  Medication Sig  . amLODipine-benazepril (LOTREL) 5-10 MG capsule Take  1 capsule by mouth daily.  . Ascorbic Acid (VITAMIN C WITH ROSE HIPS) 500 MG tablet Take 500 mg by mouth daily.  Marland Kitchen aspirin (LONGS ADULT LOW STRENGTH ASA) 81 MG EC tablet Take 81 mg by mouth daily.    Marland Kitchen atorvastatin (LIPITOR) 20 MG tablet Take 0.5 tablets (10 mg total) by mouth at bedtime.  . cholecalciferol (VITAMIN D3) 25 MCG (1000 UNIT) tablet Take 2,000 Units by mouth daily.  . diclofenac sodium (VOLTAREN) 1 % GEL APPLY 2 GRAMS TO AFFECTED AREA 4 TIMES A DAY  . glucose blood (ONETOUCH ULTRA) test strip Check BS daily and as needed Dx E11.9  . Lancets (ONETOUCH ULTRASOFT) lancets Check blood sugar bid. Dx E11.9  . Multiple Vitamin (MULTIVITAMIN) capsule Take 1 capsule by mouth daily.  . pioglitazone (ACTOS) 30 MG tablet Take 1 tablet (30 mg total) by mouth daily.  . sitaGLIPtin-metformin (JANUMET) 50-1000 MG tablet Take 1 tablet by mouth 2 (two) times daily with a meal.  . traZODone (DESYREL) 50 MG tablet TAKE 0.5-1 TABLETS (25-50 MG TOTAL) BY MOUTH AT BEDTIME AS NEEDED FOR SLEEP.  Marland Kitchen zinc gluconate 50 MG tablet Take 50 mg by mouth daily.   Facility-Administered Encounter Medications as of 08/24/2019  Medication  . methylPREDNISolone acetate (DEPO-MEDROL) injection 40 mg    Review of Systems  Constitutional: Negative for chills and fever.  Eyes: Negative for visual disturbance.  Respiratory: Negative for shortness of breath and wheezing.   Cardiovascular: Negative for chest pain and leg swelling.  Musculoskeletal: Positive for arthralgias. Negative for back pain and gait problem.  Skin: Negative for rash.  All other systems reviewed  and are negative.   Observations/Objective: Patient sounds comfortable and in no acute distress  Assessment and Plan: Problem List Items Addressed This Visit      Cardiovascular and Mediastinum   HTN (hypertension) - Primary   Relevant Orders   CMP14+EGFR     Endocrine   Hyperlipidemia associated with type 2 diabetes mellitus ([place])   Diabetes  mellitus ([place])   Relevant Orders   Bayer DCA Hb A1c Waived    Other Visit Diagnoses    Trigger little finger of left hand          Will come in and do blood work, will do voltaren gel and future cortisone injection if needed Follow up plan: Return in about 3 months (around 11/24/2019), or if symptoms worsen or fail to improve, for diabetes and htn and hld.     I discussed the assessment and treatment plan with the patient. The patient was provided an opportunity to ask questions and all were answered. The patient agreed with the plan and demonstrated an understanding of the instructions.   The patient was advised to call back or seek an in-person evaluation if the symptoms worsen or if the condition fails to improve as anticipated.  The above assessment and management plan was discussed with the patient. The patient verbalized understanding of and has agreed to the management plan. Patient is aware to call the clinic if symptoms persist or worsen. Patient is aware when to return to the clinic for a follow-up visit. Patient educated on when it is appropriate to go to the emergency department.    I provided 13 minutes of non-face-to-face time during this encounter.    Worthy Rancher, MD

## 2019-10-25 ENCOUNTER — Telehealth: Payer: Self-pay

## 2019-10-25 NOTE — Telephone Encounter (Signed)
needs to wait 2 weeks

## 2019-10-31 ENCOUNTER — Other Ambulatory Visit: Payer: Self-pay

## 2019-10-31 ENCOUNTER — Ambulatory Visit (INDEPENDENT_AMBULATORY_CARE_PROVIDER_SITE_OTHER): Payer: Medicare Other

## 2019-10-31 DIAGNOSIS — Z23 Encounter for immunization: Secondary | ICD-10-CM

## 2019-10-31 NOTE — Progress Notes (Signed)
° °  Covid-19 Vaccination Clinic  Name:  Tony Hodges    MRN: [id number] DOB: 02-Nov-1950  10/31/2019  Tony Hodges was observed post Covid-19 immunization for 15 minutes without incident. He was provided with Vaccine Information Sheet and instruction to access the V-Safe system.   Tony Hodges was instructed to call 911 with any severe reactions post vaccine:  Difficulty breathing   Swelling of face and throat   A fast heartbeat   A bad rash all over body   Dizziness and weakness

## 2019-11-07 ENCOUNTER — Ambulatory Visit: Payer: Medicare Other

## 2019-11-08 ENCOUNTER — Other Ambulatory Visit: Payer: Self-pay

## 2019-11-08 ENCOUNTER — Ambulatory Visit (INDEPENDENT_AMBULATORY_CARE_PROVIDER_SITE_OTHER): Payer: Medicare Other

## 2019-11-08 DIAGNOSIS — Z23 Encounter for immunization: Secondary | ICD-10-CM | POA: Diagnosis not present

## 2019-11-26 ENCOUNTER — Other Ambulatory Visit: Payer: Self-pay

## 2019-11-26 ENCOUNTER — Ambulatory Visit (INDEPENDENT_AMBULATORY_CARE_PROVIDER_SITE_OTHER): Payer: Medicare Other | Admitting: Family Medicine

## 2019-11-26 ENCOUNTER — Encounter: Payer: Self-pay | Admitting: Family Medicine

## 2019-11-26 VITALS — BP 134/72 | HR 76 | Temp 97.4°F | Ht 71.0 in | Wt 188.5 lb

## 2019-11-26 DIAGNOSIS — E1169 Type 2 diabetes mellitus with other specified complication: Secondary | ICD-10-CM

## 2019-11-26 DIAGNOSIS — Z23 Encounter for immunization: Secondary | ICD-10-CM | POA: Diagnosis not present

## 2019-11-26 DIAGNOSIS — E0869 Diabetes mellitus due to underlying condition with other specified complication: Secondary | ICD-10-CM | POA: Diagnosis not present

## 2019-11-26 DIAGNOSIS — E785 Hyperlipidemia, unspecified: Secondary | ICD-10-CM | POA: Diagnosis not present

## 2019-11-26 DIAGNOSIS — I10 Essential (primary) hypertension: Secondary | ICD-10-CM | POA: Diagnosis not present

## 2019-11-26 MED ORDER — AMLODIPINE BESY-BENAZEPRIL HCL 5-10 MG PO CAPS: 1.0000 | ORAL_CAPSULE | Freq: Every day | ORAL | 3 refills | Status: DC

## 2019-11-26 MED ORDER — JANUMET 50-1000 MG PO TABS
1.0000 | ORAL_TABLET | Freq: Two times a day (BID) | ORAL | 3 refills | Status: DC
Start: 2019-11-26 — End: 2019-11-26

## 2019-11-26 MED ORDER — JANUMET 50-1000 MG PO TABS
1.0000 | ORAL_TABLET | Freq: Two times a day (BID) | ORAL | 3 refills | Status: DC
Start: 2019-11-26 — End: 2020-02-28

## 2019-11-26 MED ORDER — PIOGLITAZONE HCL 30 MG PO TABS
30.0000 mg | ORAL_TABLET | Freq: Every day | ORAL | 3 refills | Status: DC
Start: 2019-11-26 — End: 2020-10-14

## 2019-11-26 MED ORDER — ATORVASTATIN CALCIUM 20 MG PO TABS: 10.0000 mg | ORAL_TABLET | Freq: Every day | ORAL | 3 refills | Status: DC

## 2019-11-26 NOTE — Progress Notes (Signed)
BP 134/72   Pulse 76   Temp (!) 97.4 F (36.3 C)   Ht _0  (1.803 m)   Wt 188 lb 8 oz (85.5 kg)   SpO2 97%   BMI 26.29 kg/m    Subjective:   Patient ID: Tony Hodges, male    DOB: 1950/07/30, 69 y.o.   MRN: [id number]  HPI: Tony Hodges is a 69 y.o. male presenting on 11/26/2019 for Medical Management of Chronic Issues, Diabetes, Hyperlipidemia, and Hypertension   HPI Type 2 diabetes mellitus Patient comes in today for recheck of his diabetes. Patient has been currently taking Actos and Janumet, A1c 6.7. Patient is currently on an ACE inhibitor/ARB. Patient has seen an ophthalmologist this year. Patient denies any issues with their feet. The symptom started onset as an adult hypertension hyperlipidemia ARE RELATED TO DM   Hypertension Patient is currently on amlodipine-benazepril, and their blood pressure today is 134/72. Patient denies any lightheadedness or dizziness. Patient denies headaches, blurred vision, chest pains, shortness of breath, or weakness. Denies any side effects from medication and is content with current medication.   Hyperlipidemia Patient is coming in for recheck of his hyperlipidemia. The patient is currently taking atorvastatin. They deny any issues with myalgias or history of liver damage from it. They deny any focal numbness or weakness or chest pain.   Relevant past medical, surgical, family and social history reviewed and updated as indicated. Interim medical history since our last visit reviewed. Allergies and medications reviewed and updated.  Review of Systems  Constitutional: Negative for chills and fever.  Eyes: Negative for visual disturbance.  Respiratory: Negative for shortness of breath and wheezing.   Cardiovascular: Negative for chest pain and leg swelling.  Musculoskeletal: Negative for back pain and gait problem.  Skin: Negative for rash.  Neurological: Negative for dizziness, weakness and light-headedness.  All other systems  reviewed and are negative.   Per HPI unless specifically indicated above   Allergies as of 11/26/2019      Reactions   Other       Medication List       Accurate as of November 26, 2019  8:33 AM. If you have any questions, ask your nurse or doctor.        STOP taking these medications   traZODone 50 MG tablet Commonly known as: DESYREL Stopped by: Fransisca Kaufmann Manvir Prabhu, MD     TAKE these medications   amLODipine-benazepril 5-10 MG capsule Commonly known as: LOTREL Take 1 capsule by mouth daily.   atorvastatin 20 MG tablet Commonly known as: LIPITOR Take 0.5 tablets (10 mg total) by mouth at bedtime.   cholecalciferol 25 MCG (1000 UNIT) tablet Commonly known as: VITAMIN D3 Take 2,000 Units by mouth daily.   diclofenac sodium 1 % Gel Commonly known as: VOLTAREN APPLY 2 GRAMS TO AFFECTED AREA 4 TIMES A DAY   Janumet 50-1000 MG tablet Generic drug: sitaGLIPtin-metformin Take 1 tablet by mouth 2 (two) times daily with a meal.   Longs Adult Low Strength ASA 81 MG EC tablet Generic drug: aspirin Take 81 mg by mouth daily.   multivitamin capsule Take 1 capsule by mouth daily.   OneTouch Ultra test strip Generic drug: glucose blood Check BS daily and as needed Dx E11.9   onetouch ultrasoft lancets Check blood sugar bid. Dx E11.9   pioglitazone 30 MG tablet Commonly known as: ACTOS Take 1 tablet (30 mg total) by mouth daily.   vitamin C with rose hips  500 MG tablet Take 500 mg by mouth daily.   zinc gluconate 50 MG tablet Take 50 mg by mouth daily.        Objective:   BP 134/72   Pulse 76   Temp (!) 97.4 F (36.3 C)   Ht _0  (1.803 m)   Wt 188 lb 8 oz (85.5 kg)   SpO2 97%   BMI 26.29 kg/m   Wt Readings from Last 3 Encounters:  11/26/19 188 lb 8 oz (85.5 kg)  05/21/19 186 lb 8 oz (84.6 kg)  12/11/18 181 lb 12.8 oz (82.5 kg)    Physical Exam Vitals and nursing note reviewed.  Constitutional:      General: He is not in acute distress.     Appearance: He is well-developed. He is not diaphoretic.  Eyes:     General: No scleral icterus.    Conjunctiva/sclera: Conjunctivae normal.  Neck:     Thyroid: No thyromegaly.  Cardiovascular:     Rate and Rhythm: Normal rate and regular rhythm.     Heart sounds: Normal heart sounds. No murmur heard.   Pulmonary:     Effort: Pulmonary effort is normal. No respiratory distress.     Breath sounds: Normal breath sounds. No wheezing.  Musculoskeletal:        General: Normal range of motion.     Cervical back: Neck supple.  Lymphadenopathy:     Cervical: No cervical adenopathy.  Skin:    General: Skin is warm and dry.     Findings: No rash.  Neurological:     Mental Status: He is alert and oriented to person, place, and time.     Coordination: Coordination normal.  Psychiatric:        Behavior: Behavior normal.       Assessment & Plan:   Problem List Items Addressed This Visit      Cardiovascular and Mediastinum   HTN (hypertension)   Relevant Medications   amLODipine-benazepril (LOTREL) 5-10 MG capsule   atorvastatin (LIPITOR) 20 MG tablet   Other Relevant Orders   CMP14+EGFR     Endocrine   Hyperlipidemia associated with type 2 diabetes mellitus (HCC)   Relevant Medications   amLODipine-benazepril (LOTREL) 5-10 MG capsule   atorvastatin (LIPITOR) 20 MG tablet   pioglitazone (ACTOS) 30 MG tablet   sitaGLIPtin-metformin (JANUMET) 50-1000 MG tablet   Other Relevant Orders   Lipid panel   Diabetes mellitus ([place]) - Primary   Relevant Medications   amLODipine-benazepril (LOTREL) 5-10 MG capsule   atorvastatin (LIPITOR) 20 MG tablet   pioglitazone (ACTOS) 30 MG tablet   sitaGLIPtin-metformin (JANUMET) 50-1000 MG tablet   Other Relevant Orders   Bayer DCA Hb A1c Waived   CBC with Differential/Platelet    Other Visit Diagnoses    Need for shingles vaccine       Relevant Orders   Varicella-zoster vaccine IM (Shingrix) (Completed)      Patient does have a left  fifth finger trigger finger that he wants an injection for but since he is getting a vaccine today we will hold off on that until a few weeks. Follow up plan: Return in about 3 months (around 02/26/2020), or if symptoms worsen or fail to improve, for Diabetes and hypertension and cholesterol.  Counseling provided for all of the vaccine components Orders Placed This Encounter  Procedures  . [facility] Hb A1c Waived    Caryl Pina, MD [facility] 11/26/2019, 8:33 AM

## 2019-11-27 LAB — CBC WITH DIFFERENTIAL/PLATELET
Basophils Absolute: 0 10*3/uL (ref 0.0–0.2)
Basos: 1 %
EOS (ABSOLUTE): 0.1 10*3/uL (ref 0.0–0.4)
Eos: 2 %
Hematocrit: 42 % (ref 37.5–51.0)
Hemoglobin: 13.9 g/dL (ref 13.0–17.7)
Immature Grans (Abs): 0 10*3/uL (ref 0.0–0.1)
Immature Granulocytes: 0 %
Lymphocytes Absolute: 1.7 10*3/uL (ref 0.7–3.1)
Lymphs: 35 %
MCH: 30 pg (ref 26.6–33.0)
MCHC: 33.1 g/dL (ref 31.5–35.7)
MCV: 91 fL (ref 79–97)
Monocytes Absolute: 0.5 10*3/uL (ref 0.1–0.9)
Monocytes: 11 %
Neutrophils Absolute: 2.4 10*3/uL (ref 1.4–7.0)
Neutrophils: 51 %
Platelets: 231 10*3/uL (ref 150–450)
RBC: 4.64 x10E6/uL (ref 4.14–5.80)
RDW: 12.6 % (ref 11.6–15.4)
WBC: 4.8 10*3/uL (ref 3.4–10.8)

## 2019-11-27 LAB — CMP14+EGFR
ALT: 12 IU/L (ref 0–44)
AST: 17 IU/L (ref 0–40)
Albumin/Globulin Ratio: 1.7 (ref 1.2–2.2)
Albumin: 4.2 g/dL (ref 3.8–4.8)
Alkaline Phosphatase: 60 IU/L (ref 44–121)
BUN/Creatinine Ratio: 18 (ref 10–24)
BUN: 22 mg/dL (ref 8–27)
Bilirubin Total: 0.4 mg/dL (ref 0.0–1.2)
CO2: 25 mmol/L (ref 20–29)
Calcium: 9.4 mg/dL (ref 8.6–10.2)
Chloride: 102 mmol/L (ref 96–106)
Creatinine, Ser: 1.25 mg/dL (ref 0.76–1.27)
GFR calc Af Amer: 68 mL/min/{1.73_m2} (ref >59)
GFR calc non Af Amer: 59 mL/min/{1.73_m2} — ABNORMAL LOW (ref >59)
Globulin, Total: 2.5 g/dL (ref 1.5–4.5)
Glucose: 134 mg/dL — ABNORMAL HIGH (ref 65–99)
Potassium: 4.5 mmol/L (ref 3.5–5.2)
Sodium: 138 mmol/L (ref 134–144)
Total Protein: 6.7 g/dL (ref 6.0–8.5)

## 2019-11-27 LAB — LIPID PANEL
Chol/HDL Ratio: 2.8 ratio (ref 0.0–5.0)
Cholesterol, Total: 144 mg/dL (ref 100–199)
HDL: 52 mg/dL (ref >39)
LDL Chol Calc (NIH): 77 mg/dL (ref 0–99)
Triglycerides: 77 mg/dL (ref 0–149)
VLDL Cholesterol Cal: 15 mg/dL (ref 5–40)

## 2019-11-27 LAB — BAYER DCA HB A1C WAIVED: HB A1C (BAYER DCA - WAIVED): 6.7 % (ref <7.0)

## 2019-12-13 ENCOUNTER — Encounter: Payer: Self-pay | Admitting: Family Medicine

## 2019-12-13 ENCOUNTER — Ambulatory Visit (INDEPENDENT_AMBULATORY_CARE_PROVIDER_SITE_OTHER): Payer: Medicare Other | Admitting: Family Medicine

## 2019-12-13 ENCOUNTER — Other Ambulatory Visit: Payer: Self-pay

## 2019-12-13 VITALS — BP 108/61 | HR 74 | Ht 71.0 in | Wt 194.0 lb

## 2019-12-13 DIAGNOSIS — M65352 Trigger finger, left little finger: Secondary | ICD-10-CM | POA: Diagnosis not present

## 2019-12-13 MED ORDER — METHYLPREDNISOLONE ACETATE 40 MG/ML IJ SUSP: 40.0000 mg | Freq: Once | INTRAMUSCULAR | Status: DC

## 2019-12-13 NOTE — Progress Notes (Signed)
   BP 108/61   Pulse 74   Ht 5\' 11"  (1.803 m)   Wt 194 lb (88 kg)   SpO2 97%   BMI 27.06 kg/m    Subjective:   Patient ID: Tony Hodges, male    DOB: 06/29/1950, 69 y.o.   MRN: [id number]  HPI: Tony Hodges is a 69 y.o. male presenting on 12/13/2019 for Trigger finger (left 5th finger)   HPI Patient is coming in today complaining of left fifth finger trigger finger that is been bothering him more over the past couple months.  He says when he closes his fist that it will really catch and he has to really pull it to get it to pop out and it is painful.  He has had trigger fingers in other fingers before but not this 1.  He denies any fevers or chills or redness or warmth.  He denies any loss of grip or strength or numbness.  Relevant past medical, surgical, family and social history reviewed and updated as indicated. Interim medical history since our last visit reviewed. Allergies and medications reviewed and updated.  Review of Systems  Constitutional: Negative for chills and fever.  Musculoskeletal: Positive for arthralgias.  Skin: Negative for color change and rash.    Per HPI unless specifically indicated above      Objective:   BP 108/61   Pulse 74   Ht 5\' 11"  (1.803 m)   Wt 194 lb (88 kg)   SpO2 97%   BMI 27.06 kg/m   Wt Readings from Last 3 Encounters:  12/13/19 194 lb (88 kg)  11/26/19 188 lb 8 oz (85.5 kg)  05/21/19 186 lb 8 oz (84.6 kg)    Physical Exam Vitals and nursing note reviewed.  Musculoskeletal:     Left hand: No bony tenderness. Normal strength. Normal sensation.     Comments: Trigger finger on left fifth finger, able to feel it pop back and forth.  Skin:    General: Skin is warm and dry.     Findings: No erythema or lesion.     Left fifth trigger finger injection: Consent form signed. Risk factors of bleeding and infection discussed with patient and patient is agreeable towards injection. Patient prepped with Betadine.  Anterior  approach towards injection used. Injected 40 mg of Depo-Medrol and 1 mL of 2% lidocaine. Patient tolerated procedure well and no side effects from noted. Minimal to no bleeding. Simple bandage applied after.   Assessment & Plan:   Problem List Items Addressed This Visit   None   Visit Diagnoses    Trigger little finger of left hand    -  Primary   Relevant Medications   methylPREDNISolone acetate (DEPO-MEDROL) injection 40 mg       Follow up plan: Return if symptoms worsen or fail to improve.  Counseling provided for all of the vaccine components No orders of the defined types were placed in this encounter.   Caryl Pina, MD [facility] 12/13/2019, 4:37 PM

## 2020-02-15 ENCOUNTER — Other Ambulatory Visit: Payer: Self-pay | Admitting: Family Medicine

## 2020-02-28 ENCOUNTER — Other Ambulatory Visit: Payer: Self-pay

## 2020-02-28 ENCOUNTER — Encounter: Payer: Self-pay | Admitting: Family Medicine

## 2020-02-28 ENCOUNTER — Ambulatory Visit: Payer: Medicare PPO | Admitting: Family Medicine

## 2020-02-28 VITALS — BP 128/71 | HR 70 | Temp 97.4°F | Ht 71.0 in | Wt 195.6 lb

## 2020-02-28 DIAGNOSIS — E0869 Diabetes mellitus due to underlying condition with other specified complication: Secondary | ICD-10-CM | POA: Diagnosis not present

## 2020-02-28 DIAGNOSIS — E785 Hyperlipidemia, unspecified: Secondary | ICD-10-CM | POA: Diagnosis not present

## 2020-02-28 DIAGNOSIS — I10 Essential (primary) hypertension: Secondary | ICD-10-CM | POA: Diagnosis not present

## 2020-02-28 DIAGNOSIS — E1169 Type 2 diabetes mellitus with other specified complication: Secondary | ICD-10-CM

## 2020-02-28 LAB — BAYER DCA HB A1C WAIVED: HB A1C (BAYER DCA - WAIVED): 7.1 % — ABNORMAL HIGH (ref <7.0)

## 2020-02-28 MED ORDER — ACCU-CHEK GUIDE W/DEVICE KIT: 1.0000 | PACK | Freq: Two times a day (BID) | 3 refills | Status: AC | PRN

## 2020-02-28 MED ORDER — JANUMET 50-1000 MG PO TABS: 1.0000 | ORAL_TABLET | Freq: Two times a day (BID) | ORAL | 3 refills | Status: DC

## 2020-02-28 NOTE — Progress Notes (Signed)
BP 128/71   Pulse 70   Temp (!) 97.4 F (36.3 C)   Ht _0  (1.803 m)   Wt 195 lb 9.6 oz (88.7 kg)   SpO2 98%   BMI 27.28 kg/m    Subjective:   Patient ID: Tony Hodges, male    DOB: 1950/08/01, 70 y.o.   MRN: [id number]  HPI: Tony Hodges is a 70 y.o. male presenting on 02/28/2020 for Medical Management of Chronic Issues   HPI Type 2 diabetes mellitus Patient comes in today for recheck of his diabetes. Patient has been currently taking Janumet and Actos. Patient is currently on an ACE inhibitor/ARB. Patient has not seen an ophthalmologist this year. Patient denies any issues with their feet. The symptom started onset as an adult hypertension and hyperlipidemia ARE RELATED TO DM   Hypertension Patient is currently on amlodipine-benazepril, and their blood pressure today is 128/71. Patient denies any lightheadedness or dizziness. Patient denies headaches, blurred vision, chest pains, shortness of breath, or weakness. Denies any side effects from medication and is content with current medication.   Hyperlipidemia Patient is coming in for recheck of his hyperlipidemia. The patient is currently taking Lipitor. They deny any issues with myalgias or history of liver damage from it. They deny any focal numbness or weakness or chest pain.   Relevant past medical, surgical, family and social history reviewed and updated as indicated. Interim medical history since our last visit reviewed. Allergies and medications reviewed and updated.  Review of Systems  Constitutional: Negative for chills and fever.  Eyes: Negative for visual disturbance.  Respiratory: Negative for shortness of breath and wheezing.   Cardiovascular: Negative for chest pain and leg swelling.  Musculoskeletal: Negative for back pain and gait problem.  Skin: Negative for rash.  Neurological: Negative for dizziness, weakness and light-headedness.  All other systems reviewed and are negative.   Per HPI unless  specifically indicated above   Allergies as of 02/28/2020      Reactions   Other       Medication List       Accurate as of February 28, 2020  8:54 AM. If you have any questions, ask your nurse or doctor.        amLODipine-benazepril 5-10 MG capsule Commonly known as: LOTREL Take 1 capsule by mouth daily.   aspirin 81 MG EC tablet Take 81 mg by mouth daily.   atorvastatin 20 MG tablet Commonly known as: LIPITOR Take 0.5 tablets (10 mg total) by mouth at bedtime.   cholecalciferol 25 MCG (1000 UNIT) tablet Commonly known as: VITAMIN D3 Take 2,000 Units by mouth daily.   diclofenac sodium 1 % Gel Commonly known as: VOLTAREN APPLY 2 GRAMS TO AFFECTED AREA 4 TIMES A DAY   Janumet 50-1000 MG tablet Generic drug: sitaGLIPtin-metformin Take 1 tablet by mouth 2 (two) times daily with a meal.   multivitamin capsule Take 1 capsule by mouth daily.   OneTouch Ultra test strip Generic drug: glucose blood USE TO CHECK BLOOD SUGAR DAILY AND AS NEEDED   onetouch ultrasoft lancets Check blood sugar bid. Dx E11.9   pioglitazone 30 MG tablet Commonly known as: ACTOS Take 1 tablet (30 mg total) by mouth daily.   vitamin C with rose hips 500 MG tablet Take 500 mg by mouth daily.   zinc gluconate 50 MG tablet Take 50 mg by mouth daily.        Objective:   BP 128/71   Pulse 70  Temp (!) 97.4 F (36.3 C)   Ht _0  (1.803 m)   Wt 195 lb 9.6 oz (88.7 kg)   SpO2 98%   BMI 27.28 kg/m   Wt Readings from Last 3 Encounters:  02/28/20 195 lb 9.6 oz (88.7 kg)  12/13/19 194 lb (88 kg)  11/26/19 188 lb 8 oz (85.5 kg)    Physical Exam Vitals and nursing note reviewed.  Constitutional:      General: He is not in acute distress.    Appearance: He is well-developed and well-nourished. He is not diaphoretic.  Eyes:     General: No scleral icterus.    Extraocular Movements: EOM normal.     Conjunctiva/sclera: Conjunctivae normal.  Neck:     Thyroid: No thyromegaly.   Cardiovascular:     Rate and Rhythm: Normal rate and regular rhythm.     Pulses: Intact distal pulses.     Heart sounds: Normal heart sounds. No murmur heard.   Pulmonary:     Effort: Pulmonary effort is normal. No respiratory distress.     Breath sounds: Normal breath sounds. No wheezing.  Musculoskeletal:        General: No edema. Normal range of motion.     Cervical back: Neck supple.  Lymphadenopathy:     Cervical: No cervical adenopathy.  Skin:    General: Skin is warm and dry.     Findings: No rash.  Neurological:     Mental Status: He is alert and oriented to person, place, and time.     Coordination: Coordination normal.  Psychiatric:        Mood and Affect: Mood and affect normal.        Behavior: Behavior normal.       Assessment & Plan:   Problem List Items Addressed This Visit      Cardiovascular and Mediastinum   HTN (hypertension)     Endocrine   Hyperlipidemia associated with type 2 diabetes mellitus (HCC)   Relevant Medications   sitaGLIPtin-metformin (JANUMET) 50-1000 MG tablet   Diabetes mellitus (HCC) - Primary   Relevant Medications   sitaGLIPtin-metformin (JANUMET) 50-1000 MG tablet   Blood Glucose Monitoring Suppl (ACCU-CHEK GUIDE) w/Device KIT   Other Relevant Orders   [facility]ayer DCA Hb A1c Waived      Current medication, A1c is slightly up at 7.1.  Focus on diet and exercise and will recheck in 3 months Follow up plan: Return in about 3 months (around 05/27/2020), or if symptoms worsen or fail to improve, for Diabetes and hypertension and cholesterol.  Counseling provided for all of the vaccine components Orders Placed This Encounter  Procedures  . [facility] Hb A1c [place], MD [facility] 02/28/2020, 8:54 AM

## 2020-03-05 ENCOUNTER — Other Ambulatory Visit: Payer: Self-pay | Admitting: *Deleted

## 2020-03-05 MED ORDER — ONETOUCH ULTRA VI STRP: ORAL_STRIP | 4 refills | Status: DC

## 2020-05-29 ENCOUNTER — Encounter: Payer: Self-pay | Admitting: Family Medicine

## 2020-05-29 ENCOUNTER — Other Ambulatory Visit: Payer: Self-pay

## 2020-05-29 ENCOUNTER — Ambulatory Visit: Payer: Medicare PPO | Admitting: Family Medicine

## 2020-05-29 VITALS — BP 103/60 | HR 69 | Ht 71.0 in | Wt 193.0 lb

## 2020-05-29 DIAGNOSIS — E785 Hyperlipidemia, unspecified: Secondary | ICD-10-CM

## 2020-05-29 DIAGNOSIS — I10 Essential (primary) hypertension: Secondary | ICD-10-CM | POA: Diagnosis not present

## 2020-05-29 DIAGNOSIS — E1169 Type 2 diabetes mellitus with other specified complication: Secondary | ICD-10-CM

## 2020-05-29 DIAGNOSIS — E0869 Diabetes mellitus due to underlying condition with other specified complication: Secondary | ICD-10-CM | POA: Diagnosis not present

## 2020-05-29 DIAGNOSIS — M7632 Iliotibial band syndrome, left leg: Secondary | ICD-10-CM | POA: Diagnosis not present

## 2020-05-29 LAB — CBC WITH DIFFERENTIAL/PLATELET
Basophils Absolute: 0.1 10*3/uL (ref 0.0–0.2)
Basos: 1 %
EOS (ABSOLUTE): 0.1 10*3/uL (ref 0.0–0.4)
Eos: 2 %
Hematocrit: 40.1 % (ref 37.5–51.0)
Hemoglobin: 13.1 g/dL (ref 13.0–17.7)
Immature Grans (Abs): 0 10*3/uL (ref 0.0–0.1)
Immature Granulocytes: 0 %
Lymphocytes Absolute: 2 10*3/uL (ref 0.7–3.1)
Lymphs: 32 %
MCH: 29.8 pg (ref 26.6–33.0)
MCHC: 32.7 g/dL (ref 31.5–35.7)
MCV: 91 fL (ref 79–97)
Monocytes Absolute: 0.7 10*3/uL (ref 0.1–0.9)
Monocytes: 11 %
Neutrophils Absolute: 3.3 10*3/uL (ref 1.4–7.0)
Neutrophils: 54 %
Platelets: 252 10*3/uL (ref 150–450)
RBC: 4.4 x10E6/uL (ref 4.14–5.80)
RDW: 13 % (ref 11.6–15.4)
WBC: 6.1 10*3/uL (ref 3.4–10.8)

## 2020-05-29 LAB — BAYER DCA HB A1C WAIVED: HB A1C (BAYER DCA - WAIVED): 6.9 % (ref <7.0)

## 2020-05-29 LAB — CMP14+EGFR
ALT: 16 IU/L (ref 0–44)
AST: 19 IU/L (ref 0–40)
Albumin/Globulin Ratio: 1.7 (ref 1.2–2.2)
Albumin: 4.5 g/dL (ref 3.8–4.8)
Alkaline Phosphatase: 60 IU/L (ref 44–121)
BUN/Creatinine Ratio: 16 (ref 10–24)
BUN: 25 mg/dL (ref 8–27)
Bilirubin Total: 0.4 mg/dL (ref 0.0–1.2)
CO2: 22 mmol/L (ref 20–29)
Calcium: 9.3 mg/dL (ref 8.6–10.2)
Chloride: 101 mmol/L (ref 96–106)
Creatinine, Ser: 1.52 mg/dL — ABNORMAL HIGH (ref 0.76–1.27)
Globulin, Total: 2.7 g/dL (ref 1.5–4.5)
Glucose: 129 mg/dL — ABNORMAL HIGH (ref 65–99)
Potassium: 4.9 mmol/L (ref 3.5–5.2)
Sodium: 138 mmol/L (ref 134–144)
Total Protein: 7.2 g/dL (ref 6.0–8.5)
eGFR: 49 mL/min/{1.73_m2} — ABNORMAL LOW (ref >59)

## 2020-05-29 LAB — LIPID PANEL
Chol/HDL Ratio: 2.7 ratio (ref 0.0–5.0)
Cholesterol, Total: 132 mg/dL (ref 100–199)
HDL: 49 mg/dL (ref >39)
LDL Chol Calc (NIH): 69 mg/dL (ref 0–99)
Triglycerides: 71 mg/dL (ref 0–149)
VLDL Cholesterol Cal: 14 mg/dL (ref 5–40)

## 2020-05-29 NOTE — Progress Notes (Signed)
BP 103/60   Pulse 69   Ht '5\' 11"'  (1.803 m)   Wt 193 lb (87.5 kg)   SpO2 99%   BMI 26.92 kg/m    Subjective:   Patient ID: Tony Hodges, male    DOB: 1950-11-05, 70 y.o.   MRN: [id number]  HPI: Tony Hodges is a 70 y.o. male presenting on 05/29/2020 for Medical Management of Chronic Issues, Diabetes, and Hyperlipidemia   HPI Type 2 diabetes mellitus Patient comes in today for recheck of his diabetes. Patient has been currently taking Actos and Janumet. Patient is currently on an ACE inhibitor/ARB. Patient has not seen an ophthalmologist this year. Patient denies any issues with their feet. The symptom started onset as an adult hypertension and hyperlipidemia ARE RELATED TO DM   Hypertension Patient is currently on amlodipine-benazepril, and their blood pressure today is 103/60. Patient denies any lightheadedness or dizziness. Patient denies headaches, blurred vision, chest pains, shortness of breath, or weakness. Denies any side effects from medication and is content with current medication.   Hyperlipidemia Patient is coming in for recheck of his hyperlipidemia. The patient is currently taking atorvastatin. They deny any issues with myalgias or history of liver damage from it. They deny any focal numbness or weakness or chest pain.   Patient has been having more problems with left lateral hip, he has been doing some stretches and exercises and it does hurt on the lateral side.  He does like to go play golf and walk the golf course and afterwards been getting sore on him.  He plans to go see a chiropractor, has been using Voltaren gel and a TENS unit which have helped, its been persisting for about 3 months.  Relevant past medical, surgical, family and social history reviewed and updated as indicated. Interim medical history since our last visit reviewed. Allergies and medications reviewed and updated.  Review of Systems  Constitutional: Negative for chills and fever.  Eyes:  Negative for discharge.  Respiratory: Negative for shortness of breath and wheezing.   Cardiovascular: Negative for chest pain and leg swelling.  Musculoskeletal: Negative for back pain and gait problem.  Skin: Negative for rash.  Neurological: Negative for dizziness, weakness and light-headedness.  All other systems reviewed and are negative.   Per HPI unless specifically indicated above   Allergies as of 05/29/2020      Reactions   Other       Medication List       Accurate as of May 29, 2020  8:48 AM. If you have any questions, ask your nurse or doctor.        Accu-Chek Guide w/Device Kit 1 each by Does not apply route 2 (two) times daily as needed.   amLODipine-benazepril 5-10 MG capsule Commonly known as: LOTREL Take 1 capsule by mouth daily.   aspirin 81 MG EC tablet Take 81 mg by mouth daily.   atorvastatin 20 MG tablet Commonly known as: LIPITOR Take 0.5 tablets (10 mg total) by mouth at bedtime.   cholecalciferol 25 MCG (1000 UNIT) tablet Commonly known as: VITAMIN D3 Take 2,000 Units by mouth daily.   diclofenac sodium 1 % Gel Commonly known as: VOLTAREN APPLY 2 GRAMS TO AFFECTED AREA 4 TIMES A DAY   Janumet 50-1000 MG tablet Generic drug: sitaGLIPtin-metformin Take 1 tablet by mouth 2 (two) times daily with a meal.   multivitamin capsule Take 1 capsule by mouth daily.   OneTouch Ultra test strip Generic drug: glucose blood  CHECK BLOOD SUGAR DAILY AND AS NEEDED Dx E11.9   onetouch ultrasoft lancets Check blood sugar bid. Dx E11.9   pioglitazone 30 MG tablet Commonly known as: ACTOS Take 1 tablet (30 mg total) by mouth daily.   vitamin C with rose hips 500 MG tablet Take 500 mg by mouth daily.   zinc gluconate 50 MG tablet Take 50 mg by mouth daily.        Objective:   BP 103/60   Pulse 69   Ht '5\' 11"'  (1.803 m)   Wt 193 lb (87.5 kg)   SpO2 99%   BMI 26.92 kg/m   Wt Readings from Last 3 Encounters:  05/29/20 193 lb (87.5 kg)   02/28/20 195 lb 9.6 oz (88.7 kg)  12/13/19 194 lb (88 kg)    Physical Exam Vitals and nursing note reviewed.  Constitutional:      General: He is not in acute distress.    Appearance: He is well-developed. He is not diaphoretic.  Eyes:     General: No scleral icterus.    Conjunctiva/sclera: Conjunctivae normal.  Neck:     Thyroid: No thyromegaly.  Cardiovascular:     Rate and Rhythm: Normal rate and regular rhythm.     Heart sounds: Normal heart sounds. No murmur heard.   Pulmonary:     Effort: Pulmonary effort is normal. No respiratory distress.     Breath sounds: Normal breath sounds. No wheezing.  Musculoskeletal:        General: Tenderness (Lateral tenderness on left hip proximal) present. Normal range of motion.     Cervical back: Neck supple.  Lymphadenopathy:     Cervical: No cervical adenopathy.  Skin:    General: Skin is warm and dry.     Findings: No rash.  Neurological:     Mental Status: He is alert and oriented to person, place, and time.     Coordination: Coordination normal.  Psychiatric:        Behavior: Behavior normal.       Assessment & Plan:   Problem List Items Addressed This Visit      Cardiovascular and Mediastinum   HTN (hypertension)   Relevant Orders   CBC with Differential/Platelet   CMP14+EGFR   Lipid panel   Bayer DCA Hb A1c Waived     Endocrine   Hyperlipidemia associated with type 2 diabetes mellitus ([place])   Relevant Orders   CBC with Differential/Platelet   CMP14+EGFR   Lipid panel   Bayer DCA Hb A1c Waived   Diabetes mellitus ([place]) - Primary   Relevant Orders   CBC with Differential/Platelet   CMP14+EGFR   Lipid panel   Bayer DCA Hb A1c Waived    Other Visit Diagnoses    It band syndrome, left          Continue current medication, no changes, recommend stretching exercises and anti-inflammatories for his left hip and strengthening. Follow up plan: Return in about 6 months (around 11/29/2020), or if symptoms  worsen or fail to improve, for Physical exam and diabetes.  Counseling provided for all of the vaccine components Orders Placed This Encounter  Procedures  . CBC with Differential/Platelet  . CMP14+EGFR  . Lipid panel  . [facility] Hb A1c [place], MD [facility] 05/29/2020, 8:48 AM

## 2020-06-05 DIAGNOSIS — S233XXA Sprain of ligaments of thoracic spine, initial encounter: Secondary | ICD-10-CM | POA: Diagnosis not present

## 2020-06-05 DIAGNOSIS — M9902 Segmental and somatic dysfunction of thoracic region: Secondary | ICD-10-CM | POA: Diagnosis not present

## 2020-06-05 DIAGNOSIS — S338XXA Sprain of other parts of lumbar spine and pelvis, initial encounter: Secondary | ICD-10-CM | POA: Diagnosis not present

## 2020-06-05 DIAGNOSIS — M9903 Segmental and somatic dysfunction of lumbar region: Secondary | ICD-10-CM | POA: Diagnosis not present

## 2020-06-05 DIAGNOSIS — S134XXA Sprain of ligaments of cervical spine, initial encounter: Secondary | ICD-10-CM | POA: Diagnosis not present

## 2020-06-05 DIAGNOSIS — M9901 Segmental and somatic dysfunction of cervical region: Secondary | ICD-10-CM | POA: Diagnosis not present

## 2020-06-12 DIAGNOSIS — M9901 Segmental and somatic dysfunction of cervical region: Secondary | ICD-10-CM | POA: Diagnosis not present

## 2020-06-12 DIAGNOSIS — S233XXA Sprain of ligaments of thoracic spine, initial encounter: Secondary | ICD-10-CM | POA: Diagnosis not present

## 2020-06-12 DIAGNOSIS — S338XXA Sprain of other parts of lumbar spine and pelvis, initial encounter: Secondary | ICD-10-CM | POA: Diagnosis not present

## 2020-06-12 DIAGNOSIS — S134XXA Sprain of ligaments of cervical spine, initial encounter: Secondary | ICD-10-CM | POA: Diagnosis not present

## 2020-06-12 DIAGNOSIS — M9903 Segmental and somatic dysfunction of lumbar region: Secondary | ICD-10-CM | POA: Diagnosis not present

## 2020-06-12 DIAGNOSIS — M9902 Segmental and somatic dysfunction of thoracic region: Secondary | ICD-10-CM | POA: Diagnosis not present

## 2020-07-03 DIAGNOSIS — E119 Type 2 diabetes mellitus without complications: Secondary | ICD-10-CM | POA: Diagnosis not present

## 2020-07-03 DIAGNOSIS — S233XXA Sprain of ligaments of thoracic spine, initial encounter: Secondary | ICD-10-CM | POA: Diagnosis not present

## 2020-07-03 DIAGNOSIS — M9903 Segmental and somatic dysfunction of lumbar region: Secondary | ICD-10-CM | POA: Diagnosis not present

## 2020-07-03 DIAGNOSIS — S338XXA Sprain of other parts of lumbar spine and pelvis, initial encounter: Secondary | ICD-10-CM | POA: Diagnosis not present

## 2020-07-03 DIAGNOSIS — M9901 Segmental and somatic dysfunction of cervical region: Secondary | ICD-10-CM | POA: Diagnosis not present

## 2020-07-03 DIAGNOSIS — S134XXA Sprain of ligaments of cervical spine, initial encounter: Secondary | ICD-10-CM | POA: Diagnosis not present

## 2020-07-03 DIAGNOSIS — M9902 Segmental and somatic dysfunction of thoracic region: Secondary | ICD-10-CM | POA: Diagnosis not present

## 2020-07-03 LAB — HM DIABETES EYE EXAM

## 2020-07-24 DIAGNOSIS — S134XXA Sprain of ligaments of cervical spine, initial encounter: Secondary | ICD-10-CM | POA: Diagnosis not present

## 2020-07-24 DIAGNOSIS — M9902 Segmental and somatic dysfunction of thoracic region: Secondary | ICD-10-CM | POA: Diagnosis not present

## 2020-07-24 DIAGNOSIS — M9901 Segmental and somatic dysfunction of cervical region: Secondary | ICD-10-CM | POA: Diagnosis not present

## 2020-07-24 DIAGNOSIS — S233XXA Sprain of ligaments of thoracic spine, initial encounter: Secondary | ICD-10-CM | POA: Diagnosis not present

## 2020-07-24 DIAGNOSIS — S338XXA Sprain of other parts of lumbar spine and pelvis, initial encounter: Secondary | ICD-10-CM | POA: Diagnosis not present

## 2020-07-24 DIAGNOSIS — M9903 Segmental and somatic dysfunction of lumbar region: Secondary | ICD-10-CM | POA: Diagnosis not present

## 2020-08-21 DIAGNOSIS — S134XXA Sprain of ligaments of cervical spine, initial encounter: Secondary | ICD-10-CM | POA: Diagnosis not present

## 2020-08-21 DIAGNOSIS — S233XXA Sprain of ligaments of thoracic spine, initial encounter: Secondary | ICD-10-CM | POA: Diagnosis not present

## 2020-08-21 DIAGNOSIS — M9902 Segmental and somatic dysfunction of thoracic region: Secondary | ICD-10-CM | POA: Diagnosis not present

## 2020-08-21 DIAGNOSIS — M9901 Segmental and somatic dysfunction of cervical region: Secondary | ICD-10-CM | POA: Diagnosis not present

## 2020-08-21 DIAGNOSIS — M9903 Segmental and somatic dysfunction of lumbar region: Secondary | ICD-10-CM | POA: Diagnosis not present

## 2020-08-21 DIAGNOSIS — S338XXA Sprain of other parts of lumbar spine and pelvis, initial encounter: Secondary | ICD-10-CM | POA: Diagnosis not present

## 2020-10-14 ENCOUNTER — Other Ambulatory Visit: Payer: Self-pay | Admitting: Family Medicine

## 2020-11-18 ENCOUNTER — Encounter: Payer: Self-pay | Admitting: Nurse Practitioner

## 2020-11-18 ENCOUNTER — Ambulatory Visit: Payer: Medicare PPO | Admitting: Nurse Practitioner

## 2020-11-18 DIAGNOSIS — J069 Acute upper respiratory infection, unspecified: Secondary | ICD-10-CM | POA: Diagnosis not present

## 2020-11-18 MED ORDER — AZITHROMYCIN 250 MG PO TABS: ORAL_TABLET | ORAL | 0 refills | Status: DC

## 2020-11-18 MED ORDER — PREDNISONE 20 MG PO TABS: 40.0000 mg | ORAL_TABLET | Freq: Every day | ORAL | 0 refills | Status: AC

## 2020-11-18 NOTE — Progress Notes (Addendum)
Virtual Visit via video Note   Due to COVID-19 pandemic this visit was conducted virtually. This visit type was conducted due to national recommendations for restrictions regarding the COVID-19 Pandemic (e.g. social distancing, sheltering in place) in an effort to limit this patient's exposure and mitigate transmission in our community. All issues noted in this document were discussed and addressed.  A physical exam was not performed with this format.  I connected with  Tony Hodges  on 11/18/20 at 10:15 by video and verified that I am speaking with the correct person using two identifiers. Tony Hodges is currently located at home and his wife is currently with him during visit. The provider, Mary-Margaret Hassell Done, FNP is located in their office at time of visit.  I discussed the limitations, risks, security and privacy concerns of performing an evaluation and management service by video  and the availability of in person appointments. I also discussed with the patient that there may be a patient responsible charge related to this service. The patient expressed understanding and agreed to proceed.   History and Present Illness:  Patient developed headcahe, congestion and sore throat on Wednesday of last week.  He has had no fever.    Review of Systems  Constitutional:  Positive for malaise/fatigue. Negative for chills and fever.  HENT:  Positive for congestion and sore throat.   Respiratory:  Positive for cough and sputum production.   Musculoskeletal:  Negative for myalgias.  Neurological:  Positive for headaches.      Observations/Objective: Alert and oriented- answers all questions appropriately No distress Slight wet cough noted during visit  Assessment and Plan: Tony Hodges in today with chief complaint of No chief complaint on file.   1. URI with cough and congestion 1. Take meds as prescribed 2. Use a cool mist humidifier especially during the winter months  and when heat has been humid. 3. Use saline nose sprays frequently 4. Saline irrigations of the nose can be very helpful if done frequently.  * 4X daily for 1 week*  * Use of a nettie pot can be helpful with this. Follow directions with this* 5. Drink plenty of fluids 6. Keep thermostat turn down low 7.For any cough or congestion  Use plain Mucinex- regular strength or max strength is fine   * Children- consult with Pharmacist for dosing 8. For fever or aces or pains- take tylenol or ibuprofen appropriate for age and weight.  * for fevers greater than 101 orally you may alternate ibuprofen and tylenol every  3 hours.   Meds ordered this encounter  Medications   predniSONE (DELTASONE) 20 MG tablet    Sig: Take 2 tablets (40 mg total) by mouth daily with breakfast for 5 days. 2 po daily for 5 days    Dispense:  10 tablet    Refill:  0    Order Specific Question:   Supervising Provider    Answer:   Caryl Pina A [[id number]]   azithromycin (ZITHROMAX Z-PAK) 250 MG tablet    Sig: As directed    Dispense:  6 tablet    Refill:  0    Order Specific Question:   Supervising Provider    Answer:   Caryl Pina A [[id number]]       Follow Up Instructions: prn    I discussed the assessment and treatment plan with the patient. The patient was provided an opportunity to ask questions and all were answered. The patient agreed with the  plan and demonstrated an understanding of the instructions.   The patient was advised to call back or seek an in-person evaluation if the symptoms worsen or if the condition fails to improve as anticipated.  The above assessment and management plan was discussed with the patient. The patient verbalized understanding of and has agreed to the management plan. Patient is aware to call the clinic if symptoms persist or worsen. Patient is aware when to return to the clinic for a follow-up visit. Patient educated on when it is appropriate to go to the emergency  department.  15 minutes spent with patient  Mary-Margaret Hassell Done, FNP

## 2020-11-20 ENCOUNTER — Telehealth: Payer: Self-pay | Admitting: Family Medicine

## 2020-11-20 NOTE — Telephone Encounter (Signed)
MMM contacted patient and spoke with them about results and how to treat symptoms. Patient verbalized understanding

## 2020-11-24 ENCOUNTER — Other Ambulatory Visit: Payer: Self-pay

## 2020-11-24 ENCOUNTER — Telehealth: Payer: Self-pay | Admitting: Family Medicine

## 2020-11-24 DIAGNOSIS — E785 Hyperlipidemia, unspecified: Secondary | ICD-10-CM

## 2020-11-24 DIAGNOSIS — E1169 Type 2 diabetes mellitus with other specified complication: Secondary | ICD-10-CM

## 2020-11-24 DIAGNOSIS — I10 Essential (primary) hypertension: Secondary | ICD-10-CM

## 2020-11-24 DIAGNOSIS — E0869 Diabetes mellitus due to underlying condition with other specified complication: Secondary | ICD-10-CM

## 2020-11-24 NOTE — Telephone Encounter (Signed)
Future orders placed 

## 2020-11-26 ENCOUNTER — Ambulatory Visit: Payer: Medicare PPO | Admitting: Family Medicine

## 2020-12-03 ENCOUNTER — Telehealth: Payer: Self-pay | Admitting: Family Medicine

## 2020-12-03 ENCOUNTER — Other Ambulatory Visit: Payer: Self-pay | Admitting: Family Medicine

## 2020-12-03 NOTE — Telephone Encounter (Signed)
Please review and advise.

## 2020-12-03 NOTE — Telephone Encounter (Signed)
Patient aware and verbalized understanding. °

## 2020-12-03 NOTE — Telephone Encounter (Signed)
According to the CDC recommendations, there is minimal risk of contagion after the 10 days if symptoms are improved.  He could still pass on a little bit of the virus but it is minimal and most likely he is fine especially since he has minimal symptoms.  Sometimes some people can test positive for months afterwards.

## 2020-12-24 ENCOUNTER — Telehealth: Payer: Self-pay | Admitting: Family Medicine

## 2020-12-24 NOTE — Telephone Encounter (Signed)
Pt aware to come get lab work. Made aware to stop by the front to let them know he is here.

## 2020-12-26 ENCOUNTER — Ambulatory Visit: Payer: Medicare PPO | Admitting: Family Medicine

## 2020-12-26 ENCOUNTER — Ambulatory Visit (INDEPENDENT_AMBULATORY_CARE_PROVIDER_SITE_OTHER): Payer: Medicare PPO

## 2020-12-26 ENCOUNTER — Other Ambulatory Visit: Payer: Medicare PPO

## 2020-12-26 DIAGNOSIS — Z23 Encounter for immunization: Secondary | ICD-10-CM | POA: Diagnosis not present

## 2020-12-26 DIAGNOSIS — E1169 Type 2 diabetes mellitus with other specified complication: Secondary | ICD-10-CM

## 2020-12-26 DIAGNOSIS — E0869 Diabetes mellitus due to underlying condition with other specified complication: Secondary | ICD-10-CM

## 2020-12-26 DIAGNOSIS — I10 Essential (primary) hypertension: Secondary | ICD-10-CM

## 2020-12-26 LAB — BAYER DCA HB A1C WAIVED: HB A1C (BAYER DCA - WAIVED): 7.5 % — ABNORMAL HIGH (ref 4.8–5.6)

## 2020-12-27 LAB — LIPID PANEL
Chol/HDL Ratio: 2.9 ratio (ref 0.0–5.0)
Cholesterol, Total: 140 mg/dL (ref 100–199)
HDL: 49 mg/dL (ref >39)
LDL Chol Calc (NIH): 76 mg/dL (ref 0–99)
Triglycerides: 77 mg/dL (ref 0–149)
VLDL Cholesterol Cal: 15 mg/dL (ref 5–40)

## 2020-12-27 LAB — CBC WITH DIFFERENTIAL/PLATELET
Basophils Absolute: 0.1 10*3/uL (ref 0.0–0.2)
Basos: 1 %
EOS (ABSOLUTE): 0.1 10*3/uL (ref 0.0–0.4)
Eos: 2 %
Hematocrit: 41.1 % (ref 37.5–51.0)
Hemoglobin: 13.3 g/dL (ref 13.0–17.7)
Immature Grans (Abs): 0 10*3/uL (ref 0.0–0.1)
Immature Granulocytes: 0 %
Lymphocytes Absolute: 1.6 10*3/uL (ref 0.7–3.1)
Lymphs: 32 %
MCH: 28.5 pg (ref 26.6–33.0)
MCHC: 32.4 g/dL (ref 31.5–35.7)
MCV: 88 fL (ref 79–97)
Monocytes Absolute: 0.6 10*3/uL (ref 0.1–0.9)
Monocytes: 11 %
Neutrophils Absolute: 2.7 10*3/uL (ref 1.4–7.0)
Neutrophils: 54 %
Platelets: 238 10*3/uL (ref 150–450)
RBC: 4.67 x10E6/uL (ref 4.14–5.80)
RDW: 13.2 % (ref 11.6–15.4)
WBC: 5.1 10*3/uL (ref 3.4–10.8)

## 2020-12-27 LAB — CMP14+EGFR
ALT: 16 IU/L (ref 0–44)
AST: 21 IU/L (ref 0–40)
Albumin/Globulin Ratio: 1.7 (ref 1.2–2.2)
Albumin: 4.3 g/dL (ref 3.8–4.8)
Alkaline Phosphatase: 68 IU/L (ref 44–121)
BUN/Creatinine Ratio: 15 (ref 10–24)
BUN: 19 mg/dL (ref 8–27)
Bilirubin Total: 0.2 mg/dL (ref 0.0–1.2)
CO2: 24 mmol/L (ref 20–29)
Calcium: 9.5 mg/dL (ref 8.6–10.2)
Chloride: 103 mmol/L (ref 96–106)
Creatinine, Ser: 1.29 mg/dL — ABNORMAL HIGH (ref 0.76–1.27)
Globulin, Total: 2.5 g/dL (ref 1.5–4.5)
Glucose: 145 mg/dL — ABNORMAL HIGH (ref 70–99)
Potassium: 4.9 mmol/L (ref 3.5–5.2)
Sodium: 139 mmol/L (ref 134–144)
Total Protein: 6.8 g/dL (ref 6.0–8.5)
eGFR: 60 mL/min/{1.73_m2} (ref >59)

## 2021-01-07 ENCOUNTER — Other Ambulatory Visit: Payer: Self-pay | Admitting: Family Medicine

## 2021-01-10 ENCOUNTER — Other Ambulatory Visit: Payer: Self-pay | Admitting: Family Medicine

## 2021-01-11 ENCOUNTER — Other Ambulatory Visit: Payer: Self-pay | Admitting: Family Medicine

## 2021-01-16 ENCOUNTER — Encounter: Payer: Self-pay | Admitting: Family Medicine

## 2021-01-16 ENCOUNTER — Ambulatory Visit: Payer: Medicare PPO | Admitting: Family Medicine

## 2021-01-16 VITALS — BP 155/75 | HR 60 | Ht 71.0 in | Wt 195.0 lb

## 2021-01-16 DIAGNOSIS — E1169 Type 2 diabetes mellitus with other specified complication: Secondary | ICD-10-CM | POA: Diagnosis not present

## 2021-01-16 DIAGNOSIS — I10 Essential (primary) hypertension: Secondary | ICD-10-CM | POA: Diagnosis not present

## 2021-01-16 DIAGNOSIS — Z0000 Encounter for general adult medical examination without abnormal findings: Secondary | ICD-10-CM

## 2021-01-16 DIAGNOSIS — Z0001 Encounter for general adult medical examination with abnormal findings: Secondary | ICD-10-CM

## 2021-01-16 DIAGNOSIS — N401 Enlarged prostate with lower urinary tract symptoms: Secondary | ICD-10-CM

## 2021-01-16 DIAGNOSIS — R3912 Poor urinary stream: Secondary | ICD-10-CM

## 2021-01-16 DIAGNOSIS — E0869 Diabetes mellitus due to underlying condition with other specified complication: Secondary | ICD-10-CM | POA: Diagnosis not present

## 2021-01-16 DIAGNOSIS — E785 Hyperlipidemia, unspecified: Secondary | ICD-10-CM

## 2021-01-16 MED ORDER — ATORVASTATIN CALCIUM 20 MG PO TABS: 10.0000 mg | ORAL_TABLET | Freq: Every day | ORAL | 3 refills | Status: DC

## 2021-01-16 MED ORDER — PIOGLITAZONE HCL 30 MG PO TABS: 30.0000 mg | ORAL_TABLET | Freq: Every day | ORAL | 3 refills | Status: DC

## 2021-01-16 MED ORDER — AMLODIPINE BESY-BENAZEPRIL HCL 5-10 MG PO CAPS: 1.0000 | ORAL_CAPSULE | Freq: Every day | ORAL | 3 refills | Status: DC

## 2021-01-16 MED ORDER — JANUMET 50-1000 MG PO TABS: 1.0000 | ORAL_TABLET | Freq: Two times a day (BID) | ORAL | 3 refills | Status: DC

## 2021-01-16 NOTE — Progress Notes (Signed)
BP (!) 155/75    Pulse 60    Ht '5\' 11"'  (1.803 m)    Wt 195 lb (88.5 kg)    SpO2 98%    BMI 27.20 kg/m    Subjective:   Patient ID: Tony Hodges, male    DOB: 01/24/1950, 71 y.o.   MRN: [id number]  HPI: Tony Hodges is a 71 y.o. male presenting on 01/16/2021 for Medical Management of Chronic Issues, Diabetes, and Hyperlipidemia   HPI Adult well exam and physical Patient denies any chest pain, shortness of breath, headaches or vision issues, abdominal complaints, diarrhea, nausea, vomiting, or joint issues.  He does wake up a few times at night  Type 2 diabetes mellitus Patient comes in today for recheck of his diabetes. Patient has been currently taking Actos and Janumet, A1c is up slightly at 7.5.. Patient is not currently on an ACE inhibitor/ARB. Patient has seen an ophthalmologist this year. Patient denies any issues with their feet. The symptom started onset as an adult hypertension and hyperlipidemia ARE RELATED TO DM   Hypertension Patient is currently on amlodipine-benazepril, and their blood pressure today is 162/68 and 155/77. Patient denies any lightheadedness or dizziness. Patient denies headaches, blurred vision, chest pains, shortness of breath, or weakness. Denies any side effects from medication and is content with current medication.   Hyperlipidemia Patient is coming in for recheck of his hyperlipidemia. The patient is currently taking Lipitor. They deny any issues with myalgias or history of liver damage from it. They deny any focal numbness or weakness or chest pain.   Relevant past medical, surgical, family and social history reviewed and updated as indicated. Interim medical history since our last visit reviewed. Allergies and medications reviewed and updated.  Review of Systems  Constitutional:  Negative for chills and fever.  HENT:  Negative for ear pain and tinnitus.   Eyes:  Negative for pain and visual disturbance.  Respiratory:  Negative for cough,  shortness of breath and wheezing.   Cardiovascular:  Negative for chest pain, palpitations and leg swelling.  Gastrointestinal:  Negative for abdominal pain, blood in stool, constipation and diarrhea.  Genitourinary:  Negative for dysuria and hematuria.  Musculoskeletal:  Negative for back pain, gait problem and myalgias.  Skin:  Negative for rash.  Neurological:  Negative for dizziness, weakness, light-headedness and headaches.  Psychiatric/Behavioral:  Negative for suicidal ideas.   All other systems reviewed and are negative.  Per HPI unless specifically indicated above   Allergies as of 01/16/2021       Reactions   Other         Medication List        Accurate as of January 16, 2021 12:12 PM. If you have any questions, ask your nurse or doctor.          STOP taking these medications    azithromycin 250 MG tablet Commonly known as: Zithromax Z-Pak Stopped by: Fransisca Kaufmann Vance Belcourt, MD       TAKE these medications    Accu-Chek Guide test strip Generic drug: glucose blood TEST TWO TIMES DAILY AS NEEDED. E08.69   Accu-Chek Guide w/Device Kit 1 each by Does not apply route 2 (two) times daily as needed.   amLODipine-benazepril 5-10 MG capsule Commonly known as: LOTREL Take 1 capsule by mouth daily. What changed: how much to take Changed by: Worthy Rancher, MD   aspirin 81 MG EC tablet Take 81 mg by mouth daily.   atorvastatin 20 MG  tablet Commonly known as: LIPITOR Take 0.5 tablets (10 mg total) by mouth at bedtime. What changed: See the new instructions. Changed by: Fransisca Kaufmann Anieya Helman, MD   diclofenac sodium 1 % Gel Commonly known as: VOLTAREN APPLY 2 GRAMS TO AFFECTED AREA 4 TIMES A DAY   Janumet 50-1000 MG tablet Generic drug: sitaGLIPtin-metformin Take 1 tablet by mouth 2 (two) times daily with a meal.   multivitamin capsule Take 1 capsule by mouth daily.   pioglitazone 30 MG tablet Commonly known as: ACTOS Take 1 tablet (30 mg total) by  mouth daily.         Objective:   BP (!) 155/75    Pulse 60    Ht '5\' 11"'  (1.803 m)    Wt 195 lb (88.5 kg)    SpO2 98%    BMI 27.20 kg/m   Wt Readings from Last 3 Encounters:  01/16/21 195 lb (88.5 kg)  05/29/20 193 lb (87.5 kg)  02/28/20 195 lb 9.6 oz (88.7 kg)    Physical Exam Vitals reviewed.  Constitutional:      General: He is not in acute distress.    Appearance: He is well-developed. He is not diaphoretic.  HENT:     Right Ear: External ear normal.     Left Ear: External ear normal.     Nose: Nose normal.     Mouth/Throat:     Pharynx: No oropharyngeal exudate.  Eyes:     General: No scleral icterus.       Right eye: No discharge.     Conjunctiva/sclera: Conjunctivae normal.     Pupils: Pupils are equal, round, and reactive to light.  Neck:     Thyroid: No thyromegaly.  Cardiovascular:     Rate and Rhythm: Normal rate and regular rhythm.     Heart sounds: Normal heart sounds. No murmur heard. Pulmonary:     Effort: Pulmonary effort is normal. No respiratory distress.     Breath sounds: Normal breath sounds. No wheezing.  Abdominal:     General: Bowel sounds are normal. There is no distension.     Palpations: Abdomen is soft.     Tenderness: There is no abdominal tenderness. There is no guarding or rebound.  Musculoskeletal:        General: Normal range of motion.     Cervical back: Neck supple.  Lymphadenopathy:     Cervical: No cervical adenopathy.  Skin:    General: Skin is warm and dry.     Findings: No rash.  Neurological:     Mental Status: He is alert and oriented to person, place, and time.     Coordination: Coordination normal.  Psychiatric:        Behavior: Behavior normal.    Results for orders placed or performed in visit on 12/26/20  Bayer DCA Hb A1c Waived  Result Value Ref Range   HB A1C (BAYER DCA - WAIVED) 7.5 (H) 4.8 - 5.6 %  Lipid panel  Result Value Ref Range   Cholesterol, Total 140 100 - 199 mg/dL   Triglycerides 77 0 - 149  mg/dL   HDL 49 >39 mg/dL   VLDL Cholesterol Cal 15 5 - 40 mg/dL   LDL Chol Calc (NIH) 76 0 - 99 mg/dL   Chol/HDL Ratio 2.9 0.0 - 5.0 ratio  CMP14+EGFR  Result Value Ref Range   Glucose 145 (H) 70 - 99 mg/dL   BUN 19 8 - 27 mg/dL   Creatinine, Ser 1.29 (H)  0.76 - 1.27 mg/dL   eGFR 60 >59 mL/min/1.73   BUN/Creatinine Ratio 15 10 - 24   Sodium 139 134 - 144 mmol/L   Potassium 4.9 3.5 - 5.2 mmol/L   Chloride 103 96 - 106 mmol/L   CO2 24 20 - 29 mmol/L   Calcium 9.5 8.6 - 10.2 mg/dL   Total Protein 6.8 6.0 - 8.5 g/dL   Albumin 4.3 3.8 - 4.8 g/dL   Globulin, Total 2.5 1.5 - 4.5 g/dL   Albumin/Globulin Ratio 1.7 1.2 - 2.2   Bilirubin Total 0.2 0.0 - 1.2 mg/dL   Alkaline Phosphatase 68 44 - 121 IU/L   AST 21 0 - 40 IU/L   ALT 16 0 - 44 IU/L  CBC with Differential/Platelet  Result Value Ref Range   WBC 5.1 3.4 - 10.8 x10E3/uL   RBC 4.67 4.14 - 5.80 x10E6/uL   Hemoglobin 13.3 13.0 - 17.7 g/dL   Hematocrit 41.1 37.5 - 51.0 %   MCV 88 79 - 97 fL   MCH 28.5 26.6 - 33.0 pg   MCHC 32.4 31.5 - 35.7 g/dL   RDW 13.2 11.6 - 15.4 %   Platelets 238 150 - 450 x10E3/uL   Neutrophils 54 Not Estab. %   Lymphs 32 Not Estab. %   Monocytes 11 Not Estab. %   Eos 2 Not Estab. %   Basos 1 Not Estab. %   Neutrophils Absolute 2.7 1.4 - 7.0 x10E3/uL   Lymphocytes Absolute 1.6 0.7 - 3.1 x10E3/uL   Monocytes Absolute 0.6 0.1 - 0.9 x10E3/uL   EOS (ABSOLUTE) 0.1 0.0 - 0.4 x10E3/uL   Basophils Absolute 0.1 0.0 - 0.2 x10E3/uL   Immature Granulocytes 0 Not Estab. %   Immature Grans (Abs) 0.0 0.0 - 0.1 x10E3/uL    Assessment & Plan:   Problem List Items Addressed This Visit       Cardiovascular and Mediastinum   HTN (hypertension)   Relevant Medications   atorvastatin (LIPITOR) 20 MG tablet   amLODipine-benazepril (LOTREL) 5-10 MG capsule     Endocrine   Hyperlipidemia associated with type 2 diabetes mellitus (HCC)   Relevant Medications   pioglitazone (ACTOS) 30 MG tablet    sitaGLIPtin-metformin (JANUMET) 50-1000 MG tablet   atorvastatin (LIPITOR) 20 MG tablet   amLODipine-benazepril (LOTREL) 5-10 MG capsule   Diabetes mellitus (HCC)   Relevant Medications   pioglitazone (ACTOS) 30 MG tablet   sitaGLIPtin-metformin (JANUMET) 50-1000 MG tablet   atorvastatin (LIPITOR) 20 MG tablet   amLODipine-benazepril (LOTREL) 5-10 MG capsule   Other Relevant Orders   Bayer DCA Hb A1c Waived     Genitourinary   BPH (benign prostatic hyperplasia)   Relevant Orders   PSA, total and free   Other Visit Diagnoses     Well adult exam    -  Primary     Continue current medicine, patient will mainly focus on diet and lifestyle modification and eating right and see if he can bring his blood sugar back down, if not we may consider other medicines in the future.  Blood pressure elevated and he will send me some numbers over the next 2 weeks he will check it every day at home and send it to me via MyChart.  He says it runs better at home  Follow up plan: Return in about 3 months (around 04/16/2021), or if symptoms worsen or fail to improve, for Diabetes recheck.  Counseling provided for all of the vaccine components Orders Placed This Encounter  Procedures   Bayer DCA Hb A1c Waived   PSA, total and free    Caryl Pina, MD [facility] 01/16/2021, 12:12 PM

## 2021-02-02 ENCOUNTER — Ambulatory Visit: Payer: Medicare PPO | Admitting: Family Medicine

## 2021-02-14 ENCOUNTER — Encounter: Payer: Self-pay | Admitting: Family Medicine

## 2021-04-27 ENCOUNTER — Encounter: Payer: Self-pay | Admitting: Family Medicine

## 2021-04-29 ENCOUNTER — Ambulatory Visit: Payer: Medicare PPO | Admitting: Family Medicine

## 2021-04-29 ENCOUNTER — Encounter: Payer: Self-pay | Admitting: Family Medicine

## 2021-04-29 VITALS — BP 137/66 | HR 69 | Ht 71.0 in | Wt 193.0 lb

## 2021-04-29 DIAGNOSIS — E1169 Type 2 diabetes mellitus with other specified complication: Secondary | ICD-10-CM | POA: Diagnosis not present

## 2021-04-29 DIAGNOSIS — E0869 Diabetes mellitus due to underlying condition with other specified complication: Secondary | ICD-10-CM

## 2021-04-29 DIAGNOSIS — I10 Essential (primary) hypertension: Secondary | ICD-10-CM | POA: Diagnosis not present

## 2021-04-29 DIAGNOSIS — N401 Enlarged prostate with lower urinary tract symptoms: Secondary | ICD-10-CM | POA: Diagnosis not present

## 2021-04-29 DIAGNOSIS — E785 Hyperlipidemia, unspecified: Secondary | ICD-10-CM | POA: Diagnosis not present

## 2021-04-29 DIAGNOSIS — R3912 Poor urinary stream: Secondary | ICD-10-CM | POA: Diagnosis not present

## 2021-04-29 LAB — BAYER DCA HB A1C WAIVED: HB A1C (BAYER DCA - WAIVED): 6.9 % — ABNORMAL HIGH (ref 4.8–5.6)

## 2021-04-29 NOTE — Progress Notes (Signed)
? ?BP 137/66   Pulse 69   Ht '5\' 11"'  (1.803 m)   Wt 193 lb (87.5 kg)   SpO2 100%   BMI 26.92 kg/m?   ? ?Subjective:  ? ?Patient ID: Tony Hodges, male    DOB: February 05, 1950, 71 y.o.   MRN: [id number] ? ?HPI: ?Tony Hodges is a 71 y.o. male presenting on 04/29/2021 for Medical Management of Chronic Issues, Diabetes, Hyperlipidemia, and Hypertension ? ? ?HPI ?Type 2 diabetes mellitus ?Patient comes in today for recheck of his diabetes. Patient has been currently taking Janumet and Actos. Patient is currently on an ACE inhibitor/ARB. Patient has not seen an ophthalmologist this year. Patient denies any issues with their feet. The symptom started onset as an adult hypertension and hyperlipidemia ARE RELATED TO DM  ? ?Hypertension ?Patient is currently on amlodipine-benazepril, and their blood pressure today is 137/66. Patient denies any lightheadedness or dizziness. Patient denies headaches, blurred vision, chest pains, shortness of breath, or weakness. Denies any side effects from medication and is content with current medication.  ? ?Hyperlipidemia ?Patient is coming in for recheck of his hyperlipidemia. The patient is currently taking atorvastatin. They deny any issues with myalgias or history of liver damage from it. They deny any focal numbness or weakness or chest pain.  ? ?BPH ?Patient is coming in for recheck on BPH ?Symptoms: None currently ?Medication: None ?Last PSA: Over a year ago, will check today ? ?Relevant past medical, surgical, family and social history reviewed and updated as indicated. Interim medical history since our last visit reviewed. ?Allergies and medications reviewed and updated. ? ?Review of Systems  ?Constitutional:  Negative for chills and fever.  ?Eyes:  Negative for visual disturbance.  ?Respiratory:  Negative for shortness of breath and wheezing.   ?Cardiovascular:  Negative for chest pain and leg swelling.  ?Musculoskeletal:  Negative for back pain and gait problem.  ?Skin:   Negative for rash.  ?Neurological:  Negative for dizziness, weakness and light-headedness.  ?All other systems reviewed and are negative. ? ?Per HPI unless specifically indicated above ? ? ?Allergies as of 04/29/2021   ? ?   Reactions  ? Other   ? ?  ? ?  ?Medication List  ?  ? ?  ? Accurate as of April 29, 2021  9:39 AM. If you have any questions, ask your nurse or doctor.  ?  ?  ? ?  ? ?Accu-Chek Guide test strip ?Generic drug: glucose blood ?TEST TWO TIMES DAILY AS NEEDED. E08.69 ?  ?Accu-Chek Guide w/Device Kit ?1 each by Does not apply route 2 (two) times daily as needed. ?  ?amLODipine-benazepril 5-10 MG capsule ?Commonly known as: LOTREL ?Take 1 capsule by mouth daily. ?  ?aspirin 81 MG EC tablet ?Take 81 mg by mouth daily. ?  ?atorvastatin 20 MG tablet ?Commonly known as: LIPITOR ?Take 0.5 tablets (10 mg total) by mouth at bedtime. ?  ?diclofenac sodium 1 % Gel ?Commonly known as: VOLTAREN ?APPLY 2 GRAMS TO AFFECTED AREA 4 TIMES A DAY ?  ?Janumet 50-1000 MG tablet ?Generic drug: sitaGLIPtin-metformin ?Take 1 tablet by mouth 2 (two) times daily with a meal. ?  ?multivitamin capsule ?Take 1 capsule by mouth daily. ?  ?pioglitazone 30 MG tablet ?Commonly known as: ACTOS ?Take 1 tablet (30 mg total) by mouth daily. ?  ? ?  ? ? ? ?Objective:  ? ?BP 137/66   Pulse 69   Ht '5\' 11"'  (1.803 m)   Wt 193 lb (  87.5 kg)   SpO2 100%   BMI 26.92 kg/m?   ?Wt Readings from Last 3 Encounters:  ?04/29/21 193 lb (87.5 kg)  ?01/16/21 195 lb (88.5 kg)  ?05/29/20 193 lb (87.5 kg)  ?  ?Physical Exam ?Vitals and nursing note reviewed.  ?Constitutional:   ?   General: He is not in acute distress. ?   Appearance: He is well-developed. He is not diaphoretic.  ?Eyes:  ?   General: No scleral icterus. ?   Conjunctiva/sclera: Conjunctivae normal.  ?Neck:  ?   Thyroid: No thyromegaly.  ?Cardiovascular:  ?   Rate and Rhythm: Normal rate and regular rhythm.  ?   Heart sounds: Normal heart sounds. No murmur heard. ?Pulmonary:  ?   Effort:  Pulmonary effort is normal. No respiratory distress.  ?   Breath sounds: Normal breath sounds. No wheezing.  ?Musculoskeletal:     ?   General: No swelling. Normal range of motion.  ?   Cervical back: Neck supple.  ?Lymphadenopathy:  ?   Cervical: No cervical adenopathy.  ?Skin: ?   General: Skin is warm and dry.  ?   Findings: No rash.  ?Neurological:  ?   Mental Status: He is alert and oriented to person, place, and time.  ?   Coordination: Coordination normal.  ?Psychiatric:     ?   Behavior: Behavior normal.  ? ? ? ? ?Assessment & Plan:  ? ?Problem List Items Addressed This Visit   ? ?  ? Cardiovascular and Mediastinum  ? HTN (hypertension)  ?  ? Endocrine  ? Hyperlipidemia associated with type 2 diabetes mellitus ([place]) - Primary  ? Diabetes mellitus ([place])  ?  ? Genitourinary  ? BPH (benign prostatic hyperplasia)  ?  ?A1c 6.9, continue current medicine, he is focusing on diet and continue with that.  Blood pressure looks good.  No changes ?Follow up plan: ?Return in about 3 months (around 07/29/2021), or if symptoms worsen or fail to improve, for Diabetes and hypertension and cholesterol. ? ?Counseling provided for all of the vaccine components ?No orders of the defined types were placed in this encounter. ? ? ?Caryl Pina, MD ?[place] ?04/29/2021, 9:39 AM ? ? ? ? ?

## 2021-04-30 LAB — PSA, TOTAL AND FREE
PSA, Free Pct: 38.3 %
PSA, Free: 0.46 ng/mL
Prostate Specific Ag, Serum: 1.2 ng/mL (ref 0.0–4.0)

## 2021-08-10 ENCOUNTER — Ambulatory Visit: Payer: Medicare PPO | Admitting: Family Medicine

## 2021-08-10 ENCOUNTER — Encounter: Payer: Self-pay | Admitting: Family Medicine

## 2021-08-10 VITALS — BP 119/62 | HR 74 | Ht 71.0 in | Wt 192.0 lb

## 2021-08-10 DIAGNOSIS — E0869 Diabetes mellitus due to underlying condition with other specified complication: Secondary | ICD-10-CM | POA: Diagnosis not present

## 2021-08-10 DIAGNOSIS — R3912 Poor urinary stream: Secondary | ICD-10-CM

## 2021-08-10 DIAGNOSIS — N401 Enlarged prostate with lower urinary tract symptoms: Secondary | ICD-10-CM

## 2021-08-10 DIAGNOSIS — I10 Essential (primary) hypertension: Secondary | ICD-10-CM | POA: Diagnosis not present

## 2021-08-10 DIAGNOSIS — E785 Hyperlipidemia, unspecified: Secondary | ICD-10-CM | POA: Diagnosis not present

## 2021-08-10 DIAGNOSIS — R6889 Other general symptoms and signs: Secondary | ICD-10-CM | POA: Diagnosis not present

## 2021-08-10 DIAGNOSIS — E1169 Type 2 diabetes mellitus with other specified complication: Secondary | ICD-10-CM

## 2021-08-10 LAB — BAYER DCA HB A1C WAIVED: HB A1C (BAYER DCA - WAIVED): 6.8 % — ABNORMAL HIGH (ref 4.8–5.6)

## 2021-08-10 MED ORDER — TAMSULOSIN HCL 0.4 MG PO CAPS: 0.4000 mg | ORAL_CAPSULE | Freq: Every day | ORAL | 3 refills | Status: DC

## 2021-08-10 NOTE — Addendum Note (Signed)
Addended by: Caryl Pina on: 08/10/2021 09:11 AM   Modules accepted: Orders

## 2021-08-10 NOTE — Progress Notes (Signed)
BP 119/62   Pulse 74   Ht '5\' 11"'  (1.803 m)   Wt 192 lb (87.1 kg)   SpO2 99%   BMI 26.78 kg/m    Subjective:   Patient ID: Tony Hodges, male    DOB: 1950-08-02, 71 y.o.   MRN: [id number]  HPI: Tony Hodges is a 71 y.o. male presenting on 08/10/2021 for Medical Management of Chronic Issues, Hypertension, Hyperlipidemia, and Diabetes   HPI Type 2 diabetes mellitus Patient comes in today for recheck of his diabetes. Patient has been currently taking Janumet and Actos. Patient is currently on an ACE inhibitor/ARB. Patient has not seen an ophthalmologist this year. Patient denies any issues with their feet except for he did break his small toe on the right foot couple weeks ago but it is healing and there is no wounds. The symptom started onset as an adult hypertension and hyperlipidemia ARE RELATED TO DM   Hypertension Patient is currently on amlodipine and benazepril, and their blood pressure today is 119/62. Patient denies any lightheadedness or dizziness. Patient denies headaches, blurred vision, chest pains, shortness of breath, or weakness. Denies any side effects from medication and is content with current medication.   Hyperlipidemia Patient is coming in for recheck of his hyperlipidemia. The patient is currently taking atorvastatin. They deny any issues with myalgias or history of liver damage from it. They deny any focal numbness or weakness or chest pain.   BPH Patient is coming in for recheck on BPH Symptoms: Nocturia and urinary hesitancy Medication: None currently but he does want to try Flomax Last PSA: April 2023 was four  Relevant past medical, surgical, family and social history reviewed and updated as indicated. Interim medical history since our last visit reviewed. Allergies and medications reviewed and updated.  Review of Systems  Constitutional:  Negative for chills and fever.  Respiratory:  Negative for cough, shortness of breath and wheezing.    Cardiovascular:  Negative for chest pain, palpitations and leg swelling.  Gastrointestinal:  Negative for abdominal pain, blood in stool, constipation and diarrhea.  Genitourinary:  Positive for difficulty urinating and frequency. Negative for dysuria and hematuria.  Musculoskeletal:  Negative for back pain, gait problem and myalgias.  Skin:  Negative for rash.  Neurological:  Negative for dizziness, weakness and headaches.  Psychiatric/Behavioral:  Negative for suicidal ideas.   All other systems reviewed and are negative.   Per HPI unless specifically indicated above   Allergies as of 08/10/2021       Reactions   Other         Medication List        Accurate as of August 10, 2021  8:47 AM. If you have any questions, ask your nurse or doctor.          Accu-Chek Guide test strip Generic drug: glucose blood TEST TWO TIMES DAILY AS NEEDED. E08.69   Accu-Chek Guide w/Device Kit 1 each by Does not apply route 2 (two) times daily as needed.   amLODipine-benazepril 5-10 MG capsule Commonly known as: LOTREL Take 1 capsule by mouth daily.   aspirin EC 81 MG tablet Take 81 mg by mouth daily.   atorvastatin 20 MG tablet Commonly known as: LIPITOR Take 0.5 tablets (10 mg total) by mouth at bedtime.   diclofenac sodium 1 % Gel Commonly known as: VOLTAREN APPLY 2 GRAMS TO AFFECTED AREA 4 TIMES A DAY   Janumet 50-1000 MG tablet Generic drug: sitaGLIPtin-metformin Take 1 tablet by mouth  2 (two) times daily with a meal.   multivitamin capsule Take 1 capsule by mouth daily.   pioglitazone 30 MG tablet Commonly known as: ACTOS Take 1 tablet (30 mg total) by mouth daily.         Objective:   BP 119/62   Pulse 74   Ht '5\' 11"'  (1.803 m)   Wt 192 lb (87.1 kg)   SpO2 99%   BMI 26.78 kg/m   Wt Readings from Last 3 Encounters:  08/10/21 192 lb (87.1 kg)  04/29/21 193 lb (87.5 kg)  01/16/21 195 lb (88.5 kg)    Physical Exam Vitals and nursing note reviewed.   Constitutional:      General: He is not in acute distress.    Appearance: He is well-developed. He is not diaphoretic.  Eyes:     General: No scleral icterus.    Conjunctiva/sclera: Conjunctivae normal.  Neck:     Thyroid: No thyromegaly.  Cardiovascular:     Rate and Rhythm: Normal rate and regular rhythm.     Heart sounds: Normal heart sounds. No murmur heard. Pulmonary:     Effort: Pulmonary effort is normal. No respiratory distress.     Breath sounds: Normal breath sounds. No wheezing.  Musculoskeletal:        General: Normal range of motion.     Cervical back: Neck supple.  Lymphadenopathy:     Cervical: No cervical adenopathy.  Skin:    General: Skin is warm and dry.     Findings: No rash.  Neurological:     Mental Status: He is alert and oriented to person, place, and time.     Coordination: Coordination normal.  Psychiatric:        Behavior: Behavior normal.       Assessment & Plan:   Problem List Items Addressed This Visit       Cardiovascular and Mediastinum   HTN (hypertension)     Endocrine   Hyperlipidemia associated with type 2 diabetes mellitus ([place])   Relevant Orders   CBC with Differential/Platelet   CMP14+EGFR   Lipid panel   Bayer DCA Hb A1c Waived   Diabetes mellitus ([place]) - Primary   Relevant Orders   CBC with Differential/Platelet   CMP14+EGFR   Lipid panel   Bayer DCA Hb A1c Waived   Other Visit Diagnoses     Essential hypertension       Relevant Orders   CBC with Differential/Platelet   CMP14+EGFR   Lipid panel   Bayer DCA Hb A1c Waived     Blood pressure looks good and A1c looks good at 6.8.  The rest of his blood work is pending.  No changes in medication.  Patient will get his eye exam next month  Follow up plan: Return in about 3 months (around 11/10/2021), or if symptoms worsen or fail to improve, for Hypertension and hyperlipidemia and diabetes.  Counseling provided for all of the vaccine components Orders Placed  This Encounter  Procedures   CBC with Differential/Platelet   CMP14+EGFR   Lipid panel   Bayer DCA Hb A1c Tool, MD [facility] 08/10/2021, 8:47 AM

## 2021-08-11 LAB — CBC WITH DIFFERENTIAL/PLATELET
Basophils Absolute: 0 10*3/uL (ref 0.0–0.2)
Basos: 1 %
EOS (ABSOLUTE): 0.1 10*3/uL (ref 0.0–0.4)
Eos: 2 %
Hematocrit: 43 % (ref 37.5–51.0)
Hemoglobin: 13 g/dL (ref 13.0–17.7)
Immature Grans (Abs): 0 10*3/uL (ref 0.0–0.1)
Immature Granulocytes: 0 %
Lymphocytes Absolute: 1.7 10*3/uL (ref 0.7–3.1)
Lymphs: 32 %
MCH: 26.8 pg (ref 26.6–33.0)
MCHC: 30.2 g/dL — ABNORMAL LOW (ref 31.5–35.7)
MCV: 89 fL (ref 79–97)
Monocytes Absolute: 0.5 10*3/uL (ref 0.1–0.9)
Monocytes: 9 %
Neutrophils Absolute: 3 10*3/uL (ref 1.4–7.0)
Neutrophils: 56 %
Platelets: 250 10*3/uL (ref 150–450)
RBC: 4.85 x10E6/uL (ref 4.14–5.80)
RDW: 14.5 % (ref 11.6–15.4)
WBC: 5.4 10*3/uL (ref 3.4–10.8)

## 2021-08-11 LAB — LIPID PANEL
Chol/HDL Ratio: 2.5 ratio (ref 0.0–5.0)
Cholesterol, Total: 129 mg/dL (ref 100–199)
HDL: 52 mg/dL (ref >39)
LDL Chol Calc (NIH): 64 mg/dL (ref 0–99)
Triglycerides: 64 mg/dL (ref 0–149)
VLDL Cholesterol Cal: 13 mg/dL (ref 5–40)

## 2021-08-11 LAB — CMP14+EGFR
ALT: 11 IU/L (ref 0–44)
AST: 18 IU/L (ref 0–40)
Albumin/Globulin Ratio: 1.8 (ref 1.2–2.2)
Albumin: 4.4 g/dL (ref 3.9–4.9)
Alkaline Phosphatase: 55 IU/L (ref 44–121)
BUN/Creatinine Ratio: 14 (ref 10–24)
BUN: 20 mg/dL (ref 8–27)
Bilirubin Total: 0.3 mg/dL (ref 0.0–1.2)
CO2: 24 mmol/L (ref 20–29)
Calcium: 9.3 mg/dL (ref 8.6–10.2)
Chloride: 100 mmol/L (ref 96–106)
Creatinine, Ser: 1.39 mg/dL — ABNORMAL HIGH (ref 0.76–1.27)
Globulin, Total: 2.4 g/dL (ref 1.5–4.5)
Glucose: 123 mg/dL — ABNORMAL HIGH (ref 70–99)
Potassium: 4.5 mmol/L (ref 3.5–5.2)
Sodium: 137 mmol/L (ref 134–144)
Total Protein: 6.8 g/dL (ref 6.0–8.5)
eGFR: 55 mL/min/{1.73_m2} — ABNORMAL LOW (ref >59)

## 2021-08-17 DIAGNOSIS — S233XXA Sprain of ligaments of thoracic spine, initial encounter: Secondary | ICD-10-CM | POA: Diagnosis not present

## 2021-08-17 DIAGNOSIS — M9902 Segmental and somatic dysfunction of thoracic region: Secondary | ICD-10-CM | POA: Diagnosis not present

## 2021-08-17 DIAGNOSIS — M9901 Segmental and somatic dysfunction of cervical region: Secondary | ICD-10-CM | POA: Diagnosis not present

## 2021-08-17 DIAGNOSIS — S134XXA Sprain of ligaments of cervical spine, initial encounter: Secondary | ICD-10-CM | POA: Diagnosis not present

## 2021-08-17 DIAGNOSIS — S338XXA Sprain of other parts of lumbar spine and pelvis, initial encounter: Secondary | ICD-10-CM | POA: Diagnosis not present

## 2021-08-17 DIAGNOSIS — M9903 Segmental and somatic dysfunction of lumbar region: Secondary | ICD-10-CM | POA: Diagnosis not present

## 2021-08-26 DIAGNOSIS — S233XXA Sprain of ligaments of thoracic spine, initial encounter: Secondary | ICD-10-CM | POA: Diagnosis not present

## 2021-08-26 DIAGNOSIS — M9901 Segmental and somatic dysfunction of cervical region: Secondary | ICD-10-CM | POA: Diagnosis not present

## 2021-08-26 DIAGNOSIS — S338XXA Sprain of other parts of lumbar spine and pelvis, initial encounter: Secondary | ICD-10-CM | POA: Diagnosis not present

## 2021-08-26 DIAGNOSIS — M9903 Segmental and somatic dysfunction of lumbar region: Secondary | ICD-10-CM | POA: Diagnosis not present

## 2021-08-26 DIAGNOSIS — S134XXA Sprain of ligaments of cervical spine, initial encounter: Secondary | ICD-10-CM | POA: Diagnosis not present

## 2021-08-26 DIAGNOSIS — M9902 Segmental and somatic dysfunction of thoracic region: Secondary | ICD-10-CM | POA: Diagnosis not present

## 2021-09-07 DIAGNOSIS — M9901 Segmental and somatic dysfunction of cervical region: Secondary | ICD-10-CM | POA: Diagnosis not present

## 2021-09-07 DIAGNOSIS — M9903 Segmental and somatic dysfunction of lumbar region: Secondary | ICD-10-CM | POA: Diagnosis not present

## 2021-09-07 DIAGNOSIS — S338XXA Sprain of other parts of lumbar spine and pelvis, initial encounter: Secondary | ICD-10-CM | POA: Diagnosis not present

## 2021-09-07 DIAGNOSIS — S134XXA Sprain of ligaments of cervical spine, initial encounter: Secondary | ICD-10-CM | POA: Diagnosis not present

## 2021-09-07 DIAGNOSIS — S233XXA Sprain of ligaments of thoracic spine, initial encounter: Secondary | ICD-10-CM | POA: Diagnosis not present

## 2021-09-07 DIAGNOSIS — M9902 Segmental and somatic dysfunction of thoracic region: Secondary | ICD-10-CM | POA: Diagnosis not present

## 2021-09-08 DIAGNOSIS — H5203 Hypermetropia, bilateral: Secondary | ICD-10-CM | POA: Diagnosis not present

## 2021-09-08 LAB — HM DIABETES EYE EXAM

## 2021-09-18 DIAGNOSIS — M9902 Segmental and somatic dysfunction of thoracic region: Secondary | ICD-10-CM | POA: Diagnosis not present

## 2021-09-18 DIAGNOSIS — M9901 Segmental and somatic dysfunction of cervical region: Secondary | ICD-10-CM | POA: Diagnosis not present

## 2021-09-18 DIAGNOSIS — S134XXA Sprain of ligaments of cervical spine, initial encounter: Secondary | ICD-10-CM | POA: Diagnosis not present

## 2021-09-18 DIAGNOSIS — S338XXA Sprain of other parts of lumbar spine and pelvis, initial encounter: Secondary | ICD-10-CM | POA: Diagnosis not present

## 2021-09-18 DIAGNOSIS — S233XXA Sprain of ligaments of thoracic spine, initial encounter: Secondary | ICD-10-CM | POA: Diagnosis not present

## 2021-09-18 DIAGNOSIS — M9903 Segmental and somatic dysfunction of lumbar region: Secondary | ICD-10-CM | POA: Diagnosis not present

## 2021-09-30 DIAGNOSIS — Z808 Family history of malignant neoplasm of other organs or systems: Secondary | ICD-10-CM | POA: Diagnosis not present

## 2021-09-30 DIAGNOSIS — D485 Neoplasm of uncertain behavior of skin: Secondary | ICD-10-CM | POA: Diagnosis not present

## 2021-09-30 DIAGNOSIS — L820 Inflamed seborrheic keratosis: Secondary | ICD-10-CM | POA: Diagnosis not present

## 2021-09-30 DIAGNOSIS — I781 Nevus, non-neoplastic: Secondary | ICD-10-CM | POA: Diagnosis not present

## 2021-09-30 DIAGNOSIS — C4441 Basal cell carcinoma of skin of scalp and neck: Secondary | ICD-10-CM | POA: Diagnosis not present

## 2021-09-30 DIAGNOSIS — D234 Other benign neoplasm of skin of scalp and neck: Secondary | ICD-10-CM | POA: Diagnosis not present

## 2021-09-30 DIAGNOSIS — L821 Other seborrheic keratosis: Secondary | ICD-10-CM | POA: Diagnosis not present

## 2021-10-06 ENCOUNTER — Encounter: Payer: Self-pay | Admitting: *Deleted

## 2021-10-07 DIAGNOSIS — M9901 Segmental and somatic dysfunction of cervical region: Secondary | ICD-10-CM | POA: Diagnosis not present

## 2021-10-07 DIAGNOSIS — S338XXA Sprain of other parts of lumbar spine and pelvis, initial encounter: Secondary | ICD-10-CM | POA: Diagnosis not present

## 2021-10-07 DIAGNOSIS — M9902 Segmental and somatic dysfunction of thoracic region: Secondary | ICD-10-CM | POA: Diagnosis not present

## 2021-10-07 DIAGNOSIS — S134XXA Sprain of ligaments of cervical spine, initial encounter: Secondary | ICD-10-CM | POA: Diagnosis not present

## 2021-10-07 DIAGNOSIS — M9903 Segmental and somatic dysfunction of lumbar region: Secondary | ICD-10-CM | POA: Diagnosis not present

## 2021-10-07 DIAGNOSIS — S233XXA Sprain of ligaments of thoracic spine, initial encounter: Secondary | ICD-10-CM | POA: Diagnosis not present

## 2021-10-26 DIAGNOSIS — M9901 Segmental and somatic dysfunction of cervical region: Secondary | ICD-10-CM | POA: Diagnosis not present

## 2021-10-26 DIAGNOSIS — M9903 Segmental and somatic dysfunction of lumbar region: Secondary | ICD-10-CM | POA: Diagnosis not present

## 2021-10-26 DIAGNOSIS — S134XXA Sprain of ligaments of cervical spine, initial encounter: Secondary | ICD-10-CM | POA: Diagnosis not present

## 2021-10-26 DIAGNOSIS — S233XXA Sprain of ligaments of thoracic spine, initial encounter: Secondary | ICD-10-CM | POA: Diagnosis not present

## 2021-10-26 DIAGNOSIS — M9902 Segmental and somatic dysfunction of thoracic region: Secondary | ICD-10-CM | POA: Diagnosis not present

## 2021-10-26 DIAGNOSIS — S338XXA Sprain of other parts of lumbar spine and pelvis, initial encounter: Secondary | ICD-10-CM | POA: Diagnosis not present

## 2021-11-06 ENCOUNTER — Encounter: Payer: Self-pay | Admitting: Family Medicine

## 2021-11-06 ENCOUNTER — Ambulatory Visit: Payer: Medicare PPO | Admitting: Family Medicine

## 2021-11-06 VITALS — BP 112/66 | HR 68 | Temp 98.8°F | Ht 71.0 in | Wt 193.0 lb

## 2021-11-06 DIAGNOSIS — R6889 Other general symptoms and signs: Secondary | ICD-10-CM | POA: Diagnosis not present

## 2021-11-06 DIAGNOSIS — E785 Hyperlipidemia, unspecified: Secondary | ICD-10-CM | POA: Diagnosis not present

## 2021-11-06 DIAGNOSIS — Z23 Encounter for immunization: Secondary | ICD-10-CM

## 2021-11-06 DIAGNOSIS — I10 Essential (primary) hypertension: Secondary | ICD-10-CM | POA: Diagnosis not present

## 2021-11-06 DIAGNOSIS — E0869 Diabetes mellitus due to underlying condition with other specified complication: Secondary | ICD-10-CM | POA: Diagnosis not present

## 2021-11-06 DIAGNOSIS — E1169 Type 2 diabetes mellitus with other specified complication: Secondary | ICD-10-CM

## 2021-11-06 LAB — BAYER DCA HB A1C WAIVED: HB A1C (BAYER DCA - WAIVED): 6.8 % — ABNORMAL HIGH (ref 4.8–5.6)

## 2021-11-06 NOTE — Progress Notes (Signed)
BP 112/66   Pulse 68   Temp 98.8 F (37.1 C)   Ht _0  (1.803 m)   Wt 193 lb (87.5 kg)   SpO2 96%   BMI 26.92 kg/m    Subjective:   Patient ID: Tony Hodges, male    DOB: 05/31/1950, 71 y.o.   MRN: [id number]  HPI: Tony Hodges is a 71 y.o. male presenting on 11/06/2021 for Medical Management of Chronic Issues (3 month ) and Hypertension (Pt states this has been ranging the same , no concerns )   HPI Type 2 diabetes mellitus Patient comes in today for recheck of his diabetes. Patient has been currently taking Actos and Janumet. Patient is currently on an ACE inhibitor/ARB. Patient has seen an ophthalmologist this year. Patient denies any issues with their feet. The symptom started onset as an adult hypertension and hyperlipidemia ARE RELATED TO DM   Hypertension Patient is currently on amlodipine-benazepril, and their blood pressure today is 112/66. Patient denies any lightheadedness or dizziness. Patient denies headaches, blurred vision, chest pains, shortness of breath, or weakness. Denies any side effects from medication and is content with current medication.   Hyperlipidemia Patient is coming in for recheck of his hyperlipidemia. The patient is currently taking atorvastatin. They deny any issues with myalgias or history of liver damage from it. They deny any focal numbness or weakness or chest pain.   Patient's only real complaint is that he gets some numbness on the inside of his thumb that started on the left thumb and now has a little bit on the right thumb, is not completely numb but it just tingles a little bit.  It does not go anywhere else.  He denies any pain or irritation at the wrist on either side or his neck or shoulders  Relevant past medical, surgical, family and social history reviewed and updated as indicated. Interim medical history since our last visit reviewed. Allergies and medications reviewed and updated.  Review of Systems  Constitutional:   Negative for chills and fever.  Eyes:  Negative for visual disturbance.  Respiratory:  Negative for shortness of breath and wheezing.   Cardiovascular:  Negative for chest pain and leg swelling.  Musculoskeletal:  Negative for back pain and gait problem.  Skin:  Negative for rash.  Neurological:  Positive for numbness. Negative for weakness.  All other systems reviewed and are negative.   Per HPI unless specifically indicated above   Allergies as of 11/06/2021   No Known Allergies      Medication List        Accurate as of November 06, 2021  9:01 AM. If you have any questions, ask your nurse or doctor.          Accu-Chek Guide test strip Generic drug: glucose blood TEST TWO TIMES DAILY AS NEEDED. E08.69   Accu-Chek Guide w/Device Kit 1 each by Does not apply route 2 (two) times daily as needed.   amLODipine-benazepril 5-10 MG capsule Commonly known as: LOTREL Take 1 capsule by mouth daily.   aspirin EC 81 MG tablet Take 81 mg by mouth daily.   atorvastatin 20 MG tablet Commonly known as: LIPITOR Take 0.5 tablets (10 mg total) by mouth at bedtime.   diclofenac sodium 1 % Gel Commonly known as: VOLTAREN APPLY 2 GRAMS TO AFFECTED AREA 4 TIMES A DAY   Janumet 50-1000 MG tablet Generic drug: sitaGLIPtin-metformin Take 1 tablet by mouth 2 (two) times daily with a meal.  multivitamin capsule Take 1 capsule by mouth daily.   pioglitazone 30 MG tablet Commonly known as: ACTOS Take 1 tablet (30 mg total) by mouth daily.   tamsulosin 0.4 MG Caps capsule Commonly known as: FLOMAX Take 1 capsule (0.4 mg total) by mouth daily.         Objective:   BP 112/66   Pulse 68   Temp 98.8 F (37.1 C)   Ht _0  (1.803 m)   Wt 193 lb (87.5 kg)   SpO2 96%   BMI 26.92 kg/m   Wt Readings from Last 3 Encounters:  11/06/21 193 lb (87.5 kg)  08/10/21 192 lb (87.1 kg)  04/29/21 193 lb (87.5 kg)    Physical Exam Vitals and nursing note reviewed.   Constitutional:      General: He is not in acute distress.    Appearance: He is well-developed. He is not diaphoretic.  Eyes:     General: No scleral icterus.    Conjunctiva/sclera: Conjunctivae normal.  Neck:     Thyroid: No thyromegaly.  Cardiovascular:     Rate and Rhythm: Normal rate and regular rhythm.     Heart sounds: Normal heart sounds. No murmur heard. Pulmonary:     Effort: Pulmonary effort is normal. No respiratory distress.     Breath sounds: Normal breath sounds. No wheezing.  Musculoskeletal:        General: Normal range of motion.     Cervical back: Neck supple.  Lymphadenopathy:     Cervical: No cervical adenopathy.  Skin:    General: Skin is warm and dry.     Findings: No rash.  Neurological:     Mental Status: He is alert and oriented to person, place, and time.     Coordination: Coordination normal.  Psychiatric:        Behavior: Behavior normal.     Results for orders placed or performed in visit on 09/15/21  HM DIABETES EYE EXAM  Result Value Ref Range   HM Diabetic Eye Exam No Retinopathy No Retinopathy    Assessment & Plan:   Problem List Items Addressed This Visit       Cardiovascular and Mediastinum   HTN (hypertension)   Relevant Orders   Lipid panel   CMP14+EGFR   CBC with Differential/Platelet   Bayer DCA Hb A1c Waived   Microalbumin / creatinine urine ratio     Endocrine   Hyperlipidemia associated with type 2 diabetes mellitus ([place])   Relevant Orders   Lipid panel   CMP14+EGFR   CBC with Differential/Platelet   Bayer DCA Hb A1c Waived   Microalbumin / creatinine urine ratio   Diabetes mellitus ([place]) - Primary   Relevant Orders   Lipid panel   CMP14+EGFR   CBC with Differential/Platelet   Bayer DCA Hb A1c Waived   Microalbumin / creatinine urine ratio   Other Visit Diagnoses     Need for immunization against influenza       Relevant Orders   Flu Vaccine QUAD High Dose(Fluad) (Completed)       Continue current  medicine, no changes.  Seems to be doing well. Follow up plan: Return in about 3 months (around 02/06/2022), or if symptoms worsen or fail to improve, for Diabetes recheck.  Counseling provided for all of the vaccine components Orders Placed This Encounter  Procedures   Flu Vaccine QUAD High Dose(Fluad)   Lipid panel   CMP14+EGFR   CBC with Differential/Platelet   Bayer DCA Hb A1c  Waived   Microalbumin / creatinine urine ratio    Caryl Pina, MD [facility] 11/06/2021, 9:01 AM

## 2021-11-07 LAB — CMP14+EGFR
ALT: 13 IU/L (ref 0–44)
AST: 21 IU/L (ref 0–40)
Albumin/Globulin Ratio: 1.6 (ref 1.2–2.2)
Albumin: 4.5 g/dL (ref 3.9–4.9)
Alkaline Phosphatase: 60 IU/L (ref 44–121)
BUN/Creatinine Ratio: 14 (ref 10–24)
BUN: 21 mg/dL (ref 8–27)
Bilirubin Total: 0.4 mg/dL (ref 0.0–1.2)
CO2: 24 mmol/L (ref 20–29)
Calcium: 9.4 mg/dL (ref 8.6–10.2)
Chloride: 101 mmol/L (ref 96–106)
Creatinine, Ser: 1.49 mg/dL — ABNORMAL HIGH (ref 0.76–1.27)
Globulin, Total: 2.9 g/dL (ref 1.5–4.5)
Glucose: 116 mg/dL — ABNORMAL HIGH (ref 70–99)
Potassium: 4.5 mmol/L (ref 3.5–5.2)
Sodium: 139 mmol/L (ref 134–144)
Total Protein: 7.4 g/dL (ref 6.0–8.5)
eGFR: 50 mL/min/{1.73_m2} — ABNORMAL LOW (ref >59)

## 2021-11-07 LAB — CBC WITH DIFFERENTIAL/PLATELET
Basophils Absolute: 0 10*3/uL (ref 0.0–0.2)
Basos: 1 %
EOS (ABSOLUTE): 0.1 10*3/uL (ref 0.0–0.4)
Eos: 2 %
Hematocrit: 43.5 % (ref 37.5–51.0)
Hemoglobin: 13.3 g/dL (ref 13.0–17.7)
Immature Grans (Abs): 0 10*3/uL (ref 0.0–0.1)
Immature Granulocytes: 0 %
Lymphocytes Absolute: 1.6 10*3/uL (ref 0.7–3.1)
Lymphs: 31 %
MCH: 27.8 pg (ref 26.6–33.0)
MCHC: 30.6 g/dL — ABNORMAL LOW (ref 31.5–35.7)
MCV: 91 fL (ref 79–97)
Monocytes Absolute: 0.5 10*3/uL (ref 0.1–0.9)
Monocytes: 10 %
Neutrophils Absolute: 2.9 10*3/uL (ref 1.4–7.0)
Neutrophils: 56 %
Platelets: 232 10*3/uL (ref 150–450)
RBC: 4.78 x10E6/uL (ref 4.14–5.80)
RDW: 13.3 % (ref 11.6–15.4)
WBC: 5.2 10*3/uL (ref 3.4–10.8)

## 2021-11-07 LAB — LIPID PANEL
Chol/HDL Ratio: 2.2 ratio (ref 0.0–5.0)
Cholesterol, Total: 126 mg/dL (ref 100–199)
HDL: 57 mg/dL (ref >39)
LDL Chol Calc (NIH): 57 mg/dL (ref 0–99)
Triglycerides: 50 mg/dL (ref 0–149)
VLDL Cholesterol Cal: 12 mg/dL (ref 5–40)

## 2021-11-08 LAB — MICROALBUMIN / CREATININE URINE RATIO
Creatinine, Urine: 127.8 mg/dL
Microalb/Creat Ratio: <2 mg/g creat (ref 0–29)
Microalbumin, Urine: <3 ug/mL

## 2021-11-13 ENCOUNTER — Ambulatory Visit: Payer: Medicare PPO | Admitting: Family Medicine

## 2021-11-18 DIAGNOSIS — S134XXA Sprain of ligaments of cervical spine, initial encounter: Secondary | ICD-10-CM | POA: Diagnosis not present

## 2021-11-18 DIAGNOSIS — M9902 Segmental and somatic dysfunction of thoracic region: Secondary | ICD-10-CM | POA: Diagnosis not present

## 2021-11-18 DIAGNOSIS — M9903 Segmental and somatic dysfunction of lumbar region: Secondary | ICD-10-CM | POA: Diagnosis not present

## 2021-11-18 DIAGNOSIS — M9901 Segmental and somatic dysfunction of cervical region: Secondary | ICD-10-CM | POA: Diagnosis not present

## 2021-11-18 DIAGNOSIS — S338XXA Sprain of other parts of lumbar spine and pelvis, initial encounter: Secondary | ICD-10-CM | POA: Diagnosis not present

## 2021-11-18 DIAGNOSIS — S233XXA Sprain of ligaments of thoracic spine, initial encounter: Secondary | ICD-10-CM | POA: Diagnosis not present

## 2021-12-07 DIAGNOSIS — C4441 Basal cell carcinoma of skin of scalp and neck: Secondary | ICD-10-CM | POA: Diagnosis not present

## 2021-12-16 DIAGNOSIS — S233XXA Sprain of ligaments of thoracic spine, initial encounter: Secondary | ICD-10-CM | POA: Diagnosis not present

## 2021-12-16 DIAGNOSIS — M9901 Segmental and somatic dysfunction of cervical region: Secondary | ICD-10-CM | POA: Diagnosis not present

## 2021-12-16 DIAGNOSIS — M9902 Segmental and somatic dysfunction of thoracic region: Secondary | ICD-10-CM | POA: Diagnosis not present

## 2021-12-16 DIAGNOSIS — S134XXA Sprain of ligaments of cervical spine, initial encounter: Secondary | ICD-10-CM | POA: Diagnosis not present

## 2021-12-16 DIAGNOSIS — S338XXA Sprain of other parts of lumbar spine and pelvis, initial encounter: Secondary | ICD-10-CM | POA: Diagnosis not present

## 2021-12-16 DIAGNOSIS — M9903 Segmental and somatic dysfunction of lumbar region: Secondary | ICD-10-CM | POA: Diagnosis not present

## 2022-01-21 ENCOUNTER — Other Ambulatory Visit: Payer: Self-pay | Admitting: Family Medicine

## 2022-02-04 ENCOUNTER — Other Ambulatory Visit: Payer: Self-pay | Admitting: Family Medicine

## 2022-02-04 DIAGNOSIS — E0869 Diabetes mellitus due to underlying condition with other specified complication: Secondary | ICD-10-CM

## 2022-02-04 DIAGNOSIS — I10 Essential (primary) hypertension: Secondary | ICD-10-CM

## 2022-02-11 ENCOUNTER — Ambulatory Visit: Payer: Medicare PPO | Admitting: Family Medicine

## 2022-02-11 ENCOUNTER — Encounter: Payer: Self-pay | Admitting: Family Medicine

## 2022-02-11 VITALS — BP 124/62 | HR 57 | Ht 71.0 in | Wt 193.0 lb

## 2022-02-11 DIAGNOSIS — E0869 Diabetes mellitus due to underlying condition with other specified complication: Secondary | ICD-10-CM

## 2022-02-11 DIAGNOSIS — I152 Hypertension secondary to endocrine disorders: Secondary | ICD-10-CM

## 2022-02-11 DIAGNOSIS — I10 Essential (primary) hypertension: Secondary | ICD-10-CM

## 2022-02-11 DIAGNOSIS — E1169 Type 2 diabetes mellitus with other specified complication: Secondary | ICD-10-CM

## 2022-02-11 DIAGNOSIS — R6889 Other general symptoms and signs: Secondary | ICD-10-CM | POA: Diagnosis not present

## 2022-02-11 DIAGNOSIS — E785 Hyperlipidemia, unspecified: Secondary | ICD-10-CM | POA: Diagnosis not present

## 2022-02-11 DIAGNOSIS — E1159 Type 2 diabetes mellitus with other circulatory complications: Secondary | ICD-10-CM | POA: Diagnosis not present

## 2022-02-11 LAB — CMP14+EGFR
ALT: 13 IU/L (ref 0–44)
AST: 20 IU/L (ref 0–40)
Albumin/Globulin Ratio: 1.7 (ref 1.2–2.2)
Albumin: 4.2 g/dL (ref 3.9–4.9)
Alkaline Phosphatase: 60 IU/L (ref 44–121)
BUN/Creatinine Ratio: 14 (ref 10–24)
BUN: 18 mg/dL (ref 8–27)
Bilirubin Total: 0.3 mg/dL (ref 0.0–1.2)
CO2: 22 mmol/L (ref 20–29)
Calcium: 9.3 mg/dL (ref 8.6–10.2)
Chloride: 102 mmol/L (ref 96–106)
Creatinine, Ser: 1.3 mg/dL — ABNORMAL HIGH (ref 0.76–1.27)
Globulin, Total: 2.5 g/dL (ref 1.5–4.5)
Glucose: 134 mg/dL — ABNORMAL HIGH (ref 70–99)
Potassium: 4.5 mmol/L (ref 3.5–5.2)
Sodium: 139 mmol/L (ref 134–144)
Total Protein: 6.7 g/dL (ref 6.0–8.5)
eGFR: 59 mL/min/{1.73_m2} — ABNORMAL LOW (ref >59)

## 2022-02-11 LAB — CBC WITH DIFFERENTIAL/PLATELET
Basophils Absolute: 0.1 10*3/uL (ref 0.0–0.2)
Basos: 1 %
EOS (ABSOLUTE): 0.1 10*3/uL (ref 0.0–0.4)
Eos: 3 %
Hematocrit: 40.8 % (ref 37.5–51.0)
Hemoglobin: 13.2 g/dL (ref 13.0–17.7)
Immature Grans (Abs): 0 10*3/uL (ref 0.0–0.1)
Immature Granulocytes: 0 %
Lymphocytes Absolute: 1.4 10*3/uL (ref 0.7–3.1)
Lymphs: 27 %
MCH: 28.6 pg (ref 26.6–33.0)
MCHC: 32.4 g/dL (ref 31.5–35.7)
MCV: 88 fL (ref 79–97)
Monocytes Absolute: 0.5 10*3/uL (ref 0.1–0.9)
Monocytes: 10 %
Neutrophils Absolute: 3.1 10*3/uL (ref 1.4–7.0)
Neutrophils: 59 %
Platelets: 253 10*3/uL (ref 150–450)
RBC: 4.62 x10E6/uL (ref 4.14–5.80)
RDW: 13.1 % (ref 11.6–15.4)
WBC: 5.1 10*3/uL (ref 3.4–10.8)

## 2022-02-11 LAB — LIPID PANEL
Chol/HDL Ratio: 2.5 ratio (ref 0.0–5.0)
Cholesterol, Total: 141 mg/dL (ref 100–199)
HDL: 57 mg/dL (ref >39)
LDL Chol Calc (NIH): 70 mg/dL (ref 0–99)
Triglycerides: 67 mg/dL (ref 0–149)
VLDL Cholesterol Cal: 14 mg/dL (ref 5–40)

## 2022-02-11 LAB — BAYER DCA HB A1C WAIVED: HB A1C (BAYER DCA - WAIVED): 7.7 % — ABNORMAL HIGH (ref 4.8–5.6)

## 2022-02-11 MED ORDER — JANUMET 50-1000 MG PO TABS: 1.0000 | ORAL_TABLET | Freq: Two times a day (BID) | ORAL | 3 refills | Status: DC

## 2022-02-11 MED ORDER — PIOGLITAZONE HCL 30 MG PO TABS: 30.0000 mg | ORAL_TABLET | Freq: Every day | ORAL | 3 refills | Status: DC

## 2022-02-11 MED ORDER — ATORVASTATIN CALCIUM 20 MG PO TABS: 10.0000 mg | ORAL_TABLET | Freq: Every day | ORAL | 3 refills | Status: DC

## 2022-02-11 MED ORDER — AMLODIPINE BESY-BENAZEPRIL HCL 5-10 MG PO CAPS: 1.0000 | ORAL_CAPSULE | Freq: Every day | ORAL | 3 refills | Status: DC

## 2022-02-11 NOTE — Progress Notes (Signed)
BP 124/62   Pulse (!) 57   Ht '5\' 11"'$  (1.803 m)   Wt 193 lb (87.5 kg)   SpO2 97%   BMI 26.92 kg/m    Subjective:   Patient ID: Tony Hodges, male    DOB: 1950-10-16, 72 y.o.   MRN: [id number]  HPI: Tony Hodges is a 72 y.o. male presenting on 02/11/2022 for Medical Management of Chronic Issues, Diabetes, Hyperlipidemia, and Hypertension   HPI Type 2 diabetes mellitus Patient comes in today for recheck of his diabetes. Patient has been currently taking Actos and Janumet. Patient is currently on an ACE inhibitor/ARB. Patient has not seen an ophthalmologist this year. Patient denies any issues with their feet. The symptom started onset as an adult hypertension and hyperlipidemia ARE RELATED TO DM   Hypertension Patient is currently on amlodipine-benazepril, and their blood pressure today is 124/62. Patient denies any lightheadedness or dizziness. Patient denies headaches, blurred vision, chest pains, shortness of breath, or weakness. Denies any side effects from medication and is content with current medication.   Hyperlipidemia Patient is coming in for recheck of his hyperlipidemia. The patient is currently taking atorvastatin. They deny any issues with myalgias or history of liver damage from it. They deny any focal numbness or weakness or chest pain.   Relevant past medical, surgical, family and social history reviewed and updated as indicated. Interim medical history since our last visit reviewed. Allergies and medications reviewed and updated.  Review of Systems  Constitutional:  Negative for chills and fever.  Eyes:  Negative for visual disturbance.  Respiratory:  Negative for shortness of breath and wheezing.   Cardiovascular:  Negative for chest pain and leg swelling.  Musculoskeletal:  Negative for back pain and gait problem.  Skin:  Negative for rash.  Neurological:  Negative for dizziness, weakness and light-headedness.  All other systems reviewed and are  negative.   Per HPI unless specifically indicated above   Allergies as of 02/11/2022   No Known Allergies      Medication List        Accurate as of February 11, 2022  9:28 AM. If you have any questions, ask your nurse or doctor.          Accu-Chek Guide test strip Generic drug: glucose blood TEST TWO TIMES DAILY AS NEEDED. E08.69   Accu-Chek Guide w/Device Kit 1 each by Does not apply route 2 (two) times daily as needed.   amLODipine-benazepril 5-10 MG capsule Commonly known as: LOTREL Take 1 capsule by mouth daily. What changed: how much to take Changed by: Worthy Rancher, MD   aspirin EC 81 MG tablet Take 81 mg by mouth daily.   atorvastatin 20 MG tablet Commonly known as: LIPITOR Take 0.5 tablets (10 mg total) by mouth at bedtime.   cyanocobalamin 500 MCG tablet Commonly known as: VITAMIN B12 Take 500 mcg by mouth daily.   diclofenac sodium 1 % Gel Commonly known as: VOLTAREN APPLY 2 GRAMS TO AFFECTED AREA 4 TIMES A DAY   Janumet 50-1000 MG tablet Generic drug: sitaGLIPtin-metformin Take 1 tablet by mouth 2 (two) times daily with a meal. What changed: See the new instructions. Changed by: Fransisca Kaufmann Jerid Catherman, MD   multivitamin capsule Take 1 capsule by mouth daily.   pioglitazone 30 MG tablet Commonly known as: ACTOS Take 1 tablet (30 mg total) by mouth daily.   tamsulosin 0.4 MG Caps capsule Commonly known as: FLOMAX Take 1 capsule (0.4 mg total) by  mouth daily.         Objective:   BP 124/62   Pulse (!) 57   Ht '5\' 11"'$  (1.803 m)   Wt 193 lb (87.5 kg)   SpO2 97%   BMI 26.92 kg/m   Wt Readings from Last 3 Encounters:  02/11/22 193 lb (87.5 kg)  11/06/21 193 lb (87.5 kg)  08/10/21 192 lb (87.1 kg)    Physical Exam Vitals and nursing note reviewed.  Constitutional:      General: He is not in acute distress.    Appearance: He is well-developed. He is not diaphoretic.  Eyes:     General: No scleral icterus.     Conjunctiva/sclera: Conjunctivae normal.  Neck:     Thyroid: No thyromegaly.  Cardiovascular:     Rate and Rhythm: Normal rate and regular rhythm.     Heart sounds: Normal heart sounds. No murmur heard. Pulmonary:     Effort: Pulmonary effort is normal. No respiratory distress.     Breath sounds: Normal breath sounds. No wheezing.  Musculoskeletal:        General: No swelling. Normal range of motion.     Cervical back: Neck supple.  Lymphadenopathy:     Cervical: No cervical adenopathy.  Skin:    General: Skin is warm and dry.     Findings: No rash.  Neurological:     Mental Status: He is alert and oriented to person, place, and time.     Coordination: Coordination normal.  Psychiatric:        Behavior: Behavior normal.       Assessment & Plan:   Problem List Items Addressed This Visit       Cardiovascular and Mediastinum   HTN (hypertension)   Relevant Medications   atorvastatin (LIPITOR) 20 MG tablet   amLODipine-benazepril (LOTREL) 5-10 MG capsule   Other Relevant Orders   Bayer DCA Hb A1c Waived   CBC with Differential/Platelet   CMP14+EGFR   Lipid panel     Endocrine   Hyperlipidemia associated with type 2 diabetes mellitus ([place]) - Primary   Relevant Medications   atorvastatin (LIPITOR) 20 MG tablet   amLODipine-benazepril (LOTREL) 5-10 MG capsule   pioglitazone (ACTOS) 30 MG tablet   sitaGLIPtin-metformin (JANUMET) 50-1000 MG tablet   Other Relevant Orders   Bayer DCA Hb A1c Waived   CBC with Differential/Platelet   CMP14+EGFR   Lipid panel   Diabetes mellitus ([facility])   Relevant Medications   atorvastatin (LIPITOR) 20 MG tablet   amLODipine-benazepril (LOTREL) 5-10 MG capsule   pioglitazone (ACTOS) 30 MG tablet   sitaGLIPtin-metformin (JANUMET) 50-1000 MG tablet   Other Relevant Orders   Bayer DCA Hb A1c Waived   CBC with Differential/Platelet   CMP14+EGFR   Lipid panel   Bayer DCA Hb A1c Waived    A1c slightly up at 7.7.  He is going to try  diet and lifestyle changes and be more active.  The holidays were in the past 3 months so that is part of it.  If still up at next visit then we may have to consider adding medicine. Follow up plan: Return in about 3 months (around 05/12/2022), or if symptoms worsen or fail to improve, for Diabetes recheck.  Counseling provided for all of the vaccine components Orders Placed This Encounter  Procedures   Bayer DCA Hb A1c Waived   CBC with Differential/Platelet   CMP14+EGFR   Lipid panel   Bayer DCA Hb A1c Waived  Caryl Pina, MD [facility] 02/11/2022, 9:28 AM

## 2022-05-13 ENCOUNTER — Encounter: Payer: Self-pay | Admitting: Family Medicine

## 2022-05-13 ENCOUNTER — Ambulatory Visit: Payer: Medicare PPO | Admitting: Family Medicine

## 2022-05-13 VITALS — BP 135/73 | HR 63 | Ht 71.0 in | Wt 193.0 lb

## 2022-05-13 DIAGNOSIS — I10 Essential (primary) hypertension: Secondary | ICD-10-CM

## 2022-05-13 DIAGNOSIS — E785 Hyperlipidemia, unspecified: Secondary | ICD-10-CM | POA: Diagnosis not present

## 2022-05-13 DIAGNOSIS — E0869 Diabetes mellitus due to underlying condition with other specified complication: Secondary | ICD-10-CM

## 2022-05-13 DIAGNOSIS — E1159 Type 2 diabetes mellitus with other circulatory complications: Secondary | ICD-10-CM

## 2022-05-13 DIAGNOSIS — E1169 Type 2 diabetes mellitus with other specified complication: Secondary | ICD-10-CM | POA: Diagnosis not present

## 2022-05-13 DIAGNOSIS — Z7984 Long term (current) use of oral hypoglycemic drugs: Secondary | ICD-10-CM | POA: Diagnosis not present

## 2022-05-13 DIAGNOSIS — I152 Hypertension secondary to endocrine disorders: Secondary | ICD-10-CM

## 2022-05-13 DIAGNOSIS — R3912 Poor urinary stream: Secondary | ICD-10-CM

## 2022-05-13 DIAGNOSIS — N401 Enlarged prostate with lower urinary tract symptoms: Secondary | ICD-10-CM | POA: Diagnosis not present

## 2022-05-13 DIAGNOSIS — E119 Type 2 diabetes mellitus without complications: Secondary | ICD-10-CM | POA: Diagnosis not present

## 2022-05-13 LAB — BAYER DCA HB A1C WAIVED: HB A1C (BAYER DCA - WAIVED): 7.2 % — ABNORMAL HIGH (ref 4.8–5.6)

## 2022-05-13 MED ORDER — TAMSULOSIN HCL 0.4 MG PO CAPS: 0.4000 mg | ORAL_CAPSULE | Freq: Every day | ORAL | 3 refills | Status: DC

## 2022-05-13 NOTE — Progress Notes (Signed)
BP 135/73   Pulse 63   Ht 5\' 11"  (1.803 m)   Wt 193 lb (87.5 kg)   SpO2 98%   BMI 26.92 kg/m    Subjective:   Patient ID: Tony Hodges, male    DOB: 08/03/1950, 72 y.o.   MRN: [id number]  HPI: Tony Hodges is a 72 y.o. male presenting on 05/13/2022 for Medical Management of Chronic Issues, Diabetes, Hyperlipidemia, and Hypertension   HPI Type 2 diabetes mellitus Patient comes in today for recheck of his diabetes. Patient has been currently taking Actos and Janumet. Patient is currently on an ACE inhibitor/ARB. Patient has not seen an ophthalmologist this year. Patient denies any new issues with their feet. The symptom started onset as an adult hypertension and hyperlipidemia ARE RELATED TO DM   Hypertension Patient is currently on amlodipine-benazepril, and their blood pressure today is 135/73. Patient denies any lightheadedness or dizziness. Patient denies headaches, blurred vision, chest pains, shortness of breath, or weakness. Denies any side effects from medication and is content with current medication.   Hyperlipidemia Patient is coming in for recheck of his hyperlipidemia. The patient is currently taking atorvastatin. They deny any issues with myalgias or history of liver damage from it. They deny any focal numbness or weakness or chest pain.   BPH Patient is coming in for recheck on BPH Symptoms: None currently Medication: Flomax Last PSA: Year ago  Relevant past medical, surgical, family and social history reviewed and updated as indicated. Interim medical history since our last visit reviewed. Allergies and medications reviewed and updated.  Review of Systems  Constitutional:  Negative for chills and fever.  Respiratory:  Negative for shortness of breath and wheezing.   Cardiovascular:  Negative for chest pain and leg swelling.  Musculoskeletal:  Negative for back pain and gait problem.  Skin:  Negative for rash.  Neurological:  Negative for dizziness,  weakness and numbness.  All other systems reviewed and are negative.   Per HPI unless specifically indicated above   Allergies as of 05/13/2022   No Known Allergies      Medication List        Accurate as of May 13, 2022  8:58 AM. If you have any questions, ask your nurse or doctor.          Accu-Chek Guide test strip Generic drug: glucose blood TEST TWO TIMES DAILY AS NEEDED. E08.69   Accu-Chek Guide w/Device Kit 1 each by Does not apply route 2 (two) times daily as needed.   amLODipine-benazepril 5-10 MG capsule Commonly known as: LOTREL Take 1 capsule by mouth daily.   aspirin EC 81 MG tablet Take 81 mg by mouth daily.   atorvastatin 20 MG tablet Commonly known as: LIPITOR Take 0.5 tablets (10 mg total) by mouth at bedtime.   cyanocobalamin 500 MCG tablet Commonly known as: VITAMIN B12 Take 500 mcg by mouth daily.   diclofenac sodium 1 % Gel Commonly known as: VOLTAREN APPLY 2 GRAMS TO AFFECTED AREA 4 TIMES A DAY   Janumet 50-1000 MG tablet Generic drug: sitaGLIPtin-metformin Take 1 tablet by mouth 2 (two) times daily with a meal.   multivitamin capsule Take 1 capsule by mouth daily.   pioglitazone 30 MG tablet Commonly known as: ACTOS Take 1 tablet (30 mg total) by mouth daily.   tamsulosin 0.4 MG Caps capsule Commonly known as: FLOMAX Take 1 capsule (0.4 mg total) by mouth daily.         Objective:  BP 135/73   Pulse 63   Ht 5\' 11"  (1.803 m)   Wt 193 lb (87.5 kg)   SpO2 98%   BMI 26.92 kg/m   Wt Readings from Last 3 Encounters:  05/13/22 193 lb (87.5 kg)  02/11/22 193 lb (87.5 kg)  11/06/21 193 lb (87.5 kg)    Physical Exam Vitals and nursing note reviewed.  Constitutional:      General: He is not in acute distress.    Appearance: He is well-developed. He is not diaphoretic.  Eyes:     General: No scleral icterus.    Conjunctiva/sclera: Conjunctivae normal.  Neck:     Thyroid: No thyromegaly.  Cardiovascular:     Rate and  Rhythm: Normal rate and regular rhythm.     Heart sounds: Normal heart sounds. No murmur heard. Pulmonary:     Effort: Pulmonary effort is normal. No respiratory distress.     Breath sounds: Normal breath sounds. No wheezing.  Musculoskeletal:        General: No swelling. Normal range of motion.     Cervical back: Neck supple.  Lymphadenopathy:     Cervical: No cervical adenopathy.  Skin:    General: Skin is warm and dry.     Findings: No rash.  Neurological:     Mental Status: He is alert and oriented to person, place, and time.     Coordination: Coordination normal.  Psychiatric:        Behavior: Behavior normal.       Assessment & Plan:   Problem List Items Addressed This Visit       Cardiovascular and Mediastinum   HTN (hypertension)     Endocrine   Hyperlipidemia associated with type 2 diabetes mellitus ([facility])   Diabetes mellitus ([facility]) - Primary   Relevant Orders   Bayer DCA Hb A1c Waived     Genitourinary   BPH (benign prostatic hyperplasia)   Relevant Medications   tamsulosin (FLOMAX) 0.4 MG CAPS capsule    A1c improved to 7.2, continue with diet.  Blood pressure looks good, continue to monitor. Follow up plan: Return in about 3 months (around 08/13/2022), or if symptoms worsen or fail to improve, for Diabetes and hypertension and physical and BPH.  Counseling provided for all of the vaccine components Orders Placed This Encounter  Procedures   Bayer DCA Hb A1c Waived    Arville Care, MD [facility] 05/13/2022, 8:58 AM

## 2022-05-13 NOTE — Patient Instructions (Signed)
[facility]- [phone] 

## 2022-05-31 ENCOUNTER — Encounter: Payer: Self-pay | Admitting: Internal Medicine

## 2022-06-22 ENCOUNTER — Ambulatory Visit: Payer: Medicare PPO

## 2022-06-22 VITALS — Ht 71.0 in | Wt 193.0 lb

## 2022-06-22 DIAGNOSIS — Z1211 Encounter for screening for malignant neoplasm of colon: Secondary | ICD-10-CM

## 2022-06-22 MED ORDER — NA SULFATE-K SULFATE-MG SULF 17.5-3.13-1.6 GM/177ML PO SOLN: 1.0000 | Freq: Once | ORAL | 0 refills | Status: AC

## 2022-06-22 NOTE — Progress Notes (Signed)
No egg or soy allergy known to patient  No issues known to pt with past sedation with any surgeries or procedures Patient denies ever being told they had issues or difficulty with intubation  No FH of Malignant Hyperthermia Pt is not on diet pills Pt is not on  home 02  Pt is not on blood thinners  Pt denies issues with constipation  No A fib or A flutter Have any cardiac testing pending--no  Level of assistance: independent   PV completed. Pt opted for instructions to be sent to him due to him just going thru this with his brother per pt he is familiar with the prep instructions. Pt advised to log onto mychart and read over his instructions and to call the office back if he has any questions.   Good rx coupon for [facility] provided.Pt instructed to use Singlecare.com or GoodRx for a price reduction on prep   Patient's chart reviewed by Cathlyn Parsons CNRA prior to previsit and patient appropriate for the [facility].  Previsit completed and red dot placed by patient's name on their procedure day (on provider's schedule).

## 2022-06-29 ENCOUNTER — Encounter: Payer: Self-pay | Admitting: Internal Medicine

## 2022-07-19 ENCOUNTER — Encounter: Payer: Self-pay | Admitting: Internal Medicine

## 2022-07-19 ENCOUNTER — Ambulatory Visit: Payer: Medicare PPO | Admitting: Internal Medicine

## 2022-07-19 VITALS — BP 137/70 | HR 60 | Temp 98.4°F | Resp 18 | Ht 71.0 in | Wt 193.0 lb

## 2022-07-19 DIAGNOSIS — Z1211 Encounter for screening for malignant neoplasm of colon: Secondary | ICD-10-CM | POA: Diagnosis not present

## 2022-07-19 DIAGNOSIS — E119 Type 2 diabetes mellitus without complications: Secondary | ICD-10-CM | POA: Diagnosis not present

## 2022-07-19 DIAGNOSIS — E785 Hyperlipidemia, unspecified: Secondary | ICD-10-CM | POA: Diagnosis not present

## 2022-07-19 DIAGNOSIS — I10 Essential (primary) hypertension: Secondary | ICD-10-CM | POA: Diagnosis not present

## 2022-07-19 MED ORDER — SODIUM CHLORIDE 0.9 % IV SOLN
500.0000 mL | Freq: Once | INTRAVENOUS | Status: DC
Start: 2022-07-19 — End: 2023-03-14

## 2022-07-19 NOTE — Progress Notes (Signed)
A/o x 3, gd SR's, VSS, report to RN

## 2022-07-19 NOTE — Progress Notes (Signed)
[facility] History and Physical   Primary Care Physician:  Dettinger, Elige Radon, MD   Reason for Procedure:   CRCA screening  Plan:    colonoscopy     HPI: Tony Hodges is a 72 y.o. male for screening exam  Normal colonoscopy 2014   Past Medical History:  Diagnosis Date   Diabetes mellitus    Erectile dysfunction    Hyperlipidemia    Hypertension     Past Surgical History:  Procedure Laterality Date   COLONOSCOPY  04/27/2012   Dr. Leone Payor   SHOULDER SURGERY Right    UMBILICAL HERNIA REPAIR  2010    Prior to Admission medications   Medication Sig Start Date End Date Taking? Authorizing Provider  ACCU-CHEK GUIDE test strip TEST TWO TIMES DAILY AS NEEDED. E08.69 01/21/22  Yes Dettinger, Elige Radon, MD  amLODipine-benazepril (LOTREL) 5-10 MG capsule Take 1 capsule by mouth daily. 02/11/22  Yes Dettinger, Elige Radon, MD  aspirin 81 MG EC tablet Take 81 mg by mouth daily.   Yes [provider]  atorvastatin (LIPITOR) 20 MG tablet Take 0.5 tablets (10 mg total) by mouth at bedtime. 02/11/22  Yes Dettinger, Elige Radon, MD  Blood Glucose Monitoring Suppl (ACCU-CHEK GUIDE) w/Device KIT 1 each by Does not apply route 2 (two) times daily as needed. 02/28/20  Yes Dettinger, Elige Radon, MD  pioglitazone (ACTOS) 30 MG tablet Take 1 tablet (30 mg total) by mouth daily. 02/11/22  Yes Dettinger, Elige Radon, MD  sitaGLIPtin-metformin (JANUMET) 50-1000 MG tablet Take 1 tablet by mouth 2 (two) times daily with a meal. 02/11/22  Yes Dettinger, Elige Radon, MD  tamsulosin (FLOMAX) 0.4 MG CAPS capsule Take 1 capsule (0.4 mg total) by mouth daily. 05/13/22  Yes Dettinger, Elige Radon, MD  cyanocobalamin (VITAMIN B12) 500 MCG tablet Take 500 mcg by mouth daily.    [provider]  diclofenac sodium (VOLTAREN) 1 % GEL APPLY 2 GRAMS TO AFFECTED AREA 4 TIMES A DAY 10/10/18   Dettinger, Elige Radon, MD  Multiple Vitamin (MULTIVITAMIN) capsule Take 1 capsule by mouth daily.    [provider]    Current Outpatient Medications  Medication Sig Dispense Refill   ACCU-CHEK GUIDE test strip TEST TWO TIMES DAILY AS NEEDED. E08.69 200 strip 3   amLODipine-benazepril (LOTREL) 5-10 MG capsule Take 1 capsule by mouth daily. 90 capsule 3   aspirin 81 MG EC tablet Take 81 mg by mouth daily.     atorvastatin (LIPITOR) 20 MG tablet Take 0.5 tablets (10 mg total) by mouth at bedtime. 45 tablet 3   Blood Glucose Monitoring Suppl (ACCU-CHEK GUIDE) w/Device KIT 1 each by Does not apply route 2 (two) times daily as needed. 1 kit 3   pioglitazone (ACTOS) 30 MG tablet Take 1 tablet (30 mg total) by mouth daily. 90 tablet 3   sitaGLIPtin-metformin (JANUMET) 50-1000 MG tablet Take 1 tablet by mouth 2 (two) times daily with a meal. 180 tablet 3   tamsulosin (FLOMAX) 0.4 MG CAPS capsule Take 1 capsule (0.4 mg total) by mouth daily. 90 capsule 3   cyanocobalamin (VITAMIN B12) 500 MCG tablet Take 500 mcg by mouth daily.     diclofenac sodium (VOLTAREN) 1 % GEL APPLY 2 GRAMS TO AFFECTED AREA 4 TIMES A DAY 100 g 1   Multiple Vitamin (MULTIVITAMIN) capsule Take 1 capsule by mouth daily.     Current Facility-Administered Medications  Medication Dose Route Frequency Provider Last Rate Last Admin   0.9 %  sodium chloride infusion  500 mL Intravenous Once Iva Boop, MD        Allergies as of 07/19/2022   (No Known Allergies)    Family History  Problem Relation Age of Onset   Heart attack Father 24   Colon cancer Neg Hx    Esophageal cancer Neg Hx    Rectal cancer Neg Hx    Stomach cancer Neg Hx    Colon polyps Neg Hx     Social History   Socioeconomic History   Marital status: Married    Spouse name: Not on file   Number of children: Not on file   Years of education: Not on file   Highest education level: Bachelor's degree (e.g., BA, AB, BS)  Occupational History   Not on file  Tobacco Use   Smoking status: Former    Types: Cigarettes    Quit date: 09/05/1977    Years since  quitting: 44.8   Smokeless tobacco: Never  Vaping Use   Vaping Use: Never used  Substance and Sexual Activity   Alcohol use: Yes    Alcohol/week: 2.0 standard drinks of alcohol    Types: 2 Glasses of wine per week   Drug use: No   Sexual activity: Not on file  Other Topics Concern   Not on file  Social History Narrative   Not on file   Social Determinants of Health   Financial Resource Strain: Low Risk  (05/11/2022)   Overall Financial Resource Strain (CARDIA)    Difficulty of Paying Living Expenses: Not hard at all  Food Insecurity: No Food Insecurity (05/11/2022)   Hunger Vital Sign    Worried About Running Out of Food in the Last Year: Never true    Ran Out of Food in the Last Year: Never true  Transportation Needs: No Transportation Needs (05/11/2022)   PRAPARE - Administrator, Civil Service (Medical): No    Lack of Transportation (Non-Medical): No  Physical Activity: Unknown (05/11/2022)   Exercise Vital Sign    Days of Exercise per Week: 0 days    Minutes of Exercise per Session: Not on file  Stress: No Stress Concern Present (05/11/2022)   [employer] of Occupational Health - Occupational Stress Questionnaire    Feeling of Stress : Not at all  Social Connections: Socially Integrated (05/11/2022)   Social Connection and Isolation Panel [NHANES]    Frequency of Communication with Friends and Family: Three times a week    Frequency of Social Gatherings with Friends and Family: Three times a week    Attends Religious Services: More than 4 times per year    Active Member of Clubs or Organizations: Yes    Attends Engineer, structural: More than 4 times per year    Marital Status: Married  Catering manager Violence: Not on file    Review of Systems:  All other review of systems negative except as mentioned in the HPI.  Physical Exam: Vital signs BP (!) 140/67   Pulse 66   Temp 98.4 F (36.9 C)   Ht 5\' 11"  (1.803 m)   Wt 193 lb (87.5 kg)    SpO2 98%   BMI 26.92 kg/m   General:   Alert,  Well-developed, well-nourished, pleasant and cooperative in NAD Lungs:  Clear throughout to auscultation.   Heart:  Regular rate and rhythm; no murmurs, clicks, rubs,  or gallops. Abdomen:  Soft, nontender and nondistended. Normal bowel sounds.   Neuro/Psych:  Alert and cooperative. Normal mood and affect. A and O x 3   @Keyondra Lagrand  Sena Slate, MD, [facility] [phone] (pager) 07/19/2022 1:27 PM@

## 2022-07-19 NOTE — Patient Instructions (Addendum)
Please read handouts provided. Continue present medications. Resume previous diet.   YOU HAD AN ENDOSCOPIC PROCEDURE TODAY AT THE [facility]:   Refer to the procedure report that was given to you for any specific questions about what was found during the examination.  If the procedure report does not answer your questions, please call your gastroenterologist to clarify.  If you requested that your care partner not be given the details of your procedure findings, then the procedure report has been included in a sealed envelope for you to review at your convenience later.  YOU SHOULD EXPECT: Some feelings of bloating in the abdomen. Passage of more gas than usual.  Walking can help get rid of the air that was put into your GI tract during the procedure and reduce the bloating. If you had a lower endoscopy (such as a colonoscopy or flexible sigmoidoscopy) you may notice spotting of blood in your stool or on the toilet paper. If you underwent a bowel prep for your procedure, you may not have a normal bowel movement for a few days.  Please Note:  You might notice some irritation and congestion in your nose or some drainage.  This is from the oxygen used during your procedure.  There is no need for concern and it should clear up in a day or so.  SYMPTOMS TO REPORT IMMEDIATELY:  Following lower endoscopy (colonoscopy or flexible sigmoidoscopy):  Excessive amounts of blood in the stool  Significant tenderness or worsening of abdominal pains  Swelling of the abdomen that is new, acute  Fever of 100F or higher.  For urgent or emergent issues, a gastroenterologist can be reached at any hour by calling [phone]. Do not use MyChart messaging for urgent concerns.    DIET:  We do recommend a small meal at first, but then you may proceed to your regular diet.  Drink plenty of fluids but you should avoid alcoholic beverages for 24 hours.  ACTIVITY:  You should plan to take it easy for  the rest of today and you should NOT DRIVE or use heavy machinery until tomorrow (because of the sedation medicines used during the test).    FOLLOW UP: Our staff will call the number listed on your records the next business day following your procedure.  We will call around 7:15- 8:00 am to check on you and address any questions or concerns that you may have regarding the information given to you following your procedure. If we do not reach you, we will leave a message.     If any biopsies were taken you will be contacted by phone or by letter within the next 1-3 weeks.  Please call us at [phone] if you have not heard about the biopsies in 3 weeks.    SIGNATURES/CONFIDENTIALITY: You and/or your care partner have signed paperwork which will be entered into your electronic medical record.  These signatures attest to the fact that that the information above on your After Visit Summary has been reviewed and is understood.  Full responsibility of the confidentiality of this discharge information lies with you and/or your care-partner.No polyps or cancer seen.  You do have diverticulosis - thickened muscle rings and pouches in the colon wall. Please read the handout about this condition.  Given age and lack of polyps - no more routine colonoscopy needed.  I appreciate the opportunity to care for you. Iva Boop, MD, [facility]

## 2022-07-19 NOTE — Progress Notes (Signed)
Pt's states no medical or surgical changes since previsit or office visit. 

## 2022-07-19 NOTE — Op Note (Signed)
[facility] Patient Name: Tony Hodges Procedure Date: 07/19/2022 1:22 PM MRN: [id number] Endoscopist: Iva Boop , MD, [id number] Age: 72 Referring MD:  Date of Birth: [birth year] Gender: Male Account #: [id number] Procedure:                Colonoscopy Indications:              Screening for colorectal malignant neoplasm, Last                            colonoscopy: 2014 Medicines:                Monitored Anesthesia Care Procedure:                Pre-Anesthesia Assessment:                           - Prior to the procedure, a History and Physical                            was performed, and patient medications and                            allergies were reviewed. The patient's tolerance of                            previous anesthesia was also reviewed. The risks                            and benefits of the procedure and the sedation                            options and risks were discussed with the patient.                            All questions were answered, and informed consent                            was obtained. Prior Anticoagulants: The patient has                            taken no anticoagulant or antiplatelet agents. ASA                            Grade Assessment: II - A patient with mild systemic                            disease. After reviewing the risks and benefits,                            the patient was deemed in satisfactory condition to                            undergo the procedure.  After obtaining informed consent, the colonoscope                            was passed under direct vision. Throughout the                            procedure, the patient's blood pressure, pulse, and                            oxygen saturations were monitored continuously. The                            Olympus CF-HQ190L ([id number]) Colonoscope was                            introduced through the anus and advanced to the  the                            cecum, identified by appendiceal orifice and                            ileocecal valve. The colonoscopy was performed                            without difficulty. The patient tolerated the                            procedure well. The quality of the bowel                            preparation was good. The ileocecal valve,                            appendiceal orifice, and rectum were photographed. Scope In: 1:39:19 PM Scope Out: 1:51:16 PM Scope Withdrawal Time: 0 hours 8 minutes 20 seconds  Total Procedure Duration: 0 hours 11 minutes 57 seconds  Findings:                 The perianal and digital rectal examinations were                            normal.                           Multiple diverticula were found in the sigmoid                            colon.                           The exam was otherwise without abnormality on                            direct and retroflexion views. Complications:            No immediate complications. Estimated Blood Loss:  Estimated blood loss: none. Impression:               - Diverticulosis in the sigmoid colon.                           - The examination was otherwise normal on direct                            and retroflexion views.                           - No specimens collected. Recommendation:           - Patient has a contact number available for                            emergencies. The signs and symptoms of potential                            delayed complications were discussed with the                            patient. Return to normal activities tomorrow.                            Written discharge instructions were provided to the                            patient.                           - Resume previous diet.                           - Continue present medications.                           - No repeat colonoscopy due to current age (33                            years or  older) and the absence of colonic polyps. Iva Boop, MD 07/19/2022 1:55:13 PM This report has been signed electronically.

## 2022-07-20 ENCOUNTER — Telehealth: Payer: Self-pay | Admitting: *Deleted

## 2022-07-20 NOTE — Telephone Encounter (Signed)
Left message on f/u call 

## 2022-08-20 ENCOUNTER — Ambulatory Visit (INDEPENDENT_AMBULATORY_CARE_PROVIDER_SITE_OTHER): Payer: Medicare PPO | Admitting: Family Medicine

## 2022-08-20 ENCOUNTER — Encounter: Payer: Self-pay | Admitting: Family Medicine

## 2022-08-20 VITALS — BP 124/59 | HR 60 | Ht 71.0 in | Wt 193.0 lb

## 2022-08-20 DIAGNOSIS — E1159 Type 2 diabetes mellitus with other circulatory complications: Secondary | ICD-10-CM

## 2022-08-20 DIAGNOSIS — Z7984 Long term (current) use of oral hypoglycemic drugs: Secondary | ICD-10-CM

## 2022-08-20 DIAGNOSIS — E0869 Diabetes mellitus due to underlying condition with other specified complication: Secondary | ICD-10-CM

## 2022-08-20 DIAGNOSIS — Z0000 Encounter for general adult medical examination without abnormal findings: Secondary | ICD-10-CM

## 2022-08-20 DIAGNOSIS — I152 Hypertension secondary to endocrine disorders: Secondary | ICD-10-CM

## 2022-08-20 DIAGNOSIS — E1169 Type 2 diabetes mellitus with other specified complication: Secondary | ICD-10-CM | POA: Diagnosis not present

## 2022-08-20 DIAGNOSIS — Z0001 Encounter for general adult medical examination with abnormal findings: Secondary | ICD-10-CM

## 2022-08-20 DIAGNOSIS — I10 Essential (primary) hypertension: Secondary | ICD-10-CM

## 2022-08-20 DIAGNOSIS — E785 Hyperlipidemia, unspecified: Secondary | ICD-10-CM

## 2022-08-20 DIAGNOSIS — Z136 Encounter for screening for cardiovascular disorders: Secondary | ICD-10-CM | POA: Diagnosis not present

## 2022-08-20 DIAGNOSIS — R6889 Other general symptoms and signs: Secondary | ICD-10-CM | POA: Diagnosis not present

## 2022-08-20 LAB — CBC WITH DIFFERENTIAL/PLATELET
Basophils Absolute: 0 10*3/uL (ref 0.0–0.2)
Basos: 1 %
EOS (ABSOLUTE): 0.1 10*3/uL (ref 0.0–0.4)
Eos: 2 %
Hematocrit: 35.9 % — ABNORMAL LOW (ref 37.5–51.0)
Hemoglobin: 11.4 g/dL — ABNORMAL LOW (ref 13.0–17.7)
Immature Grans (Abs): 0 10*3/uL (ref 0.0–0.1)
Immature Granulocytes: 0 %
Lymphocytes Absolute: 1.3 10*3/uL (ref 0.7–3.1)
Lymphs: 31 %
MCH: 27.4 pg (ref 26.6–33.0)
MCHC: 31.8 g/dL (ref 31.5–35.7)
MCV: 86 fL (ref 79–97)
Monocytes Absolute: 0.4 10*3/uL (ref 0.1–0.9)
Monocytes: 10 %
Neutrophils Absolute: 2.3 10*3/uL (ref 1.4–7.0)
Neutrophils: 56 %
Platelets: 248 10*3/uL (ref 150–450)
RBC: 4.16 x10E6/uL (ref 4.14–5.80)
RDW: 13.4 % (ref 11.6–15.4)
WBC: 4.2 10*3/uL (ref 3.4–10.8)

## 2022-08-20 LAB — CMP14+EGFR
AST: 18 IU/L (ref 0–40)
Calcium: 9.3 mg/dL (ref 8.6–10.2)
Chloride: 102 mmol/L (ref 96–106)
Potassium: 4.7 mmol/L (ref 3.5–5.2)
Sodium: 140 mmol/L (ref 134–144)

## 2022-08-20 LAB — LIPID PANEL

## 2022-08-20 LAB — BAYER DCA HB A1C WAIVED: HB A1C (BAYER DCA - WAIVED): 6.9 % — ABNORMAL HIGH (ref 4.8–5.6)

## 2022-08-20 NOTE — Progress Notes (Signed)
BP (!) 124/59   Pulse 60   Ht 5\' 11"  (1.803 m)   Wt 193 lb (87.5 kg)   SpO2 99%   BMI 26.92 kg/m    Subjective:   Patient ID: Tony Hodges, male    DOB: [birth year], 72 y.o.   MRN: [id number]  HPI: Tony Hodges is a 72 y.o. male presenting on 08/20/2022 for Medical Management of Chronic Issues and Diabetes   HPI Type 2 diabetes mellitus Patient comes in today for recheck of his diabetes. Patient has been currently taking Janumet and Actos. Patient is currently on an ACE inhibitor/ARB. Patient has not seen an ophthalmologist this year. Patient denies any new issues with their feet. The symptom started onset as an adult hypertension and hyperlipidemia ARE RELATED TO DM   Hypertension Patient is currently on amlodipine-benazepril, and their blood pressure today is 124/50. Patient denies any lightheadedness or dizziness. Patient denies headaches, blurred vision, chest pains, shortness of breath, or weakness. Denies any side effects from medication and is content with current medication.   Hyperlipidemia Patient is coming in for recheck of his hyperlipidemia. The patient is currently taking atorvastatin. They deny any issues with myalgias or history of liver damage from it. They deny any focal numbness or weakness or chest pain.   Relevant past medical, surgical, family and social history reviewed and updated as indicated. Interim medical history since our last visit reviewed. Allergies and medications reviewed and updated.  Review of Systems  Constitutional:  Negative for chills and fever.  Eyes:  Negative for visual disturbance.  Respiratory:  Negative for shortness of breath and wheezing.   Cardiovascular:  Negative for chest pain and leg swelling.  Musculoskeletal:  Negative for back pain and gait problem.  Skin:  Negative for rash.  Neurological:  Negative for dizziness, weakness and light-headedness.  All other systems reviewed and are negative.   Per HPI unless  specifically indicated above   Allergies as of 08/20/2022   No Known Allergies      Medication List        Accurate as of August 20, 2022 10:03 AM. If you have any questions, ask your nurse or doctor.          Accu-Chek Guide test strip Generic drug: glucose blood TEST TWO TIMES DAILY AS NEEDED. E08.69   Accu-Chek Guide w/Device Kit 1 each by Does not apply route 2 (two) times daily as needed.   amLODipine-benazepril 5-10 MG capsule Commonly known as: LOTREL Take 1 capsule by mouth daily.   aspirin EC 81 MG tablet Take 81 mg by mouth daily.   atorvastatin 20 MG tablet Commonly known as: LIPITOR Take 0.5 tablets (10 mg total) by mouth at bedtime.   cyanocobalamin 500 MCG tablet Commonly known as: VITAMIN B12 Take 500 mcg by mouth daily.   diclofenac sodium 1 % Gel Commonly known as: VOLTAREN APPLY 2 GRAMS TO AFFECTED AREA 4 TIMES A DAY   Janumet 50-1000 MG tablet Generic drug: sitaGLIPtin-metformin Take 1 tablet by mouth 2 (two) times daily with a meal.   multivitamin capsule Take 1 capsule by mouth daily.   pioglitazone 30 MG tablet Commonly known as: ACTOS Take 1 tablet (30 mg total) by mouth daily.   tamsulosin 0.4 MG Caps capsule Commonly known as: FLOMAX Take 1 capsule (0.4 mg total) by mouth daily.         Objective:   BP (!) 124/59   Pulse 60   Ht 5\' 11"  (1.803  m)   Wt 193 lb (87.5 kg)   SpO2 99%   BMI 26.92 kg/m   Wt Readings from Last 3 Encounters:  08/20/22 193 lb (87.5 kg)  07/19/22 193 lb (87.5 kg)  06/22/22 193 lb (87.5 kg)    Physical Exam Vitals and nursing note reviewed.  Constitutional:      General: He is not in acute distress.    Appearance: He is well-developed. He is not diaphoretic.  HENT:     Right Ear: External ear normal.     Left Ear: External ear normal.     Nose: Nose normal.     Mouth/Throat:     Pharynx: No oropharyngeal exudate.  Eyes:     General: No scleral icterus.    Conjunctiva/sclera:  Conjunctivae normal.  Neck:     Thyroid: No thyromegaly.  Cardiovascular:     Rate and Rhythm: Normal rate and regular rhythm.     Heart sounds: Normal heart sounds. No murmur heard. Pulmonary:     Effort: Pulmonary effort is normal. No respiratory distress.     Breath sounds: Normal breath sounds. No wheezing.  Abdominal:     General: Bowel sounds are normal. There is no distension.     Palpations: Abdomen is soft.     Tenderness: There is no abdominal tenderness. There is no guarding or rebound.  Musculoskeletal:        General: Normal range of motion.     Cervical back: Neck supple.  Lymphadenopathy:     Cervical: No cervical adenopathy.  Skin:    General: Skin is warm and dry.     Findings: No rash.  Neurological:     Mental Status: He is alert and oriented to person, place, and time.     Coordination: Coordination normal.  Psychiatric:        Behavior: Behavior normal.       Assessment & Plan:   Problem List Items Addressed This Visit       Cardiovascular and Mediastinum   HTN (hypertension)     Endocrine   Hyperlipidemia associated with type 2 diabetes mellitus (HCC)   Relevant Orders   CBC with Differential/Platelet   CMP14+EGFR   Lipid panel   Bayer DCA Hb A1c Waived   Diabetes mellitus (HCC)   Relevant Orders   CBC with Differential/Platelet   CMP14+EGFR   Lipid panel   Bayer DCA Hb A1c Waived   Other Visit Diagnoses     Physical exam    -  Primary       A1c 6.9 much improved, continue with diet.  Check PSA next time.  Still has some nocturia but improved on Flomax Follow up plan: Return in about 3 months (around 11/20/2022), or if symptoms worsen or fail to improve, for Diabetes and hypertension recheck.  Counseling provided for all of the vaccine components Orders Placed This Encounter  Procedures   CBC with Differential/Platelet   CMP14+EGFR   Lipid panel   Bayer DCA Hb A1c Waived    Arville Care, MD [facility] 08/20/2022, 10:03 AM

## 2022-09-22 DIAGNOSIS — H5203 Hypermetropia, bilateral: Secondary | ICD-10-CM | POA: Diagnosis not present

## 2022-09-22 LAB — HM DIABETES EYE EXAM

## 2022-11-05 ENCOUNTER — Ambulatory Visit: Payer: Medicare PPO | Admitting: Family Medicine

## 2022-11-05 ENCOUNTER — Encounter: Payer: Self-pay | Admitting: Family Medicine

## 2022-11-05 VITALS — BP 136/66 | HR 59 | Ht 71.0 in | Wt 197.0 lb

## 2022-11-05 DIAGNOSIS — D649 Anemia, unspecified: Secondary | ICD-10-CM

## 2022-11-05 DIAGNOSIS — Z23 Encounter for immunization: Secondary | ICD-10-CM | POA: Diagnosis not present

## 2022-11-05 LAB — CBC WITH DIFFERENTIAL/PLATELET
Basophils Absolute: 0 10*3/uL (ref 0.0–0.2)
Basos: 1 %
EOS (ABSOLUTE): 0.1 10*3/uL (ref 0.0–0.4)
Eos: 2 %
Hematocrit: 38.4 % (ref 37.5–51.0)
Hemoglobin: 11.7 g/dL — ABNORMAL LOW (ref 13.0–17.7)
Immature Grans (Abs): 0 10*3/uL (ref 0.0–0.1)
Immature Granulocytes: 0 %
Lymphocytes Absolute: 1.2 10*3/uL (ref 0.7–3.1)
Lymphs: 29 %
MCH: 26.8 pg (ref 26.6–33.0)
MCHC: 30.5 g/dL — ABNORMAL LOW (ref 31.5–35.7)
MCV: 88 fL (ref 79–97)
Monocytes Absolute: 0.4 10*3/uL (ref 0.1–0.9)
Monocytes: 10 %
Neutrophils Absolute: 2.4 10*3/uL (ref 1.4–7.0)
Neutrophils: 58 %
Platelets: 239 10*3/uL (ref 150–450)
RBC: 4.37 x10E6/uL (ref 4.14–5.80)
RDW: 13.4 % (ref 11.6–15.4)
WBC: 4.2 10*3/uL (ref 3.4–10.8)

## 2022-11-05 NOTE — Progress Notes (Signed)
BP 136/66   Pulse (!) 59   Ht 5\' 11"  (1.803 m)   Wt 197 lb (89.4 kg)   SpO2 97%   BMI 27.48 kg/m    Subjective:   Patient ID: Tony Hodges, male    DOB: May 18, 1950, 72 y.o.   MRN: [id number]  HPI: Tony Hodges is a 72 y.o. male presenting on 11/05/2022 for Medical Management of Chronic Issues (44m follow up for low hemoglobin)   HPI Anemia recheck Patient is coming in today for anemia recheck.  He had mild anemia hemoglobin of 11.2 when he was seen last time a couple months ago.  He denies any blood in the stool.  He has not been getting blood recently.  He says he did go to try and get blood earlier this month but his blood counts are still down a little bit as well.  He denies any dark tarry stools.  He denies any indigestion or nausea or acid reflux.  He denies any lightheadedness dizziness or chest pains.  Relevant past medical, surgical, family and social history reviewed and updated as indicated. Interim medical history since our last visit reviewed. Allergies and medications reviewed and updated.  Review of Systems  Constitutional:  Negative for chills and fever.  Eyes:  Negative for visual disturbance.  Respiratory:  Negative for shortness of breath and wheezing.   Cardiovascular:  Negative for chest pain and leg swelling.  Gastrointestinal:  Negative for abdominal pain, blood in stool, constipation, diarrhea, nausea and vomiting.  Genitourinary:  Negative for hematuria.  Musculoskeletal:  Negative for back pain and gait problem.  Skin:  Negative for rash.  All other systems reviewed and are negative.   Per HPI unless specifically indicated above   Allergies as of 11/05/2022   No Known Allergies      Medication List        Accurate as of November 05, 2022 10:40 AM. If you have any questions, ask your nurse or doctor.          Accu-Chek Guide test strip Generic drug: glucose blood TEST TWO TIMES DAILY AS NEEDED. E08.69   Accu-Chek Guide w/Device  Kit 1 each by Does not apply route 2 (two) times daily as needed.   amLODipine-benazepril 5-10 MG capsule Commonly known as: LOTREL Take 1 capsule by mouth daily.   aspirin EC 81 MG tablet Take 81 mg by mouth daily.   atorvastatin 20 MG tablet Commonly known as: LIPITOR Take 0.5 tablets (10 mg total) by mouth at bedtime.   cyanocobalamin 500 MCG tablet Commonly known as: VITAMIN B12 Take 500 mcg by mouth daily.   diclofenac sodium 1 % Gel Commonly known as: VOLTAREN APPLY 2 GRAMS TO AFFECTED AREA 4 TIMES A DAY   Janumet 50-1000 MG tablet Generic drug: sitaGLIPtin-metformin Take 1 tablet by mouth 2 (two) times daily with a meal.   multivitamin capsule Take 1 capsule by mouth daily.   pioglitazone 30 MG tablet Commonly known as: ACTOS Take 1 tablet (30 mg total) by mouth daily.   tamsulosin 0.4 MG Caps capsule Commonly known as: FLOMAX Take 1 capsule (0.4 mg total) by mouth daily.         Objective:   BP 136/66   Pulse (!) 59   Ht 5\' 11"  (1.803 m)   Wt 197 lb (89.4 kg)   SpO2 97%   BMI 27.48 kg/m   Wt Readings from Last 3 Encounters:  11/05/22 197 lb (89.4 kg)  08/20/22  193 lb (87.5 kg)  07/19/22 193 lb (87.5 kg)    Physical Exam Vitals and nursing note reviewed.  Constitutional:      General: He is not in acute distress.    Appearance: He is well-developed. He is not diaphoretic.  Eyes:     General: No scleral icterus.    Conjunctiva/sclera: Conjunctivae normal.  Neck:     Thyroid: No thyromegaly.  Cardiovascular:     Rate and Rhythm: Normal rate and regular rhythm.     Heart sounds: Normal heart sounds. No murmur heard. Pulmonary:     Effort: Pulmonary effort is normal. No respiratory distress.     Breath sounds: Normal breath sounds. No wheezing.  Abdominal:     General: Abdomen is flat. Bowel sounds are normal. There is no distension.     Tenderness: There is no abdominal tenderness. There is no right CVA tenderness, left CVA tenderness,  guarding or rebound.  Musculoskeletal:        General: Normal range of motion.     Cervical back: Neck supple.  Lymphadenopathy:     Cervical: No cervical adenopathy.  Skin:    General: Skin is warm and dry.     Findings: No rash.  Neurological:     Mental Status: He is alert and oriented to person, place, and time.     Coordination: Coordination normal.  Psychiatric:        Behavior: Behavior normal.      Assessment & Plan:   Problem List Items Addressed This Visit       Other   Anemia   Other Visit Diagnoses     Low hemoglobin    -  Primary   Relevant Orders   CBC with Differential/Platelet       Recheck hb and blood counts today.  Watch for any signs of blood in stool. Follow up plan: Return if symptoms worsen or fail to improve.  Counseling provided for all of the vaccine components Orders Placed This Encounter  Procedures   CBC with Differential/Platelet    Arville Care, MD [facility] 11/05/2022, 10:40 AM

## 2022-11-08 ENCOUNTER — Other Ambulatory Visit: Payer: Self-pay

## 2022-11-08 MED ORDER — IRON (FERROUS SULFATE) 325 (65 FE) MG PO TABS: 325.0000 mg | ORAL_TABLET | Freq: Every day | ORAL | 1 refills | Status: DC

## 2022-11-10 DIAGNOSIS — I781 Nevus, non-neoplastic: Secondary | ICD-10-CM | POA: Diagnosis not present

## 2022-11-10 DIAGNOSIS — L820 Inflamed seborrheic keratosis: Secondary | ICD-10-CM | POA: Diagnosis not present

## 2022-11-10 DIAGNOSIS — Z85828 Personal history of other malignant neoplasm of skin: Secondary | ICD-10-CM | POA: Diagnosis not present

## 2022-11-10 DIAGNOSIS — L814 Other melanin hyperpigmentation: Secondary | ICD-10-CM | POA: Diagnosis not present

## 2022-11-10 DIAGNOSIS — L910 Hypertrophic scar: Secondary | ICD-10-CM | POA: Diagnosis not present

## 2022-11-10 DIAGNOSIS — L821 Other seborrheic keratosis: Secondary | ICD-10-CM | POA: Diagnosis not present

## 2022-11-26 ENCOUNTER — Ambulatory Visit: Payer: Medicare PPO | Admitting: Family Medicine

## 2022-12-06 ENCOUNTER — Ambulatory Visit: Payer: Medicare PPO | Admitting: Family Medicine

## 2022-12-06 ENCOUNTER — Encounter: Payer: Self-pay | Admitting: Family Medicine

## 2022-12-06 VITALS — BP 107/64 | HR 74 | Ht 71.0 in | Wt 195.0 lb

## 2022-12-06 DIAGNOSIS — E1169 Type 2 diabetes mellitus with other specified complication: Secondary | ICD-10-CM

## 2022-12-06 DIAGNOSIS — E785 Hyperlipidemia, unspecified: Secondary | ICD-10-CM

## 2022-12-06 DIAGNOSIS — E0869 Diabetes mellitus due to underlying condition with other specified complication: Secondary | ICD-10-CM | POA: Diagnosis not present

## 2022-12-06 DIAGNOSIS — I10 Essential (primary) hypertension: Secondary | ICD-10-CM

## 2022-12-06 DIAGNOSIS — Z7984 Long term (current) use of oral hypoglycemic drugs: Secondary | ICD-10-CM | POA: Diagnosis not present

## 2022-12-06 DIAGNOSIS — D649 Anemia, unspecified: Secondary | ICD-10-CM

## 2022-12-06 LAB — BAYER DCA HB A1C WAIVED: HB A1C (BAYER DCA - WAIVED): 6.9 % — ABNORMAL HIGH (ref 4.8–5.6)

## 2022-12-06 NOTE — Progress Notes (Signed)
BP 107/64   Pulse 74   Ht 5\' 11"  (1.803 m)   Wt 195 lb (88.5 kg)   SpO2 96%   BMI 27.20 kg/m    Subjective:   Patient ID: Tony Hodges, male    DOB: [birth year], 72 y.o.   MRN: [id number]  HPI: Tony Hodges is a 72 y.o. male presenting on 12/06/2022 for Medical Management of Chronic Issues, Diabetes, and low hemoglobin   HPI Type 2 diabetes mellitus Patient comes in today for recheck of his diabetes. Patient has been currently taking Actos and Janumet. Patient is currently on an ACE inhibitor/ARB. Patient has seen an ophthalmologist this year. Patient denies any new issues with their feet. The symptom started onset as an adult hypertension and hyperlipidemia ARE RELATED TO DM   Hypertension Patient is currently on amlodipine-benazepril, and their blood pressure today is 107/64. Patient denies any lightheadedness or dizziness. Patient denies headaches, blurred vision, chest pains, shortness of breath, or weakness. Denies any side effects from medication and is content with current medication.   Hyperlipidemia Patient is coming in for recheck of his hyperlipidemia. The patient is currently taking atorvastatin. They deny any issues with myalgias or history of liver damage from it. They deny any focal numbness or weakness or chest pain.   Relevant past medical, surgical, family and social history reviewed and updated as indicated. Interim medical history since our last visit reviewed. Allergies and medications reviewed and updated.  Review of Systems  Constitutional:  Negative for chills and fever.  Eyes:  Negative for visual disturbance.  Respiratory:  Negative for shortness of breath and wheezing.   Cardiovascular:  Negative for chest pain and leg swelling.  Musculoskeletal:  Negative for back pain and gait problem.  Skin:  Negative for rash.  Neurological:  Negative for dizziness, weakness and light-headedness.  All other systems reviewed and are negative.   Per HPI  unless specifically indicated above   Allergies as of 12/06/2022   No Known Allergies      Medication List        Accurate as of December 06, 2022 10:09 AM. If you have any questions, ask your nurse or doctor.          Accu-Chek Guide test strip Generic drug: glucose blood TEST TWO TIMES DAILY AS NEEDED. E08.69   Accu-Chek Guide w/Device Kit 1 each by Does not apply route 2 (two) times daily as needed.   amLODipine-benazepril 5-10 MG capsule Commonly known as: LOTREL Take 1 capsule by mouth daily.   aspirin EC 81 MG tablet Take 81 mg by mouth daily.   atorvastatin 20 MG tablet Commonly known as: LIPITOR Take 0.5 tablets (10 mg total) by mouth at bedtime.   cyanocobalamin 500 MCG tablet Commonly known as: VITAMIN B12 Take 500 mcg by mouth daily.   diclofenac sodium 1 % Gel Commonly known as: VOLTAREN APPLY 2 GRAMS TO AFFECTED AREA 4 TIMES A DAY   Iron (Ferrous Sulfate) 325 (65 Fe) MG Tabs Take 325 mg by mouth daily.   Janumet 50-1000 MG tablet Generic drug: sitaGLIPtin-metformin Take 1 tablet by mouth 2 (two) times daily with a meal.   multivitamin capsule Take 1 capsule by mouth daily.   pioglitazone 30 MG tablet Commonly known as: ACTOS Take 1 tablet (30 mg total) by mouth daily.   tamsulosin 0.4 MG Caps capsule Commonly known as: FLOMAX Take 1 capsule (0.4 mg total) by mouth daily.  Objective:   BP 107/64   Pulse 74   Ht 5\' 11"  (1.803 m)   Wt 195 lb (88.5 kg)   SpO2 96%   BMI 27.20 kg/m   Wt Readings from Last 3 Encounters:  12/06/22 195 lb (88.5 kg)  11/05/22 197 lb (89.4 kg)  08/20/22 193 lb (87.5 kg)    Physical Exam Vitals and nursing note reviewed.  Constitutional:      General: He is not in acute distress.    Appearance: He is well-developed. He is not diaphoretic.  Eyes:     General: No scleral icterus.    Conjunctiva/sclera: Conjunctivae normal.  Neck:     Thyroid: No thyromegaly.  Cardiovascular:     Rate  and Rhythm: Normal rate and regular rhythm.     Heart sounds: Normal heart sounds. No murmur heard. Pulmonary:     Effort: Pulmonary effort is normal. No respiratory distress.     Breath sounds: Normal breath sounds. No wheezing.  Musculoskeletal:        General: No swelling. Normal range of motion.     Cervical back: Neck supple.  Lymphadenopathy:     Cervical: No cervical adenopathy.  Skin:    General: Skin is warm and dry.     Findings: No rash.  Neurological:     Mental Status: He is alert and oriented to person, place, and time.     Coordination: Coordination normal.  Psychiatric:        Behavior: Behavior normal.       Assessment & Plan:   Problem List Items Addressed This Visit       Cardiovascular and Mediastinum   HTN (hypertension)     Endocrine   Hyperlipidemia associated with type 2 diabetes mellitus (HCC)   Diabetes mellitus (HCC)   Relevant Orders   BMP8+EGFR   CBC with Differential/Platelet   Bayer DCA Hb A1c Waived   Microalbumin / creatinine urine ratio   Other Visit Diagnoses     Low hemoglobin    -  Primary   Relevant Orders   BMP8+EGFR   CBC with Differential/Platelet   Bayer DCA Hb A1c Waived       A1c 6.9, looks good, continue with diet and exercise. Seems to be doing well otherwise, denies any lightheadedness or dizziness despite blood pressure being on the lower side.  Trying to stay active and dietary control. Follow up plan: Return in about 3 months (around 03/08/2023), or if symptoms worsen or fail to improve, for Diabetes hypertension and cholesterol.  Counseling provided for all of the vaccine components Orders Placed This Encounter  Procedures   BMP8+EGFR   CBC with Differential/Platelet   Bayer DCA Hb A1c Waived   Microalbumin / creatinine urine ratio    Arville Care, MD [place] [facility] 12/06/2022, 10:09 AM

## 2022-12-07 LAB — CBC WITH DIFFERENTIAL/PLATELET
Basophils Absolute: 0.1 10*3/uL (ref 0.0–0.2)
Basos: 1 %
EOS (ABSOLUTE): 0.1 10*3/uL (ref 0.0–0.4)
Eos: 2 %
Hematocrit: 40.8 % (ref 37.5–51.0)
Hemoglobin: 12.5 g/dL — ABNORMAL LOW (ref 13.0–17.7)
Immature Grans (Abs): 0 10*3/uL (ref 0.0–0.1)
Immature Granulocytes: 0 %
Lymphocytes Absolute: 1.3 10*3/uL (ref 0.7–3.1)
Lymphs: 28 %
MCH: 27.1 pg (ref 26.6–33.0)
MCHC: 30.6 g/dL — ABNORMAL LOW (ref 31.5–35.7)
MCV: 88 fL (ref 79–97)
Monocytes Absolute: 0.4 10*3/uL (ref 0.1–0.9)
Monocytes: 9 %
Neutrophils Absolute: 2.9 10*3/uL (ref 1.4–7.0)
Neutrophils: 60 %
Platelets: 245 10*3/uL (ref 150–450)
RBC: 4.62 x10E6/uL (ref 4.14–5.80)
RDW: 14.9 % (ref 11.6–15.4)
WBC: 4.8 10*3/uL (ref 3.4–10.8)

## 2022-12-07 LAB — BMP8+EGFR
BUN/Creatinine Ratio: 19 (ref 10–24)
BUN: 25 mg/dL (ref 8–27)
CO2: 24 mmol/L (ref 20–29)
Calcium: 9.2 mg/dL (ref 8.6–10.2)
Chloride: 102 mmol/L (ref 96–106)
Creatinine, Ser: 1.32 mg/dL — ABNORMAL HIGH (ref 0.76–1.27)
Glucose: 134 mg/dL — ABNORMAL HIGH (ref 70–99)
Potassium: 4.8 mmol/L (ref 3.5–5.2)
Sodium: 140 mmol/L (ref 134–144)
eGFR: 58 mL/min/{1.73_m2} — ABNORMAL LOW (ref >59)

## 2022-12-07 LAB — MICROALBUMIN / CREATININE URINE RATIO
Creatinine, Urine: 98.6 mg/dL
Microalb/Creat Ratio: <3 mg/g{creat} (ref 0–29)
Microalbumin, Urine: <3 ug/mL

## 2023-01-25 ENCOUNTER — Other Ambulatory Visit: Payer: Self-pay | Admitting: Family Medicine

## 2023-02-04 ENCOUNTER — Telehealth: Payer: Self-pay | Admitting: Family Medicine

## 2023-02-04 NOTE — Telephone Encounter (Signed)
Copied from CRM [id number]. Topic: Clinical - Prescription Issue >> Feb 04, 2023  1:15 PM Fredrica W wrote: Reason for CRM: Patient called in states Iron, Ferrous Sulfate, 325 (65 Fe) MG TABS is not covered by his [facility] this year. Would like to know if there is an suggested alternative provider could send. Thank You

## 2023-02-04 NOTE — Telephone Encounter (Signed)
Informed pt that he will need to purchase Fe 325 OTC. Pt understood and has no further concerns.

## 2023-03-14 ENCOUNTER — Encounter: Payer: Self-pay | Admitting: Family Medicine

## 2023-03-14 ENCOUNTER — Ambulatory Visit: Payer: Medicare PPO | Admitting: Family Medicine

## 2023-03-14 VITALS — BP 106/58 | HR 70 | Ht 71.0 in | Wt 193.0 lb

## 2023-03-14 DIAGNOSIS — N401 Enlarged prostate with lower urinary tract symptoms: Secondary | ICD-10-CM

## 2023-03-14 DIAGNOSIS — I10 Essential (primary) hypertension: Secondary | ICD-10-CM | POA: Diagnosis not present

## 2023-03-14 DIAGNOSIS — E785 Hyperlipidemia, unspecified: Secondary | ICD-10-CM

## 2023-03-14 DIAGNOSIS — R3912 Poor urinary stream: Secondary | ICD-10-CM | POA: Diagnosis not present

## 2023-03-14 DIAGNOSIS — E1159 Type 2 diabetes mellitus with other circulatory complications: Secondary | ICD-10-CM | POA: Diagnosis not present

## 2023-03-14 DIAGNOSIS — Z7984 Long term (current) use of oral hypoglycemic drugs: Secondary | ICD-10-CM

## 2023-03-14 DIAGNOSIS — E0869 Diabetes mellitus due to underlying condition with other specified complication: Secondary | ICD-10-CM | POA: Diagnosis not present

## 2023-03-14 DIAGNOSIS — E1169 Type 2 diabetes mellitus with other specified complication: Secondary | ICD-10-CM | POA: Diagnosis not present

## 2023-03-14 LAB — BAYER DCA HB A1C WAIVED: HB A1C (BAYER DCA - WAIVED): 7 % — ABNORMAL HIGH (ref 4.8–5.6)

## 2023-03-14 MED ORDER — PIOGLITAZONE HCL 30 MG PO TABS: 30.0000 mg | ORAL_TABLET | Freq: Every day | ORAL | 3 refills | Status: DC

## 2023-03-14 MED ORDER — ATORVASTATIN CALCIUM 20 MG PO TABS: 10.0000 mg | ORAL_TABLET | Freq: Every day | ORAL | 3 refills | Status: DC

## 2023-03-14 MED ORDER — TAMSULOSIN HCL 0.4 MG PO CAPS: 0.4000 mg | ORAL_CAPSULE | Freq: Every day | ORAL | 3 refills | Status: DC

## 2023-03-14 MED ORDER — IRON (FERROUS SULFATE) 325 (65 FE) MG PO TABS: 325.0000 mg | ORAL_TABLET | Freq: Every day | ORAL | 3 refills | Status: DC

## 2023-03-14 MED ORDER — AMLODIPINE BESY-BENAZEPRIL HCL 5-10 MG PO CAPS: 1.0000 | ORAL_CAPSULE | Freq: Every day | ORAL | 3 refills | Status: DC

## 2023-03-14 MED ORDER — JANUMET 50-1000 MG PO TABS: 1.0000 | ORAL_TABLET | Freq: Two times a day (BID) | ORAL | 3 refills | Status: DC

## 2023-03-14 NOTE — Progress Notes (Signed)
 BP (!) 106/58   Pulse 70   Ht 5\' 11"  (1.803 m)   Wt 193 lb (87.5 kg)   SpO2 98%   BMI 26.92 kg/m    Subjective:   Patient ID: Tony Hodges, male    DOB: 1950/04/19, 73 y.o.   MRN: [id number]  HPI: Tony Hodges is a 73 y.o. male presenting on 03/14/2023 for Medical Management of Chronic Issues, Diabetes, Hyperlipidemia, and Hypertension   HPI Type 2 diabetes mellitus Patient comes in today for recheck of his diabetes. Patient has been currently taking Janumet and Actos. Patient is currently on an ACE inhibitor/ARB. Patient has not seen an ophthalmologist this year. Patient denies any new issues with their feet. The symptom started onset as an adult hypertension and hyperlipidemia ARE RELATED TO DM   Hypertension Patient is currently on amlodipine-benazepril, and their blood pressure today is 106/58. Patient denies any lightheadedness or dizziness. Patient denies headaches, blurred vision, chest pains, shortness of breath, or weakness. Denies any side effects from medication and is content with current medication.   Hyperlipidemia Patient is coming in for recheck of his hyperlipidemia. The patient is currently taking atorvastatin. They deny any issues with myalgias or history of liver damage from it. They deny any focal numbness or weakness or chest pain.   BPH Patient is coming in for recheck on BPH Symptoms: Urinary frequency especially at night Medication: Flomax Last PSA: 1 year ago  Relevant past medical, surgical, family and social history reviewed and updated as indicated. Interim medical history since our last visit reviewed. Allergies and medications reviewed and updated.  Review of Systems  Constitutional:  Negative for chills and fever.  Eyes:  Negative for visual disturbance.  Respiratory:  Negative for shortness of breath and wheezing.   Cardiovascular:  Negative for chest pain and leg swelling.  Musculoskeletal:  Negative for back pain and gait problem.   Skin:  Negative for rash.  Neurological:  Negative for dizziness, weakness and light-headedness.  All other systems reviewed and are negative.   Per HPI unless specifically indicated above   Allergies as of 03/14/2023   No Known Allergies      Medication List        Accurate as of March 14, 2023 10:44 AM. If you have any questions, ask your nurse or doctor.          Accu-Chek Guide Test test strip Generic drug: glucose blood TEST TWO TIMES DAILY AS NEEDED. E08.69   Accu-Chek Guide w/Device Kit 1 each by Does not apply route 2 (two) times daily as needed.   amLODipine-benazepril 5-10 MG capsule Commonly known as: LOTREL Take 1 capsule by mouth daily.   aspirin EC 81 MG tablet Take 81 mg by mouth daily.   atorvastatin 20 MG tablet Commonly known as: LIPITOR Take 0.5 tablets (10 mg total) by mouth at bedtime.   cyanocobalamin 500 MCG tablet Commonly known as: VITAMIN B12 Take 500 mcg by mouth daily.   diclofenac sodium 1 % Gel Commonly known as: VOLTAREN APPLY 2 GRAMS TO AFFECTED AREA 4 TIMES A DAY   Iron (Ferrous Sulfate) 325 (65 Fe) MG Tabs Take 325 mg by mouth daily.   Janumet 50-1000 MG tablet Generic drug: sitaGLIPtin-metformin Take 1 tablet by mouth 2 (two) times daily with a meal.   multivitamin capsule Take 1 capsule by mouth daily.   pioglitazone 30 MG tablet Commonly known as: ACTOS Take 1 tablet (30 mg total) by mouth daily.  tamsulosin 0.4 MG Caps capsule Commonly known as: FLOMAX Take 1 capsule (0.4 mg total) by mouth daily.         Objective:   BP (!) 106/58   Pulse 70   Ht 5\' 11"  (1.803 m)   Wt 193 lb (87.5 kg)   SpO2 98%   BMI 26.92 kg/m   Wt Readings from Last 3 Encounters:  03/14/23 193 lb (87.5 kg)  12/06/22 195 lb (88.5 kg)  11/05/22 197 lb (89.4 kg)    Physical Exam Vitals and nursing note reviewed.  Constitutional:      General: He is not in acute distress.    Appearance: He is well-developed. He is not  diaphoretic.  Eyes:     General: No scleral icterus.    Conjunctiva/sclera: Conjunctivae normal.  Neck:     Thyroid: No thyromegaly.  Cardiovascular:     Rate and Rhythm: Normal rate and regular rhythm.     Heart sounds: Normal heart sounds. No murmur heard. Pulmonary:     Effort: Pulmonary effort is normal. No respiratory distress.     Breath sounds: Normal breath sounds. No wheezing.  Abdominal:     General: Abdomen is flat. Bowel sounds are normal. There is no distension.     Palpations: Abdomen is soft.     Tenderness: There is no abdominal tenderness. There is no guarding or rebound.  Musculoskeletal:        General: Normal range of motion.     Cervical back: Neck supple.  Lymphadenopathy:     Cervical: No cervical adenopathy.  Skin:    General: Skin is warm and dry.     Findings: No rash.  Neurological:     Mental Status: He is alert and oriented to person, place, and time.     Coordination: Coordination normal.  Psychiatric:        Behavior: Behavior normal.       Assessment & Plan:   Problem List Items Addressed This Visit       Cardiovascular and Mediastinum   HTN (hypertension)   Relevant Medications   amLODipine-benazepril (LOTREL) 5-10 MG capsule   atorvastatin (LIPITOR) 20 MG tablet   Other Relevant Orders   CBC with Differential/Platelet   CMP14+EGFR   Lipid panel   Bayer DCA Hb A1c Waived     Endocrine   Hyperlipidemia associated with type 2 diabetes mellitus (HCC)   Relevant Medications   amLODipine-benazepril (LOTREL) 5-10 MG capsule   atorvastatin (LIPITOR) 20 MG tablet   pioglitazone (ACTOS) 30 MG tablet   sitaGLIPtin-metformin (JANUMET) 50-1000 MG tablet   Other Relevant Orders   CBC with Differential/Platelet   CMP14+EGFR   Lipid panel   Bayer DCA Hb A1c Waived   Diabetes mellitus (HCC) - Primary   Relevant Medications   amLODipine-benazepril (LOTREL) 5-10 MG capsule   atorvastatin (LIPITOR) 20 MG tablet   pioglitazone (ACTOS) 30  MG tablet   sitaGLIPtin-metformin (JANUMET) 50-1000 MG tablet   Other Relevant Orders   CBC with Differential/Platelet   CMP14+EGFR   Lipid panel   Bayer DCA Hb A1c Waived     Genitourinary   BPH (benign prostatic hyperplasia)   Relevant Medications   tamsulosin (FLOMAX) 0.4 MG CAPS capsule   Other Relevant Orders   PSA, total and free    A1c was 7.0 so right about the same that he was last time and he is going to focus on increased activity over the next few months so no changes in  medication.  Blood pressure lower but he says it has been running up sometimes at home as well and he is not lightheaded or dizzy, so no changes there. Follow up plan: Return in about 3 months (around 06/14/2023), or if symptoms worsen or fail to improve, for Diabetes recheck.  Counseling provided for all of the vaccine components Orders Placed This Encounter  Procedures   CBC with Differential/Platelet   CMP14+EGFR   Lipid panel   Bayer DCA Hb A1c Waived   PSA, total and free    Arville Care, MD [place] [facility] 03/14/2023, 10:44 AM

## 2023-03-15 LAB — CMP14+EGFR
ALT: 18 IU/L (ref 0–44)
AST: 25 IU/L (ref 0–40)
Albumin: 4.3 g/dL (ref 3.8–4.8)
Alkaline Phosphatase: 62 IU/L (ref 44–121)
BUN/Creatinine Ratio: 15 (ref 10–24)
BUN: 23 mg/dL (ref 8–27)
Bilirubin Total: 0.3 mg/dL (ref 0.0–1.2)
CO2: 24 mmol/L (ref 20–29)
Calcium: 9.5 mg/dL (ref 8.6–10.2)
Chloride: 102 mmol/L (ref 96–106)
Creatinine, Ser: 1.5 mg/dL — ABNORMAL HIGH (ref 0.76–1.27)
Globulin, Total: 2.6 g/dL (ref 1.5–4.5)
Glucose: 159 mg/dL — ABNORMAL HIGH (ref 70–99)
Potassium: 5.3 mmol/L — ABNORMAL HIGH (ref 3.5–5.2)
Sodium: 139 mmol/L (ref 134–144)
Total Protein: 6.9 g/dL (ref 6.0–8.5)
eGFR: 49 mL/min/{1.73_m2} — ABNORMAL LOW (ref >59)

## 2023-03-15 LAB — CBC WITH DIFFERENTIAL/PLATELET
Basophils Absolute: 0.1 10*3/uL (ref 0.0–0.2)
Basos: 1 %
EOS (ABSOLUTE): 0.1 10*3/uL (ref 0.0–0.4)
Eos: 2 %
Hematocrit: 43 % (ref 37.5–51.0)
Hemoglobin: 13.4 g/dL (ref 13.0–17.7)
Immature Grans (Abs): 0 10*3/uL (ref 0.0–0.1)
Immature Granulocytes: 0 %
Lymphocytes Absolute: 1.2 10*3/uL (ref 0.7–3.1)
Lymphs: 26 %
MCH: 28.5 pg (ref 26.6–33.0)
MCHC: 31.2 g/dL — ABNORMAL LOW (ref 31.5–35.7)
MCV: 91 fL (ref 79–97)
Monocytes Absolute: 0.4 10*3/uL (ref 0.1–0.9)
Monocytes: 8 %
Neutrophils Absolute: 3 10*3/uL (ref 1.4–7.0)
Neutrophils: 63 %
Platelets: 253 10*3/uL (ref 150–450)
RBC: 4.71 x10E6/uL (ref 4.14–5.80)
RDW: 13.7 % (ref 11.6–15.4)
WBC: 4.8 10*3/uL (ref 3.4–10.8)

## 2023-03-15 LAB — PSA, TOTAL AND FREE
PSA, Free Pct: 39.3 %
PSA, Free: 0.55 ng/mL
Prostate Specific Ag, Serum: 1.4 ng/mL (ref 0.0–4.0)

## 2023-03-15 LAB — LIPID PANEL
Chol/HDL Ratio: 2.5 ratio (ref 0.0–5.0)
Cholesterol, Total: 132 mg/dL (ref 100–199)
HDL: 52 mg/dL (ref >39)
LDL Chol Calc (NIH): 64 mg/dL (ref 0–99)
Triglycerides: 83 mg/dL (ref 0–149)
VLDL Cholesterol Cal: 16 mg/dL (ref 5–40)

## 2023-03-18 ENCOUNTER — Encounter: Payer: Self-pay | Admitting: Family Medicine

## 2023-04-24 ENCOUNTER — Other Ambulatory Visit: Payer: Self-pay | Admitting: Family Medicine

## 2023-04-24 DIAGNOSIS — E0869 Diabetes mellitus due to underlying condition with other specified complication: Secondary | ICD-10-CM

## 2023-06-16 ENCOUNTER — Encounter: Payer: Self-pay | Admitting: Family Medicine

## 2023-06-16 ENCOUNTER — Ambulatory Visit: Admitting: Family Medicine

## 2023-06-16 VITALS — BP 125/68 | HR 62 | Ht 71.0 in | Wt 192.0 lb

## 2023-06-16 DIAGNOSIS — I10 Essential (primary) hypertension: Secondary | ICD-10-CM | POA: Diagnosis not present

## 2023-06-16 DIAGNOSIS — R7309 Other abnormal glucose: Secondary | ICD-10-CM | POA: Diagnosis not present

## 2023-06-16 DIAGNOSIS — E785 Hyperlipidemia, unspecified: Secondary | ICD-10-CM | POA: Diagnosis not present

## 2023-06-16 DIAGNOSIS — Z7984 Long term (current) use of oral hypoglycemic drugs: Secondary | ICD-10-CM

## 2023-06-16 DIAGNOSIS — E1169 Type 2 diabetes mellitus with other specified complication: Secondary | ICD-10-CM

## 2023-06-16 DIAGNOSIS — E0869 Diabetes mellitus due to underlying condition with other specified complication: Secondary | ICD-10-CM

## 2023-06-16 DIAGNOSIS — R6889 Other general symptoms and signs: Secondary | ICD-10-CM | POA: Diagnosis not present

## 2023-06-16 LAB — BAYER DCA HB A1C WAIVED: HB A1C (BAYER DCA - WAIVED): 7 % — ABNORMAL HIGH (ref 4.8–5.6)

## 2023-06-16 MED ORDER — JANUMET 50-1000 MG PO TABS: 1.0000 | ORAL_TABLET | Freq: Two times a day (BID) | ORAL | 1 refills | Status: DC

## 2023-06-16 NOTE — Progress Notes (Signed)
 BP 125/68   Pulse 62   Ht 5\' 11"  (1.803 m)   Wt 192 lb (87.1 kg)   SpO2 97%   BMI 26.78 kg/m    Subjective:   Patient ID: Tony Hodges, male    DOB: October 28, 1950, 73 y.o.   MRN: [id number]  HPI: Tony Hodges is a 73 y.o. male presenting on 06/16/2023 for Medical Management of Chronic Issues, Diabetes, and Hyperlipidemia   HPI Type 2 diabetes mellitus Patient comes in today for recheck of his diabetes. Patient has been currently taking Janumet  and Actos . Patient is currently on an ACE inhibitor/ARB. Patient has seen an ophthalmologist this year. Patient denies any new issues with their feet. The symptom started onset as an adult hypertension and hyperlipidemia ARE RELATED TO DM   Hypertension Patient is currently on amlodipine -benazepril , and their blood pressure today is 125/60. Patient denies any lightheadedness or dizziness. Patient denies headaches, blurred vision, chest pains, shortness of breath, or weakness. Denies any side effects from medication and is content with current medication.    Hyperlipidemia Patient is coming in for recheck of his hyperlipidemia. The patient is currently taking atorvastatin . They deny any issues with myalgias or history of liver damage from it. They deny any focal numbness or weakness or chest pain.   Relevant past medical, surgical, family and social history reviewed and updated as indicated. Interim medical history since our last visit reviewed. Allergies and medications reviewed and updated.  Review of Systems  Constitutional:  Negative for chills and fever.  Eyes:  Negative for discharge.  Respiratory:  Negative for shortness of breath and wheezing.   Cardiovascular:  Negative for chest pain and leg swelling.  Musculoskeletal:  Negative for back pain and gait problem.  Skin:  Negative for rash.  Neurological:  Negative for dizziness and light-headedness.  All other systems reviewed and are negative.   Per HPI unless specifically  indicated above   Allergies as of 06/16/2023   No Known Allergies      Medication List        Accurate as of June 16, 2023  9:34 AM. If you have any questions, ask your nurse or doctor.          STOP taking these medications    Iron  (Ferrous Sulfate ) 325 (65 Fe) MG Tabs Stopped by: Melodie Spry A Amery Minasyan       TAKE these medications    Accu-Chek Guide Test test strip Generic drug: glucose blood TEST TWO TIMES DAILY AS NEEDED. E08.69   Accu-Chek Guide w/Device Kit 1 each by Does not apply route 2 (two) times daily as needed.   amLODipine -benazepril  5-10 MG capsule Commonly known as: LOTREL Take 1 capsule by mouth daily.   aspirin EC 81 MG tablet Take 81 mg by mouth daily.   atorvastatin  20 MG tablet Commonly known as: LIPITOR Take 0.5 tablets (10 mg total) by mouth at bedtime.   cyanocobalamin 500 MCG tablet Commonly known as: VITAMIN B12 Take 500 mcg by mouth daily.   diclofenac  sodium 1 % Gel Commonly known as: VOLTAREN  APPLY 2 GRAMS TO AFFECTED AREA 4 TIMES A DAY   Janumet  50-1000 MG tablet Generic drug: sitaGLIPtin -metformin  Take 1 tablet by mouth 2 (two) times daily with a meal. What changed: See the new instructions. Changed by: Lucio Sabin Merion Caton   multivitamin capsule Take 1 capsule by mouth daily.   pioglitazone  30 MG tablet Commonly known as: ACTOS  Take 1 tablet (30 mg total) by mouth daily.  tamsulosin  0.4 MG Caps capsule Commonly known as: FLOMAX  Take 1 capsule (0.4 mg total) by mouth daily.         Objective:   BP 125/68   Pulse 62   Ht 5\' 11"  (1.803 m)   Wt 192 lb (87.1 kg)   SpO2 97%   BMI 26.78 kg/m   Wt Readings from Last 3 Encounters:  06/16/23 192 lb (87.1 kg)  03/14/23 193 lb (87.5 kg)  12/06/22 195 lb (88.5 kg)    Physical Exam Vitals and nursing note reviewed.  Constitutional:      General: He is not in acute distress.    Appearance: He is well-developed. He is not diaphoretic.  Eyes:     General: No  scleral icterus.    Conjunctiva/sclera: Conjunctivae normal.  Neck:     Thyroid: No thyromegaly.  Cardiovascular:     Rate and Rhythm: Normal rate and regular rhythm.     Heart sounds: Normal heart sounds. No murmur heard. Pulmonary:     Effort: Pulmonary effort is normal. No respiratory distress.     Breath sounds: Normal breath sounds. No wheezing.  Musculoskeletal:        General: No swelling. Normal range of motion.     Cervical back: Neck supple.  Lymphadenopathy:     Cervical: No cervical adenopathy.  Skin:    General: Skin is warm and dry.     Findings: No rash.  Neurological:     Mental Status: He is alert and oriented to person, place, and time.     Coordination: Coordination normal.  Psychiatric:        Behavior: Behavior normal.       Assessment & Plan:   Problem List Items Addressed This Visit       Cardiovascular and Mediastinum   HTN (hypertension)     Endocrine   Hyperlipidemia associated with type 2 diabetes mellitus (HCC)   Relevant Medications   sitaGLIPtin -metformin  (JANUMET ) 50-1000 MG tablet   Diabetes mellitus (HCC) - Primary   Relevant Medications   sitaGLIPtin -metformin  (JANUMET ) 50-1000 MG tablet   Other Relevant Orders   BMP8+EGFR   Bayer DCA Hb A1c Waived    A1c 7.0, no change in medication  Blood pressure and everything else looks good today.  No changes Follow up plan: Return in about 3 months (around 09/16/2023), or if symptoms worsen or fail to improve, for Diabetes-cholesterol and hypertension.  Counseling provided for all of the vaccine components Orders Placed This Encounter  Procedures   BMP8+EGFR   Bayer DCA Hb A1c Waived    Jolyne Needs, MD [facility] 06/16/2023, 9:34 AM

## 2023-06-20 LAB — BMP8+EGFR
BUN/Creatinine Ratio: 16 (ref 10–24)
BUN: 23 mg/dL (ref 8–27)
CO2: 21 mmol/L (ref 20–29)
Calcium: 9.3 mg/dL (ref 8.6–10.2)
Chloride: 102 mmol/L (ref 96–106)
Creatinine, Ser: 1.48 mg/dL — ABNORMAL HIGH (ref 0.76–1.27)
Glucose: 136 mg/dL — ABNORMAL HIGH (ref 70–99)
Potassium: 4.8 mmol/L (ref 3.5–5.2)
Sodium: 139 mmol/L (ref 134–144)
eGFR: 50 mL/min/{1.73_m2} — ABNORMAL LOW (ref >59)

## 2023-06-24 ENCOUNTER — Ambulatory Visit: Payer: Self-pay | Admitting: Family Medicine

## 2023-07-13 ENCOUNTER — Encounter: Payer: Self-pay | Admitting: Family Medicine

## 2023-07-14 MED ORDER — AMLODIPINE BESY-BENAZEPRIL HCL 2.5-10 MG PO CAPS: 1.0000 | ORAL_CAPSULE | Freq: Every day | ORAL | 1 refills | Status: DC

## 2023-09-21 ENCOUNTER — Encounter: Payer: Self-pay | Admitting: Family Medicine

## 2023-09-21 ENCOUNTER — Ambulatory Visit: Admitting: Family Medicine

## 2023-09-21 VITALS — BP 129/67 | HR 63 | Temp 91.0°F | Ht 71.0 in | Wt 195.0 lb

## 2023-09-21 DIAGNOSIS — Z23 Encounter for immunization: Secondary | ICD-10-CM

## 2023-09-21 DIAGNOSIS — E785 Hyperlipidemia, unspecified: Secondary | ICD-10-CM | POA: Diagnosis not present

## 2023-09-21 DIAGNOSIS — E1169 Type 2 diabetes mellitus with other specified complication: Secondary | ICD-10-CM | POA: Diagnosis not present

## 2023-09-21 DIAGNOSIS — I10 Essential (primary) hypertension: Secondary | ICD-10-CM | POA: Diagnosis not present

## 2023-09-21 DIAGNOSIS — E0869 Diabetes mellitus due to underlying condition with other specified complication: Secondary | ICD-10-CM

## 2023-09-21 LAB — LIPID PANEL

## 2023-09-21 LAB — BAYER DCA HB A1C WAIVED: HB A1C (BAYER DCA - WAIVED): 7.2 % — ABNORMAL HIGH (ref 4.8–5.6)

## 2023-09-21 NOTE — Progress Notes (Signed)
 BP 129/67   Pulse 63   Temp (!) 91 F (32.8 C)   Ht 5' 11 (1.803 m)   Wt 195 lb (88.5 kg)   SpO2 96%   BMI 27.20 kg/m    Subjective:   Patient ID: Tony Hodges, male    DOB: 1950/05/22, 73 y.o.   MRN: [id number]  HPI: Tony Hodges is a 73 y.o. male presenting on 09/21/2023 for Medical Management of Chronic Issues, Diabetes, and Hyperlipidemia   Discussed the use of AI scribe software for clinical note transcription with the patient, who gave verbal consent to proceed.  History of Present Illness   Tony Hodges is a 73 year old male with diabetes and hypertension who presents for a recheck.  His blood glucose levels have been stable, with a reading of 105 this morning and previous readings ranging between 90 and 113. He continues to take Actos  and Janumet  without any issues. His hemoglobin A1c was noted to be 7.2, which is slightly elevated.  His blood pressure was recorded at 129/67 today. He experienced dizziness once, which resolved after his blood pressure medication was reduced. He is currently on a reduced dose of amlodipine  and benazepril  at 2.5/10 mg.  He is taking atorvastatin  for cholesterol management.  He mentions a previous incident where a heavy object fell on his toenail, causing a bruise, but it is healing well.          Relevant past medical, surgical, family and social history reviewed and updated as indicated. Interim medical history since our last visit reviewed. Allergies and medications reviewed and updated.  Review of Systems  Constitutional:  Negative for chills and fever.  Eyes:  Negative for visual disturbance.  Respiratory:  Negative for shortness of breath and wheezing.   Cardiovascular:  Negative for chest pain and leg swelling.  Musculoskeletal:  Negative for back pain and gait problem.  Skin:  Negative for rash.  Neurological:  Negative for dizziness and light-headedness.  All other systems reviewed and are negative.   Per HPI  unless specifically indicated above   Allergies as of 09/21/2023   No Known Allergies      Medication List        Accurate as of September 21, 2023  8:54 AM. If you have any questions, ask your nurse or doctor.          Accu-Chek Guide Test test strip Generic drug: glucose blood TEST TWO TIMES DAILY AS NEEDED. E08.69   Accu-Chek Guide w/Device Kit 1 each by Does not apply route 2 (two) times daily as needed.   amlodipine -benazepril  2.5-10 MG capsule Commonly known as: LOTREL Take 1 capsule by mouth daily.   aspirin EC 81 MG tablet Take 81 mg by mouth daily.   atorvastatin  20 MG tablet Commonly known as: LIPITOR Take 0.5 tablets (10 mg total) by mouth at bedtime.   cyanocobalamin 500 MCG tablet Commonly known as: VITAMIN B12 Take 500 mcg by mouth daily.   diclofenac  sodium 1 % Gel Commonly known as: VOLTAREN  APPLY 2 GRAMS TO AFFECTED AREA 4 TIMES A DAY   Janumet  50-1000 MG tablet Generic drug: sitaGLIPtin -metformin  Take 1 tablet by mouth 2 (two) times daily with a meal.   multivitamin capsule Take 1 capsule by mouth daily.   pioglitazone  30 MG tablet Commonly known as: ACTOS  Take 1 tablet (30 mg total) by mouth daily.   tamsulosin  0.4 MG Caps capsule Commonly known as: FLOMAX  Take 1 capsule (0.4 mg total)  by mouth daily.         Objective:   BP 129/67   Pulse 63   Temp (!) 91 F (32.8 C)   Ht 5' 11 (1.803 m)   Wt 195 lb (88.5 kg)   SpO2 96%   BMI 27.20 kg/m   Wt Readings from Last 3 Encounters:  09/21/23 195 lb (88.5 kg)  06/16/23 192 lb (87.1 kg)  03/14/23 193 lb (87.5 kg)    Physical Exam Physical Exam   VITALS: BP- 129/67 NECK: No cervical lymphadenopathy. CHEST: Lungs clear to auscultation bilaterally. CARDIOVASCULAR: Regular heart rate and rhythm, no murmurs. EXTREMITIES: Good sensation, no cuts or sores, good pulses in feet.         Assessment & Plan:   Problem List Items Addressed This Visit       Cardiovascular and  Mediastinum   HTN (hypertension)   Relevant Orders   Bayer DCA Hb A1c Waived   CBC with Differential/Platelet   CMP14+EGFR   Lipid panel     Endocrine   Hyperlipidemia associated with type 2 diabetes mellitus (HCC) - Primary   Relevant Orders   Bayer DCA Hb A1c Waived   CBC with Differential/Platelet   CMP14+EGFR   Lipid panel   Diabetes mellitus (HCC)   Other Visit Diagnoses       Encounter for immunization       Relevant Orders   Flu vaccine HIGH DOSE PF(Fluzone Trivalent) (Completed)          Type 2 diabetes mellitus with other specified complication Blood glucose generally well-controlled. A1c slightly elevated at 7.2. No diabetic foot complications. - Continue Actos  and Janumet . - Emphasize dietary management with reduced carbohydrate intake.  Essential hypertension Blood pressure well-controlled at 129/67. Previous dizziness resolved with medication adjustment. - Continue amlodipine /benazepril  at 2.5/10 mg.  Hyperlipidemia Cholesterol levels well-managed with atorvastatin . - Continue atorvastatin .  General Health Maintenance Received flu vaccination. Advised on hand hygiene. - Administer flu shot. - Advise on regular hand washing.          Follow up plan: Return in about 3 months (around 12/21/2023), or if symptoms worsen or fail to improve, for Diabetes recheck.  Counseling provided for all of the vaccine components Orders Placed This Encounter  Procedures   Flu vaccine HIGH DOSE PF(Fluzone Trivalent)   Bayer DCA Hb A1c Waived   CBC with Differential/Platelet   CMP14+EGFR   Lipid panel    Fonda Levins, MD [facility] 09/21/2023, 8:54 AM

## 2023-09-22 LAB — CBC WITH DIFFERENTIAL/PLATELET
Basophils Absolute: 0 x10E3/uL (ref 0.0–0.2)
Basos: 1 %
EOS (ABSOLUTE): 0.2 x10E3/uL (ref 0.0–0.4)
Eos: 3 %
Hematocrit: 39.9 % (ref 37.5–51.0)
Hemoglobin: 12.6 g/dL — ABNORMAL LOW (ref 13.0–17.7)
Immature Grans (Abs): 0 x10E3/uL (ref 0.0–0.1)
Immature Granulocytes: 0 %
Lymphocytes Absolute: 1.7 x10E3/uL (ref 0.7–3.1)
Lymphs: 33 %
MCH: 29.1 pg (ref 26.6–33.0)
MCHC: 31.6 g/dL (ref 31.5–35.7)
MCV: 92 fL (ref 79–97)
Monocytes Absolute: 0.5 x10E3/uL (ref 0.1–0.9)
Monocytes: 11 %
Neutrophils Absolute: 2.6 x10E3/uL (ref 1.4–7.0)
Neutrophils: 52 %
Platelets: 225 x10E3/uL (ref 150–450)
RBC: 4.33 x10E6/uL (ref 4.14–5.80)
RDW: 12.8 % (ref 11.6–15.4)
WBC: 5 x10E3/uL (ref 3.4–10.8)

## 2023-09-22 LAB — LIPID PANEL
Cholesterol, Total: 114 mg/dL (ref 100–199)
HDL: 48 mg/dL (ref >39)
LDL CALC COMMENT:: 2.4 ratio (ref 0.0–5.0)
LDL Chol Calc (NIH): 52 mg/dL (ref 0–99)
Triglycerides: 68 mg/dL (ref 0–149)
VLDL Cholesterol Cal: 14 mg/dL (ref 5–40)

## 2023-09-22 LAB — CMP14+EGFR
ALT: 14 IU/L (ref 0–44)
AST: 20 IU/L (ref 0–40)
Albumin: 3.9 g/dL (ref 3.8–4.8)
Alkaline Phosphatase: 59 IU/L (ref 44–121)
BUN/Creatinine Ratio: 17 (ref 10–24)
BUN: 24 mg/dL (ref 8–27)
Bilirubin Total: 0.3 mg/dL (ref 0.0–1.2)
CO2: 22 mmol/L (ref 20–29)
Calcium: 9.2 mg/dL (ref 8.6–10.2)
Chloride: 103 mmol/L (ref 96–106)
Creatinine, Ser: 1.4 mg/dL — ABNORMAL HIGH (ref 0.76–1.27)
Globulin, Total: 2.5 g/dL (ref 1.5–4.5)
Glucose: 112 mg/dL — ABNORMAL HIGH (ref 70–99)
Potassium: 4.5 mmol/L (ref 3.5–5.2)
Sodium: 139 mmol/L (ref 134–144)
Total Protein: 6.4 g/dL (ref 6.0–8.5)
eGFR: 53 mL/min/1.73 — ABNORMAL LOW (ref >59)

## 2023-09-28 ENCOUNTER — Ambulatory Visit: Payer: Self-pay | Admitting: Family Medicine

## 2023-11-09 DIAGNOSIS — H5203 Hypermetropia, bilateral: Secondary | ICD-10-CM | POA: Diagnosis not present

## 2023-11-09 LAB — OPHTHALMOLOGY REPORT-SCANNED

## 2023-11-11 ENCOUNTER — Telehealth: Admitting: Physician Assistant

## 2023-11-11 DIAGNOSIS — J069 Acute upper respiratory infection, unspecified: Secondary | ICD-10-CM

## 2023-11-11 DIAGNOSIS — B9689 Other specified bacterial agents as the cause of diseases classified elsewhere: Secondary | ICD-10-CM | POA: Diagnosis not present

## 2023-11-11 MED ORDER — BENZONATATE 100 MG PO CAPS: 100.0000 mg | ORAL_CAPSULE | Freq: Three times a day (TID) | ORAL | 0 refills | Status: AC | PRN

## 2023-11-11 MED ORDER — AZITHROMYCIN 250 MG PO TABS: ORAL_TABLET | ORAL | 0 refills | Status: AC

## 2023-11-11 NOTE — Progress Notes (Signed)
 We are sorry that you are not feeling well.  Here is how we plan to help!  Based on your presentation I believe you most likely have A cough due to bacteria.  When patients have a fever and a productive cough with a change in color or increased sputum production, we are concerned about bacterial bronchitis.  If left untreated it can progress to pneumonia.  If your symptoms do not improve with your treatment plan it is important that you contact your provider.   I have prescribed Azithromyin 250 mg: two tablets now and then one tablet daily for 4 additonal days    In addition you may use A prescription cough medication called Tessalon Perles 100mg . You may take 1-2 capsules every 8 hours as needed for your cough.  From your responses in the eVisit questionnaire you describe inflammation in the upper respiratory tract which is causing a significant cough.  This is commonly called Bronchitis and has four common causes:   Allergies Viral Infections Acid Reflux Bacterial Infection Allergies, viruses and acid reflux are treated by controlling symptoms or eliminating the cause. An example might be a cough caused by taking certain blood pressure medications. You stop the cough by changing the medication. Another example might be a cough caused by acid reflux. Controlling the reflux helps control the cough.     HOME CARE Only take medications as instructed by your medical team. Complete the entire course of an antibiotic. Drink plenty of fluids and get plenty of rest. Avoid close contacts especially the very young and the elderly Cover your mouth if you cough or cough into your sleeve. Always remember to wash your hands A steam or ultrasonic humidifier can help congestion.   GET HELP RIGHT AWAY IF: You develop worsening fever. You become short of breath You cough up blood. Your symptoms persist after you have completed your treatment plan MAKE SURE YOU  Understand these instructions. Will watch  your condition. Will get help right away if you are not doing well or get worse.  Your e-visit answers were reviewed by a board certified advanced clinical practitioner to complete your personal care plan.  Depending on the condition, your plan could have included both over the counter or prescription medications. If there is a problem please reply  once you have received a response from your provider. Your safety is important to us .  If you have drug allergies check your prescription carefully.    You can use MyChart to ask questions about today's visit, request a non-urgent call back, or ask for a work or school excuse for 24 hours related to this e-Visit. If it has been greater than 24 hours you will need to follow up with your provider, or enter a new e-Visit to address those concerns. You will get an e-mail in the next two days asking about your experience.  I hope that your e-visit has been valuable and will speed your recovery. Thank you for using e-visits.   I have spent 5 minutes in review of e-visit questionnaire, review and updating patient chart, medical decision making and response to patient.   Delon CHRISTELLA Dickinson, PA-C

## 2023-11-16 DIAGNOSIS — L820 Inflamed seborrheic keratosis: Secondary | ICD-10-CM | POA: Diagnosis not present

## 2023-11-16 DIAGNOSIS — I781 Nevus, non-neoplastic: Secondary | ICD-10-CM | POA: Diagnosis not present

## 2023-11-16 DIAGNOSIS — D225 Melanocytic nevi of trunk: Secondary | ICD-10-CM | POA: Diagnosis not present

## 2023-11-16 DIAGNOSIS — L814 Other melanin hyperpigmentation: Secondary | ICD-10-CM | POA: Diagnosis not present

## 2023-11-16 DIAGNOSIS — L821 Other seborrheic keratosis: Secondary | ICD-10-CM | POA: Diagnosis not present

## 2023-11-16 DIAGNOSIS — L905 Scar conditions and fibrosis of skin: Secondary | ICD-10-CM | POA: Diagnosis not present

## 2023-11-16 DIAGNOSIS — Z85828 Personal history of other malignant neoplasm of skin: Secondary | ICD-10-CM | POA: Diagnosis not present

## 2023-12-28 ENCOUNTER — Ambulatory Visit: Payer: Self-pay | Admitting: Family Medicine

## 2023-12-28 ENCOUNTER — Encounter: Payer: Self-pay | Admitting: Family Medicine

## 2023-12-28 VITALS — BP 133/66 | HR 80 | Ht 71.0 in | Wt 195.0 lb

## 2023-12-28 DIAGNOSIS — Z7984 Long term (current) use of oral hypoglycemic drugs: Secondary | ICD-10-CM | POA: Diagnosis not present

## 2023-12-28 DIAGNOSIS — I10 Essential (primary) hypertension: Secondary | ICD-10-CM | POA: Diagnosis not present

## 2023-12-28 DIAGNOSIS — E1169 Type 2 diabetes mellitus with other specified complication: Secondary | ICD-10-CM

## 2023-12-28 DIAGNOSIS — N401 Enlarged prostate with lower urinary tract symptoms: Secondary | ICD-10-CM

## 2023-12-28 DIAGNOSIS — R3912 Poor urinary stream: Secondary | ICD-10-CM

## 2023-12-28 DIAGNOSIS — E0869 Diabetes mellitus due to underlying condition with other specified complication: Secondary | ICD-10-CM

## 2023-12-28 DIAGNOSIS — E785 Hyperlipidemia, unspecified: Secondary | ICD-10-CM

## 2023-12-28 LAB — BAYER DCA HB A1C WAIVED: HB A1C (BAYER DCA - WAIVED): 7.3 % — ABNORMAL HIGH (ref 4.8–5.6)

## 2023-12-28 MED ORDER — ATORVASTATIN CALCIUM 20 MG PO TABS: 10.0000 mg | ORAL_TABLET | Freq: Every day | ORAL | 3 refills | Status: AC

## 2023-12-28 MED ORDER — JANUMET 50-1000 MG PO TABS: 1.0000 | ORAL_TABLET | Freq: Two times a day (BID) | ORAL | 3 refills | Status: AC

## 2023-12-28 MED ORDER — PIOGLITAZONE HCL 30 MG PO TABS: 30.0000 mg | ORAL_TABLET | Freq: Every day | ORAL | 3 refills | Status: AC

## 2023-12-28 MED ORDER — ACCU-CHEK GUIDE TEST VI STRP: ORAL_STRIP | 3 refills | Status: AC

## 2023-12-28 MED ORDER — TAMSULOSIN HCL 0.4 MG PO CAPS: 0.4000 mg | ORAL_CAPSULE | Freq: Every day | ORAL | 3 refills | Status: AC

## 2023-12-28 MED ORDER — AMLODIPINE BESY-BENAZEPRIL HCL 2.5-10 MG PO CAPS: 1.0000 | ORAL_CAPSULE | Freq: Every day | ORAL | 3 refills | Status: AC

## 2023-12-28 NOTE — Progress Notes (Signed)
 BP 133/66   Pulse 80   Ht 5' 11 (1.803 m)   Wt 195 lb (88.5 kg)   SpO2 98%   BMI 27.20 kg/m    Subjective:   Patient ID: Tony Hodges, male    DOB: 05-May-1950, 73 y.o.   MRN: [id number]  HPI: Tony Hodges is a 73 y.o. male presenting on 12/28/2023 for Medical Management of Chronic Issues, Diabetes, Hyperlipidemia, and Hypertension   Discussed the use of AI scribe software for clinical note transcription with the patient, who gave verbal consent to proceed.  History of Present Illness   Tony Hodges is a 73 year old male who presents for a recheck of his diabetes management.  Glycemic control - Thirty-day blood glucose average is 118 mg/dL. - Ninety-day blood glucose average is 121 mg/dL. - Currently taking Janumet  and Actos  daily. - Aims to maintain HbA1c around 7%; previous HbA1c was 7.2%.  Blood pressure management - Recent blood pressure reading of 133/66 mmHg while on current antihypertensive regimen.  Lower urinary tract symptoms - Taking Flomax  for prostate issues. - Urinates every two hours during the day. - Nocturia, getting up three times per night.  Hyperlipidemia management - Takes half a pill of cholesterol medication daily. - Cholesterol levels were within normal limits at last check.  Recent respiratory infection - Bronchitis approximately six weeks ago, treated with medication via virtual visit. - Symptoms resolved over a couple of weeks. - Received influenza vaccination.  Urinalysis screening - Received urinalysis test kit from insurance company. - Plans to complete urinalysis today.  General symptoms - No current pain, swelling, or abdominal pain.          Relevant past medical, surgical, family and social history reviewed and updated as indicated. Interim medical history since our last visit reviewed. Allergies and medications reviewed and updated.  Review of Systems  Constitutional:  Negative for chills and fever.  Eyes:   Negative for visual disturbance.  Respiratory:  Negative for shortness of breath and wheezing.   Cardiovascular:  Negative for chest pain and leg swelling.  Genitourinary:  Positive for frequency.  Musculoskeletal:  Negative for back pain and gait problem.  Skin:  Negative for rash.  Neurological:  Negative for dizziness and light-headedness.  All other systems reviewed and are negative.   Per HPI unless specifically indicated above   Allergies as of 12/28/2023   No Known Allergies      Medication List        Accurate as of December 28, 2023 10:39 AM. If you have any questions, ask your nurse or doctor.          Accu-Chek Guide Test test strip Generic drug: glucose blood Use as instructed What changed: See the new instructions. Changed by: Fonda Levins, MD   Accu-Chek Guide w/Device Kit 1 each by Does not apply route 2 (two) times daily as needed.   amlodipine -benazepril  2.5-10 MG capsule Commonly known as: LOTREL Take 1 capsule by mouth daily.   aspirin EC 81 MG tablet Take 81 mg by mouth daily.   atorvastatin  20 MG tablet Commonly known as: LIPITOR Take 0.5 tablets (10 mg total) by mouth at bedtime.   benzonatate  100 MG capsule Commonly known as: TESSALON  Take 1 capsule (100 mg total) by mouth 3 (three) times daily as needed.   cyanocobalamin 500 MCG tablet Commonly known as: VITAMIN B12 Take 500 mcg by mouth daily.   diclofenac  sodium 1 % Gel Commonly known as: VOLTAREN   APPLY 2 GRAMS TO AFFECTED AREA 4 TIMES A DAY   Janumet  50-1000 MG tablet Generic drug: sitaGLIPtin -metformin  Take 1 tablet by mouth 2 (two) times daily with a meal.   multivitamin capsule Take 1 capsule by mouth daily.   pioglitazone  30 MG tablet Commonly known as: ACTOS  Take 1 tablet (30 mg total) by mouth daily.   tamsulosin  0.4 MG Caps capsule Commonly known as: FLOMAX  Take 1 capsule (0.4 mg total) by mouth daily.         Objective:   BP 133/66   Pulse 80    Ht 5' 11 (1.803 m)   Wt 195 lb (88.5 kg)   SpO2 98%   BMI 27.20 kg/m   Wt Readings from Last 3 Encounters:  12/28/23 195 lb (88.5 kg)  09/21/23 195 lb (88.5 kg)  06/16/23 192 lb (87.1 kg)    Physical Exam Vitals and nursing note reviewed.  Constitutional:      General: He is not in acute distress.    Appearance: He is well-developed. He is not diaphoretic.  Eyes:     General: No scleral icterus.    Conjunctiva/sclera: Conjunctivae normal.  Neck:     Thyroid: No thyromegaly.  Cardiovascular:     Rate and Rhythm: Normal rate and regular rhythm.     Heart sounds: Normal heart sounds. No murmur heard. Pulmonary:     Effort: Pulmonary effort is normal. No respiratory distress.     Breath sounds: Normal breath sounds. No wheezing.  Musculoskeletal:        General: No swelling. Normal range of motion.     Cervical back: Neck supple.  Lymphadenopathy:     Cervical: No cervical adenopathy.  Skin:    General: Skin is warm and dry.     Findings: No rash.  Neurological:     Mental Status: He is alert and oriented to person, place, and time.     Coordination: Coordination normal.  Psychiatric:        Behavior: Behavior normal.    Physical Exam   VITALS: BP- 133/66 CHEST: Lungs clear to auscultation bilaterally. CARDIOVASCULAR: Regular rate and rhythm, no murmurs. EXTREMITIES: No edema, good peripheral pulses.       Results for orders placed or performed in visit on 11/14/23  OPHTHALMOLOGY REPORT-SCANNED   Collection Time: 11/09/23  3:28 PM  Result Value Ref Range   HM Diabetic Eye Exam Retinopathy (A) No Retinopathy   A Comment      Assessment & Plan:   Problem List Items Addressed This Visit       Cardiovascular and Mediastinum   HTN (hypertension)   Relevant Medications   amlodipine -benazepril  (LOTREL) 2.5-10 MG capsule   atorvastatin  (LIPITOR) 20 MG tablet   Other Relevant Orders   CBC with Differential/Platelet   Bayer DCA Hb A1c Waived   TSH   Vitamin  B12     Endocrine   Hyperlipidemia associated with type 2 diabetes mellitus (HCC) - Primary   Relevant Medications   amlodipine -benazepril  (LOTREL) 2.5-10 MG capsule   sitaGLIPtin -metformin  (JANUMET ) 50-1000 MG tablet   atorvastatin  (LIPITOR) 20 MG tablet   pioglitazone  (ACTOS ) 30 MG tablet   Other Relevant Orders   CBC with Differential/Platelet   Bayer DCA Hb A1c Waived   TSH   Vitamin B12   Diabetes mellitus (HCC)   Relevant Medications   amlodipine -benazepril  (LOTREL) 2.5-10 MG capsule   sitaGLIPtin -metformin  (JANUMET ) 50-1000 MG tablet   atorvastatin  (LIPITOR) 20 MG tablet   pioglitazone  (ACTOS )  30 MG tablet   Other Relevant Orders   Microalbumin / creatinine urine ratio     Genitourinary   BPH (benign prostatic hyperplasia)   Relevant Medications   tamsulosin  (FLOMAX ) 0.4 MG CAPS capsule      Type 2 diabetes mellitus with hyperlipidemia Diabetes well-controlled with HbA1c of 7.3. Blood glucose stable. Hyperlipidemia managed with satisfactory cholesterol levels. - Continue Janumet  and Actos  daily. - Encouraged increased physical activity despite winter weather.  Primary hypertension Blood pressure well-controlled at 133/66 with current medication. - Continue current antihypertensive medication.  Benign prostatic hyperplasia with lower urinary tract symptoms Experiencing urinary frequency. Discussed surgical intervention but not ready. Procedure involves transurethral resection of the prostate. - Continue Flomax . - Consider surgical intervention if symptoms worsen.  General health maintenance Due for Medicare wellness check. Recent flu vaccination received. Discussed hand hygiene importance. - Schedule Medicare wellness check with nurse over the phone. - Perform urinalysis today. - Encouraged hand hygiene to prevent infections.          Follow up plan: Return in about 3 months (around 03/27/2024), or if symptoms worsen or fail to improve, for Physical exam and  diabetes, also needs annual wellness visit.  Counseling provided for all of the vaccine components Orders Placed This Encounter  Procedures   CBC with Differential/Platelet   Bayer DCA Hb A1c Waived   TSH   Vitamin B12   Microalbumin / creatinine urine ratio    Fonda Levins, MD [facility] 12/28/2023, 10:39 AM

## 2023-12-29 LAB — CBC WITH DIFFERENTIAL/PLATELET
Basophils Absolute: 0.1 x10E3/uL (ref 0.0–0.2)
Basos: 1 %
EOS (ABSOLUTE): 0.1 x10E3/uL (ref 0.0–0.4)
Eos: 2 %
Hematocrit: 40.6 % (ref 37.5–51.0)
Hemoglobin: 12.4 g/dL — ABNORMAL LOW (ref 13.0–17.7)
Immature Grans (Abs): 0 x10E3/uL (ref 0.0–0.1)
Immature Granulocytes: 0 %
Lymphocytes Absolute: 1.4 x10E3/uL (ref 0.7–3.1)
Lymphs: 28 %
MCH: 27.6 pg (ref 26.6–33.0)
MCHC: 30.5 g/dL — ABNORMAL LOW (ref 31.5–35.7)
MCV: 90 fL (ref 79–97)
Monocytes Absolute: 0.4 x10E3/uL (ref 0.1–0.9)
Monocytes: 8 %
Neutrophils Absolute: 3.2 x10E3/uL (ref 1.4–7.0)
Neutrophils: 61 %
Platelets: 255 x10E3/uL (ref 150–450)
RBC: 4.5 x10E6/uL (ref 4.14–5.80)
RDW: 13.5 % (ref 11.6–15.4)
WBC: 5.2 x10E3/uL (ref 3.4–10.8)

## 2023-12-29 LAB — MICROALBUMIN / CREATININE URINE RATIO
Creatinine, Urine: 160.2 mg/dL
Microalb/Creat Ratio: 3 mg/g{creat} (ref 0–29)
Microalbumin, Urine: 4.2 ug/mL

## 2023-12-29 LAB — TSH: TSH: 1.62 u[IU]/mL (ref 0.450–4.500)

## 2023-12-29 LAB — VITAMIN B12: Vitamin B-12: 370 pg/mL (ref 232–1245)

## 2024-01-03 ENCOUNTER — Ambulatory Visit

## 2024-02-17 ENCOUNTER — Ambulatory Visit

## 2024-02-17 VITALS — BP 133/66 | HR 80 | Ht 71.0 in | Wt 195.0 lb

## 2024-02-17 DIAGNOSIS — Z0000 Encounter for general adult medical examination without abnormal findings: Secondary | ICD-10-CM

## 2024-02-17 NOTE — Progress Notes (Addendum)
 "  Chief Complaint  Patient presents with   Medicare Wellness     Subjective:   Tony Hodges is a 74 y.o. male who presents for a Medicare Annual Wellness Visit.  Visit info / Clinical Intake: Medicare Wellness Visit Type:: Initial Annual Wellness Visit Persons participating in visit and providing information:: patient Medicare Wellness Visit Mode:: Telephone If telephone:: video declined Since this visit was completed virtually, some vitals may be partially provided or unavailable. Missing vitals are due to the limitations of the virtual format.: Unable to obtain vitals - no equipment If Telephone or Video please confirm:: I connected with patient using audio/video enable telemedicine. I verified patient identity with two identifiers, discussed telehealth limitations, and patient agreed to proceed. Patient Location:: home Provider Location:: office Interpreter Needed?: No Pre-visit prep was completed: yes AWV questionnaire completed by patient prior to visit?: yes Date:: 02/15/24 (per pt) Living arrangements:: (Patient-Rptd) lives with spouse/significant other Patient's Overall Health Status Rating: (Patient-Rptd) good Typical amount of pain: (Patient-Rptd) none Does pain affect daily life?: (Patient-Rptd) no Are you currently prescribed opioids?: no  Dietary Habits and Nutritional Risks How many meals a day?: (Patient-Rptd) 3 Eats fruit and vegetables daily?: (Patient-Rptd) yes Most meals are obtained by: (Patient-Rptd) preparing own meals In the last 2 weeks, have you had any of the following?: none Diabetic:: (!) yes Any non-healing wounds?: no How often do you check your BS?: 1 Would you like to be referred to a Nutritionist or for Diabetic Management? : no  Functional Status Activities of Daily Living (to include ambulation/medication): (Patient-Rptd) Independent Ambulation: (Patient-Rptd) Independent Medication Administration: (Patient-Rptd) Independent Home  Management (perform basic housework or laundry): (Patient-Rptd) Independent Manage your own finances?: (Patient-Rptd) yes Primary transportation is: (Patient-Rptd) driving Concerns about vision?: no *vision screening is required for WTM* (2025) Concerns about hearing?: (Patient-Rptd) no  Fall Screening Falls in the past year?: (Patient-Rptd) 0 Number of falls in past year: 0 Was there an injury with Fall?: 0 Fall Risk Category Calculator: 0 Patient Fall Risk Level: Low Fall Risk  Fall Risk Patient at Risk for Falls Due to: No Fall Risks Fall risk Follow up: Falls evaluation completed; Education provided  Home and Transportation Safety: All rugs have non-skid backing?: (Patient-Rptd) yes All stairs or steps have railings?: (Patient-Rptd) yes Grab bars in the bathtub or shower?: (Patient-Rptd) yes Have non-skid surface in bathtub or shower?: (Patient-Rptd) yes Good home lighting?: (Patient-Rptd) yes Regular seat belt use?: (Patient-Rptd) yes Hospital stays in the last year:: (Patient-Rptd) no  Cognitive Assessment Difficulty concentrating, remembering, or making decisions? : (Patient-Rptd) no Will 6CIT or Mini Cog be Completed: yes What year is it?: 0 points What month is it?: 0 points Give patient an address phrase to remember (5 components): [address] [place], [place] About what time is it?: 0 points Count backwards from 20 to 1: 0 points Say the months of the year in reverse: 0 points Repeat the address phrase from earlier: 0 points 6 CIT Score: 0 points  Advance Directives (For Healthcare) Does Patient Have a Medical Advance Directive?: No Would patient like information on creating a medical advance directive?: No - Patient declined  Reviewed/Updated  Reviewed/Updated: Reviewed All (Medical, Surgical, Family, Medications, Allergies, Care Teams, Patient Goals)    Allergies (verified) Patient has no known allergies.   Current Medications (verified) Outpatient  Encounter Medications as of 02/17/2024  Medication Sig   amlodipine -benazepril  (LOTREL) 2.5-10 MG capsule Take 1 capsule by mouth daily.   aspirin 81 MG EC  tablet Take 81 mg by mouth daily.   atorvastatin  (LIPITOR) 20 MG tablet Take 0.5 tablets (10 mg total) by mouth at bedtime.   Blood Glucose Monitoring Suppl (ACCU-CHEK GUIDE) w/Device KIT 1 each by Does not apply route 2 (two) times daily as needed.   cyanocobalamin (VITAMIN B12) 500 MCG tablet Take 500 mcg by mouth daily.   diclofenac  sodium (VOLTAREN ) 1 % GEL APPLY 2 GRAMS TO AFFECTED AREA 4 TIMES A DAY   glucose blood (ACCU-CHEK GUIDE TEST) test strip Use as instructed   Multiple Vitamin (MULTIVITAMIN) capsule Take 1 capsule by mouth daily.   pioglitazone  (ACTOS ) 30 MG tablet Take 1 tablet (30 mg total) by mouth daily.   sitaGLIPtin -metformin  (JANUMET ) 50-1000 MG tablet Take 1 tablet by mouth 2 (two) times daily with a meal.   tamsulosin  (FLOMAX ) 0.4 MG CAPS capsule Take 1 capsule (0.4 mg total) by mouth daily.   benzonatate  (TESSALON ) 100 MG capsule Take 1 capsule (100 mg total) by mouth 3 (three) times daily as needed. (Patient not taking: Reported on 02/17/2024)   No facility-administered encounter medications on file as of 02/17/2024.    History: Past Medical History:  Diagnosis Date   Diabetes mellitus    Erectile dysfunction    Hyperlipidemia    Hypertension    Past Surgical History:  Procedure Laterality Date   COLONOSCOPY  04/27/2012   Dr. Avram   SHOULDER SURGERY Right    UMBILICAL HERNIA REPAIR  2010   Family History  Problem Relation Age of Onset   Heart attack Father 83   Colon cancer Neg Hx    Esophageal cancer Neg Hx    Rectal cancer Neg Hx    Stomach cancer Neg Hx    Colon polyps Neg Hx    Social History   Occupational History   Not on file  Tobacco Use   Smoking status: Former    Current packs/day: 0.00    Types: Cigarettes    Quit date: 09/05/1977    Years since quitting: 46.4   Smokeless tobacco:  Never  Vaping Use   Vaping status: Never Used  Substance and Sexual Activity   Alcohol use: Yes    Alcohol/week: 2.0 standard drinks of alcohol    Types: 2 Glasses of wine per week   Drug use: No   Sexual activity: Not on file   Tobacco Counseling Counseling given: Yes  SDOH Screenings   Food Insecurity: No Food Insecurity (02/17/2024)  Housing: Low Risk (02/17/2024)  Transportation Needs: No Transportation Needs (02/17/2024)  Utilities: Not At Risk (02/17/2024)  Alcohol Screen: Low Risk (12/26/2023)  Depression (PHQ2-9): Low Risk (02/17/2024)  Financial Resource Strain: Low Risk (12/26/2023)  Physical Activity: Inactive (02/17/2024)  Social Connections: Socially Integrated (02/17/2024)  Stress: No Stress Concern Present (02/17/2024)  Tobacco Use: Medium Risk (02/17/2024)  Health Literacy: Adequate Health Literacy (02/17/2024)   See flowsheets for full screening details  Depression Screen PHQ 2 & 9 Depression Scale- Over the past 2 weeks, how often have you been bothered by any of the following problems? Little interest or pleasure in doing things: 0 Feeling down, depressed, or hopeless (PHQ Adolescent also includes...irritable): 0 PHQ-2 Total Score: 0 Trouble falling or staying asleep, or sleeping too much: 0 Feeling tired or having little energy: 0 Poor appetite or overeating (PHQ Adolescent also includes...weight loss): 0 Feeling bad about yourself - or that you are a failure or have let yourself or your family down: 0 Trouble concentrating on things, such as reading  the newspaper or watching television [facility] Adolescent also includes...like school work): 0 Moving or speaking so slowly that other people could have noticed. Or the opposite - being so fidgety or restless that you have been moving around a lot more than usual: 0 Thoughts that you would be better off dead, or of hurting yourself in some way: 0 PHQ-9 Total Score: 0 If you checked off any problems, how difficult have these problems  made it for you to do your work, take care of things at home, or get along with other people?: Not difficult at all     Goals Addressed             This Visit's Progress    Stay Active and Independent-Low Back Pain               Objective:    Today's Vitals   02/17/24 1012  BP: 133/66  Pulse: 80  Weight: 195 lb (88.5 kg)  Height: 5' 11 (1.803 m)   Body mass index is 27.2 kg/m.  Hearing/Vision screen No results found. Immunizations and Health Maintenance Health Maintenance  Topic Date Due   COVID-19 Vaccine (5 - 2025-26 season) 09/12/2023   Diabetic kidney evaluation - Urine ACR  06/27/2024   HEMOGLOBIN A1C  06/27/2024   Diabetic kidney evaluation - eGFR measurement  09/20/2024   FOOT EXAM  09/20/2024   OPHTHALMOLOGY EXAM  11/08/2024   Medicare Annual Wellness (AWV)  02/16/2025   DTaP/Tdap/Td (3 - Td or Tdap) 01/03/2028   Pneumococcal Vaccine: 50+ Years  Completed   Influenza Vaccine  Completed   Hepatitis C Screening  Completed   Zoster Vaccines- Shingrix  Completed   Meningococcal B Vaccine  Aged Out   Colonoscopy  Discontinued        Assessment/Plan:  This is a routine wellness examination for [place].  Patient Care Team: Dettinger, Fonda LABOR, MD as PCP - General (Family Medicine) Debrah Lamar BIRCH, MD (Inactive) as Attending Physician (Gastroenterology) Dr. Norleen Hamilton as Consulting Physician (Optometry)  I have personally reviewed and noted the following in the patients chart:   Medical and social history Use of alcohol, tobacco or illicit drugs  Current medications and supplements including opioid prescriptions. Functional ability and status Nutritional status Physical activity Advanced directives List of other physicians Hospitalizations, surgeries, and ER visits in previous 12 months Vitals Screenings to include cognitive, depression, and falls Referrals and appointments  No orders of the defined types were placed in this encounter.  In  addition, I have reviewed and discussed with patient certain preventive protocols, quality metrics, and best practice recommendations. A written personalized care plan for preventive services as well as general preventive health recommendations were provided to patient.   Ozie Ned, CMA   02/17/2024   Return in 1 year (on 02/16/2025).  After Visit Summary: (MyChart) Due to this being a telephonic visit, the after visit summary with patients personalized plan was offered to patient via MyChart   Nurse Notes: per pt choose to not do the Covid vaccines "

## 2024-02-17 NOTE — Patient Instructions (Signed)
 Tony Hodges,  Thank you for taking the time for your Medicare Wellness Visit. I appreciate your continued commitment to your health goals. Please review the care plan we discussed, and feel free to reach out if I can assist you further.  Please note that Annual Wellness Visits do not include a physical exam. Some assessments may be limited, especially if the visit was conducted virtually. If needed, we may recommend an in-person follow-up with your provider.  Ongoing Care Seeing your primary care provider every 3 to 6 months helps us  monitor your health and provide consistent, personalized care.   Referrals If a referral was made during today's visit and you haven't received any updates within two weeks, please contact the referred provider directly to check on the status.  Recommended Screenings:  Health Maintenance  Topic Date Due   Medicare Annual Wellness Visit  07/20/2018   COVID-19 Vaccine (5 - 2025-26 season) 09/12/2023   Kidney health urinalysis for diabetes  06/27/2024   Hemoglobin A1C  06/27/2024   Yearly kidney function blood test for diabetes  09/20/2024   Complete foot exam   09/20/2024   Eye exam for diabetics  11/08/2024   DTaP/Tdap/Td vaccine (3 - Td or Tdap) 01/03/2028   Pneumococcal Vaccine for age over 22  Completed   Flu Shot  Completed   Hepatitis C Screening  Completed   Zoster (Shingles) Vaccine  Completed   Meningitis B Vaccine  Aged Out   Colon Cancer Screening  Discontinued       02/15/2024    8:46 PM  Advanced Directives  Does Patient Have a Medical Advance Directive? No  Would patient like information on creating a medical advance directive? No - Patient declined    Vision: Annual vision screenings are recommended for early detection of glaucoma, cataracts, and diabetic retinopathy. These exams can also reveal signs of chronic conditions such as diabetes and high blood pressure.  Dental: Annual dental screenings help detect early signs of oral  cancer, gum disease, and other conditions linked to overall health, including heart disease and diabetes.  Please see the attached documents for additional preventive care recommendations.

## 2024-04-06 ENCOUNTER — Encounter: Admitting: Family Medicine
